# Patient Record
Sex: Female | Born: 1945 | Race: White | Hispanic: No | Marital: Single | Smoking: Never smoker
Health system: Southern US, Community
[De-identification: ages and names within clinical notes are randomized; demographics above are authoritative.]

## PROBLEM LIST (undated history)

## (undated) DIAGNOSIS — Z8669 Personal history of other diseases of the nervous system and sense organs: Secondary | ICD-10-CM

## (undated) DIAGNOSIS — M1990 Unspecified osteoarthritis, unspecified site: Secondary | ICD-10-CM

## (undated) DIAGNOSIS — T4145XA Adverse effect of unspecified anesthetic, initial encounter: Secondary | ICD-10-CM

## (undated) DIAGNOSIS — K5792 Diverticulitis of intestine, part unspecified, without perforation or abscess without bleeding: Secondary | ICD-10-CM

## (undated) DIAGNOSIS — G4733 Obstructive sleep apnea (adult) (pediatric): Secondary | ICD-10-CM

## (undated) DIAGNOSIS — Z780 Asymptomatic menopausal state: Secondary | ICD-10-CM

## (undated) DIAGNOSIS — K56600 Partial intestinal obstruction, unspecified as to cause: Secondary | ICD-10-CM

## (undated) DIAGNOSIS — K7460 Unspecified cirrhosis of liver: Secondary | ICD-10-CM

## (undated) DIAGNOSIS — J189 Pneumonia, unspecified organism: Secondary | ICD-10-CM

## (undated) DIAGNOSIS — M549 Dorsalgia, unspecified: Secondary | ICD-10-CM

## (undated) DIAGNOSIS — F329 Major depressive disorder, single episode, unspecified: Secondary | ICD-10-CM

## (undated) DIAGNOSIS — E119 Type 2 diabetes mellitus without complications: Secondary | ICD-10-CM

## (undated) DIAGNOSIS — E785 Hyperlipidemia, unspecified: Secondary | ICD-10-CM

## (undated) DIAGNOSIS — Q2112 Patent foramen ovale: Secondary | ICD-10-CM

## (undated) DIAGNOSIS — I10 Essential (primary) hypertension: Secondary | ICD-10-CM

## (undated) DIAGNOSIS — M109 Gout, unspecified: Secondary | ICD-10-CM

## (undated) DIAGNOSIS — K760 Fatty (change of) liver, not elsewhere classified: Secondary | ICD-10-CM

## (undated) DIAGNOSIS — Q211 Atrial septal defect: Secondary | ICD-10-CM

## (undated) DIAGNOSIS — C541 Malignant neoplasm of endometrium: Secondary | ICD-10-CM

## (undated) DIAGNOSIS — K469 Unspecified abdominal hernia without obstruction or gangrene: Secondary | ICD-10-CM

## (undated) DIAGNOSIS — T8859XA Other complications of anesthesia, initial encounter: Secondary | ICD-10-CM

## (undated) DIAGNOSIS — E559 Vitamin D deficiency, unspecified: Secondary | ICD-10-CM

## (undated) DIAGNOSIS — J45909 Unspecified asthma, uncomplicated: Secondary | ICD-10-CM

## (undated) DIAGNOSIS — T8149XA Infection following a procedure, other surgical site, initial encounter: Secondary | ICD-10-CM

## (undated) DIAGNOSIS — G4730 Sleep apnea, unspecified: Secondary | ICD-10-CM

## (undated) DIAGNOSIS — N950 Postmenopausal bleeding: Secondary | ICD-10-CM

## (undated) DIAGNOSIS — F32A Depression, unspecified: Secondary | ICD-10-CM

## (undated) HISTORY — PX: TUBAL LIGATION: SHX77

## (undated) HISTORY — DX: Depression, unspecified: F32.A

## (undated) HISTORY — DX: Unspecified asthma, uncomplicated: J45.909

## (undated) HISTORY — PX: TONSILLECTOMY: SUR1361

## (undated) HISTORY — DX: Asymptomatic menopausal state: Z78.0

## (undated) HISTORY — DX: Diverticulitis of intestine, part unspecified, without perforation or abscess without bleeding: K57.92

## (undated) HISTORY — PX: GALLBLADDER SURGERY: SHX652

## (undated) HISTORY — DX: Sleep apnea, unspecified: G47.30

## (undated) HISTORY — PX: APPENDECTOMY: SHX54

## (undated) HISTORY — PX: TOTAL KNEE ARTHROPLASTY: SHX125

## (undated) HISTORY — DX: Morbid (severe) obesity due to excess calories: E66.01

## (undated) HISTORY — DX: Major depressive disorder, single episode, unspecified: F32.9

## (undated) HISTORY — PX: EYE SURGERY: SHX253

## (undated) HISTORY — DX: Gout, unspecified: M10.9

## (undated) HISTORY — PX: CHOLECYSTECTOMY: SHX55

## (undated) HISTORY — DX: Obstructive sleep apnea (adult) (pediatric): G47.33

## (undated) HISTORY — DX: Essential (primary) hypertension: I10

## (undated) HISTORY — DX: Dorsalgia, unspecified: M54.9

## (undated) HISTORY — PX: NASAL SEPTUM SURGERY: SHX37

---
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
## 1972-07-16 HISTORY — PX: BREAST SURGERY: SHX581

## 1978-07-16 HISTORY — PX: GASTRIC BYPASS: SHX52

## 2004-01-08 ENCOUNTER — Emergency Department (HOSPITAL_COMMUNITY): Admission: EM | Admit: 2004-01-08 | Discharge: 2004-01-08 | Payer: Self-pay | Admitting: Emergency Medicine

## 2005-07-16 DIAGNOSIS — K5792 Diverticulitis of intestine, part unspecified, without perforation or abscess without bleeding: Secondary | ICD-10-CM

## 2005-07-16 HISTORY — DX: Diverticulitis of intestine, part unspecified, without perforation or abscess without bleeding: K57.92

## 2005-07-16 HISTORY — PX: COLON RESECTION: SHX5231

## 2006-06-24 ENCOUNTER — Emergency Department (HOSPITAL_COMMUNITY): Admission: EM | Admit: 2006-06-24 | Discharge: 2006-06-24 | Payer: Self-pay | Admitting: Emergency Medicine

## 2009-10-26 ENCOUNTER — Ambulatory Visit: Payer: Self-pay | Admitting: Family Medicine

## 2009-10-26 ENCOUNTER — Other Ambulatory Visit: Admission: RE | Admit: 2009-10-26 | Discharge: 2009-10-26 | Payer: Self-pay | Admitting: Family Medicine

## 2010-11-28 NOTE — Assessment & Plan Note (Signed)
Danielle Stephenson, Danielle Stephenson                  ACCOUNT NO.:  [id number]   MEDICAL RECORD NO.:  [id number]          PATIENT TYPE:  POB   LOCATION:  [facility]         FACILITY:  [facility]   PHYSICIAN:  Tinnie Gens, MD        DATE OF BIRTH:  Jul 12, 1946   DATE OF SERVICE:  10/26/2009                                  CLINIC NOTE   CHIEF COMPLAINT:  Yearly exam.   HISTORY OF PRESENT ILLNESS:  The patient is a 65 year old gravida 4,  para 2-0-2-2 who comes in today for a yearly checkup.  She has not been  seen for several years because she lost her job and lost her insurance.  She used to be a labor and delivery nurse at [employer]  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  The patient has a history significant for hypertension  which has been untreated for several months now.  She reports her blood  pressures normally are around the 120s to 130s over 70s.  She has been  menopausal since 1991 and has no postmenopausal bleeding.   PAST MEDICAL HISTORY:  Hypertension, asthma, allergic rhinitis,  diverticulitis, morbid obesity, depression.   PAST SURGICAL HISTORY:  She has had knee surgery to repair torn tendons  followed by total knee replacement on the left.  She had a gastric  bypass x2; one in the 70s, last one in 1980.  She had a colon resection  for diverticulitis with a major wound infection and wound dehiscence  following this in 2007.  She has also undergone bilateral breast  implants.   MEDICATIONS:  She is on gabapentin 300 mg p.o. daily when she remembers  to take it for depression.   ALLERGIES:  PENICILLIN, DEMEROL, TAPE, and LATEX SENSITIVITY.   OBSTETRICAL HISTORY:  She is gravida 4, para 2 with 2 vaginal  deliveries, one aged 1, she gave that child up for adoption and then  she has a 29 year old daughter.  She had one miscarriage between those 2  pregnancies and one termination after the last pregnancy.  She also  has  a adopted child who is 62 years old.   FAMILY HISTORY:  Coronary artery disease in her father and then her  grandmother and father both had throat cancer.  They are both smokers  and her mother had uterine cancer which required hysterectomy many years  ago.  Her mother is still alive.   SOCIAL HISTORY:  The patient works from home.  She is a Engineer, civil (consulting).  She does  not smoke or do any other drugs.   A 14 point review of system reviewed.   Please see GYN history in the chart, but is significant for weight gain,  although she reports poor diet, not working, has not helped with that,  trouble with her ears and her nose is related to allergies and problems  with shortness of breath and in fact, she thinks she might be having  panic attacks but she also is asthmatic and does not have an inhaler at  present.   PHYSICAL EXAMINATION:  GENERAL:  On exam  today, she is 300 plus pounds,  morbidly obese female in no acute distress.  VITAL SIGNS:  Blood pressure is 169/94 today, pulse is 81.  HEENT:  Normocephalic, atraumatic.  Sclerae anicteric.  NECK:  Supple.  Normal thyroid.  LUNGS:  Clear bilaterally.  CV:  Regular rate and rhythm. No rubs, gallops, or murmurs.  ABDOMEN:  Soft, nontender, nondistended, large, well-healed midline  incision noted.  BREASTS:  Symmetric with everted nipples.  No masses.  Her left breast  implant is somewhat firm and adherent to the overlying skin.  The right  implant is soft.  GU:  Normal external female genitalia.  BUS was normal.  Vagina pale  with loss of irrigation.  Cervix is parous without lesions.  Uterus and  adnexa cannot be adequately sized due to body habitus but essentially  was nontender.   IMPRESSION:  1. Gynecologic exam with Pap smear.  2. Hypertension.  3. Morbidly obese.  4. Asthma.  5. Depression.  6. Menopause.   PLAN:  1. We will refill her Benicar 40 with HCTZ 25 one p.o. daily,      gabapentin 300 mg 1 p.o. daily, and albuterol  MDI 2 puffs q.4-6      hours p.r.n., Pap smear today.  2. __________ mammogram scholarship.  The patient also probably      requires blood work given her age and history, but she is without      insurance and is unable to afford that today.  We will follow up as      needed.           ______________________________  Tinnie Gens, MD     TP/MEDQ  D:  10/26/2009  T:  10/27/2009  Job:  [id number]

## 2012-04-22 ENCOUNTER — Ambulatory Visit: Payer: Self-pay | Admitting: Family Medicine

## 2012-05-13 ENCOUNTER — Ambulatory Visit: Payer: Self-pay | Admitting: Family Medicine

## 2012-05-13 DIAGNOSIS — Z01419 Encounter for gynecological examination (general) (routine) without abnormal findings: Secondary | ICD-10-CM

## 2012-09-30 ENCOUNTER — Other Ambulatory Visit: Payer: Self-pay | Admitting: Family Medicine

## 2012-09-30 ENCOUNTER — Other Ambulatory Visit (HOSPITAL_COMMUNITY)
Admission: RE | Admit: 2012-09-30 | Discharge: 2012-09-30 | Disposition: A | Discharge status: Home / Self Care | Payer: BC Managed Care – PPO | Source: Ambulatory Visit | Attending: Family Medicine | Admitting: Family Medicine

## 2012-09-30 DIAGNOSIS — Z0000 Encounter for general adult medical examination without abnormal findings: Secondary | ICD-10-CM | POA: Insufficient documentation

## 2012-10-02 ENCOUNTER — Other Ambulatory Visit: Payer: Self-pay

## 2012-10-02 DIAGNOSIS — Z1231 Encounter for screening mammogram for malignant neoplasm of breast: Secondary | ICD-10-CM

## 2012-10-02 DIAGNOSIS — Z9882 Breast implant status: Secondary | ICD-10-CM

## 2012-10-21 ENCOUNTER — Ambulatory Visit
Admission: RE | Admit: 2012-10-21 | Discharge: 2012-10-21 | Disposition: A | Discharge status: Home / Self Care | Payer: BC Managed Care – PPO | Source: Ambulatory Visit

## 2012-10-21 DIAGNOSIS — Z1231 Encounter for screening mammogram for malignant neoplasm of breast: Secondary | ICD-10-CM

## 2012-10-21 DIAGNOSIS — Z9882 Breast implant status: Secondary | ICD-10-CM

## 2012-12-31 ENCOUNTER — Encounter: Payer: Self-pay | Admitting: Neurology

## 2012-12-31 ENCOUNTER — Ambulatory Visit (INDEPENDENT_AMBULATORY_CARE_PROVIDER_SITE_OTHER): Payer: BC Managed Care – PPO | Admitting: Neurology

## 2012-12-31 VITALS — BP 136/76 | HR 78 | Temp 98.7°F | Resp 17 | Ht 65.0 in | Wt 366.0 lb

## 2012-12-31 DIAGNOSIS — G4733 Obstructive sleep apnea (adult) (pediatric): Secondary | ICD-10-CM

## 2012-12-31 DIAGNOSIS — G4730 Sleep apnea, unspecified: Secondary | ICD-10-CM | POA: Insufficient documentation

## 2012-12-31 DIAGNOSIS — J45909 Unspecified asthma, uncomplicated: Secondary | ICD-10-CM

## 2012-12-31 DIAGNOSIS — R351 Nocturia: Secondary | ICD-10-CM
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
## 2012-12-31 NOTE — Patient Instructions (Signed)
Consider the 2 and 5 diet by Antonietta Barcelona  fExercise to Lose Weight Exercise and a healthy diet may help you lose weight. Your doctor may suggest specific exercises. EXERCISE IDEAS AND TIPS  Choose low-cost things you enjoy doing, such as walking, bicycling, or exercising to workout videos.  Take stairs instead of the elevator.  Walk during your lunch break.  Park your car further away from work or school.  Go to a gym or an exercise class.  Start with 5 to 10 minutes of exercise each day. Build up to 30 minutes of exercise 4 to 6 days a week.  Wear shoes with good support and comfortable clothes.  Stretch before and after working out.  Work out until you breathe harder and your heart beats faster.  Drink extra water when you exercise.  Do not do so much that you hurt yourself, feel dizzy, or get very short of breath. Exercises that burn about 150 calories:  Running 1  miles in 15 minutes.  Playing volleyball for 45 to 60 minutes.  Washing and waxing a car for 45 to 60 minutes.  Playing touch football for 45 minutes.  Walking 1  miles in 35 minutes.  Pushing a stroller 1  miles in 30 minutes.  Playing basketball for 30 minutes.  Raking leaves for 30 minutes.  Bicycling 5 miles in 30 minutes.  Walking 2 miles in 30 minutes.  Dancing for 30 minutes.  Shoveling snow for 15 minutes.  Swimming laps for 20 minutes.  Walking up stairs for 15 minutes.  Bicycling 4 miles in 15 minutes.  Gardening for 30 to 45 minutes.  Jumping rope for 15 minutes.  Washing windows or floors for 45 to 60 minutes. Document Released: 08/04/2010 Document Revised: 09/24/2011 Document Reviewed: 08/04/2010 [facility] Patient Information 2014 [place], [place]. or weight loss.   After you read titration study we will meet with an 8 weeks off the study a CPAP machine will be provided within days after the study. [facility] we'll download the data that also include a residual  AHI.  CPAP and BIPAP CPAP and BIPAP are methods of helping you breathe. CPAP stands for "continuous positive airway pressure." BIPAP stands for "bi-level positive airway pressure." Both CPAP and BIPAP are provided by a small machine with a flexible plastic tube that attaches to a plastic mask that goes over your nose or mouth. Air is blown into your air passages through your nose or mouth. This helps to keep your airways open and helps to keep you breathing well. The amount of pressure that is used to blow the air into your air passages can be set on the machine. The pressure setting is based on your needs. With CPAP, the amount of pressure stays the same while you breathe in and out. With BIPAP, the amount of pressure changes when you inhale and exhale. Your caregiver will recommend whether CPAP or BIPAP would be more helpful for you.  CPAP and BIPAP can be helpful for both adults and children with:  Sleep apnea.  Chronic Obstructive Pulmonary Disease (COPD), a condition like emphysema.  Diseases which weaken the muscles of the chest such as muscular dystrophy or neurological diseases.  Other problems that cause breathing to be weak or difficult. USE OF CPAP OR BIPAP The respiratory therapist or technician will help you get used to wearing the mask. Some people feel claustrophobic (a trapped or closed in feeling) at first, because the mask needs to be fairly snug  on your face.   It may help you to get used to the mask gradually, by first holding the mask loosely over your nose or mouth using a low pressure setting on the machine. Gradually the mask can be applied more snugly with increased pressure. You can also gradually increase the amount of time the mask is used.  People with sleep apnea will use the mask and machine at night when they are sleeping. Others, like those with ALS or other breathing difficulties, may need the CPAP or BIPAP all the time.  If the first mask you try does not fit  well, or is uncomfortable, there are other types and sizes that can be tried.  If you tend to breathe through your mouth, a chin strap may be applied to help keep your mouth closed (if you are using a nasal mask).  The CPAP and BIPAP machines have alarms that may sound if the mask comes off or develops a leak.  You should not eat or drink while the CPAP or BIPAP is on. Food or fluids could get pushed into your lungs by the pressure of the CPAP or BIPAP. Sometimes CPAP or BIPAP machines are ordered for home use. If you are going to use the CPAP or BIPAP machine at home, follow these instructions  CPAP or BIPAP machines can be rented or purchased through home health care companies. There are many different brands of machines available. If you rent a machine before purchasing you may find which particular machine works well for you.  Ask questions if there is something you do not understand when picking out your machine.  Place your CPAP or BIPAP machine on a secure table or stand near an electrical outlet.  Know where the On/Off switch is.  Follow your doctor's instructions for how to set the pressure on your machine and when you should use it.  Do not smoke! Tobacco smoke residue can damage the machine. SEEK IMMEDIATE MEDICAL CARE IF:   You have redness or open areas around your nose or mouth.  You have trouble operating the CPAP or BIPAP machine.  You cannot tolerate wearing the CPAP or BIPAP mask.  You have any questions or concerns. Document Released: 03/30/2004 Document Revised: 09/24/2011 Document Reviewed: 06/29/2008 [facility] Patient Information 2014 [place], [place]. Sleep Apnea Sleep apnea is disorder that affects a person's sleep. A person with sleep apnea has abnormal pauses in their breathing when they sleep. It is hard for them to get a good sleep. This makes a person tired during the day. It also can lead to other physical problems. There are three types of sleep apnea. One  type is when breathing stops for a short time because your airway is blocked (obstructive sleep apnea). Another type is when the brain sometimes fails to give the normal signal to breathe to the muscles that control your breathing (central sleep apnea). The third type is a combination of the other two types. HOME CARE  Do not sleep on your back. Try to sleep on your side.  Take all medicine as told by your doctor.  Avoid alcohol, calming medicines (sedatives), and depressant drugs.  Try to lose weight if you are overweight. Talk to your doctor about a healthy weight goal. Your doctor may have you use a device that helps to open your airway. It can help you get the air that you need. It is called a positive airway pressure (PAP) device. There are three types of PAP devices:  Continuous positive airway pressure (CPAP) device.  Nasal expiratory positive airway pressure (EPAP) device.  Bilevel positive airway pressure (BPAP) device. MAKE SURE YOU:  Understand these instructions.  Will watch your condition.  Will get help right away if you are not doing well or get worse. Document Released: 04/10/2008 Document Revised: 06/18/2012 Document Reviewed: 11/03/2011 [facility] Patient Information 2014 [place], [place].

## 2013-01-08 ENCOUNTER — Ambulatory Visit (INDEPENDENT_AMBULATORY_CARE_PROVIDER_SITE_OTHER): Payer: BC Managed Care – PPO | Admitting: Neurology

## 2013-01-08 DIAGNOSIS — J45909 Unspecified asthma, uncomplicated: Secondary | ICD-10-CM

## 2013-01-08 DIAGNOSIS — R351 Nocturia: Secondary | ICD-10-CM

## 2013-01-08 DIAGNOSIS — G4733 Obstructive sleep apnea (adult) (pediatric): Secondary | ICD-10-CM

## 2013-01-14 ENCOUNTER — Telehealth: Payer: Self-pay | Admitting: Neurology

## 2013-01-14 DIAGNOSIS — G4733 Obstructive sleep apnea (adult) (pediatric): Secondary | ICD-10-CM

## 2013-01-14 NOTE — Telephone Encounter (Signed)
Patient tolerated CPAP well at 11 cm water,  Currently using 15 - too much,  airfit P10 mask in standart size.  DME is [facility] with andy foster, who referred her .  Called her mobile at 21.30 hours, nobody picked up- please call her ASAP. 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  PCP wendy Mcneill. Marland Kitchen

## 2013-01-20 ENCOUNTER — Encounter: Payer: Self-pay | Admitting: *Deleted

## 2013-01-20 NOTE — Telephone Encounter (Signed)
Called patient to discuss sleep study results from  01/08/2013.  Discussed findings, recommendations and follow up care.  Patient understood well and all questions were answered.    New orders for new CPAP were forwarded to [facility], patient understands they will contact her to arrange for new equipment and supplies.    Copy of study will be sent to patient and also to Dr. [name].  Follow up appt was scheduled with Dr. Vickey Huger for 04/03/2013 at 9:00 AM. -sh

## 2013-01-20 NOTE — Progress Notes (Signed)
See media tab for full report  

## 2013-04-03 ENCOUNTER — Institutional Professional Consult (permissible substitution): Payer: BC Managed Care – PPO | Admitting: Neurology

## 2013-05-06 ENCOUNTER — Institutional Professional Consult (permissible substitution): Payer: BC Managed Care – PPO | Admitting: Neurology

## 2013-10-26 ENCOUNTER — Encounter: Payer: Self-pay | Admitting: Neurology

## 2013-10-27 ENCOUNTER — Encounter: Payer: Self-pay | Admitting: Neurology

## 2013-12-25 ENCOUNTER — Encounter: Payer: Self-pay | Admitting: Neurology

## 2013-12-25 ENCOUNTER — Ambulatory Visit (INDEPENDENT_AMBULATORY_CARE_PROVIDER_SITE_OTHER): Payer: BC Managed Care – PPO | Admitting: Neurology

## 2013-12-25 VITALS — BP 151/63 | HR 82 | Resp 18 | Ht 66.5 in | Wt 370.0 lb

## 2013-12-25 DIAGNOSIS — M109 Gout, unspecified: Secondary | ICD-10-CM

## 2013-12-25 DIAGNOSIS — G4733 Obstructive sleep apnea (adult) (pediatric): Secondary | ICD-10-CM | POA: Insufficient documentation

## 2013-12-25 DIAGNOSIS — Z9989 Dependence on other enabling machines and devices: Secondary | ICD-10-CM

## 2013-12-25 DIAGNOSIS — J45909 Unspecified asthma, uncomplicated: Secondary | ICD-10-CM | POA: Insufficient documentation

## 2013-12-25 NOTE — Patient Instructions (Signed)
Obesity Obesity is having too much body fat and a body mass index (BMI) of 30 or more. BMI is a number based on your height and weight. The number is an estimate of how much body fat you have. Obesity can happen if you eat more calories than you can burn by exercising or other activity. It can cause major health problems or emergencies.  HOME CARE  Exercise and be active as told by your doctor. Try:  Using stairs when you can.  Parking farther away from store doors.  Gardening, biking, or walking.  Eat healthy foods and drinks that are low in calories. Eat more fruits and vegetables.  Limit fast food, sweets, and snack foods that are made with ingredients that are not natural (processed food).  Eat smaller amounts of food.  Keep a journal and write down what you eat every day. Websites can help with this.  Avoid drinking alcohol. Drink more water and drinks without calories.   Take vitamins and dietary pills (supplements) only as told by your doctor.  Try going to weight-loss support groups or classes to help lessen stress. Dieticians and counselors may also help. GET HELP RIGHT AWAY IF:  You have chest pain or tightness.  You have trouble breathing or feel short of breath.  You feel weak or have loss of feeling (numbness) in your legs.  You feel confused or have trouble talking.  You have sudden changes in your vision. MAKE SURE YOU:  Understand these instructions.  Will watch your condition.  Will get help right away if you are not doing well or get worse. Document Released: 09/24/2011 Document Reviewed: 09/24/2011 [facility] Patient Information 2014 [place].
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
## 2014-02-04 ENCOUNTER — Other Ambulatory Visit: Payer: Self-pay | Admitting: Family Medicine

## 2014-02-04 ENCOUNTER — Ambulatory Visit
Admission: RE | Admit: 2014-02-04 | Discharge: 2014-02-04 | Disposition: A | Discharge status: Home / Self Care | Payer: BC Managed Care – PPO | Source: Ambulatory Visit | Attending: Family Medicine | Admitting: Family Medicine

## 2014-02-04 DIAGNOSIS — R1032 Left lower quadrant pain: Secondary | ICD-10-CM

## 2014-02-04 MED ORDER — IOHEXOL 300 MG/ML  SOLN
125.0000 mL | Freq: Once | INTRAMUSCULAR | Status: AC | PRN
  Administered 2014-02-04: 125 mL via INTRAVENOUS

## 2014-05-17 ENCOUNTER — Encounter: Payer: Self-pay | Admitting: Neurology

## 2014-11-04 ENCOUNTER — Other Ambulatory Visit: Payer: Self-pay | Admitting: Otolaryngology

## 2014-11-04 DIAGNOSIS — R42 Dizziness and giddiness: Secondary | ICD-10-CM

## 2014-11-25 ENCOUNTER — Ambulatory Visit
Admission: RE | Admit: 2014-11-25 | Discharge: 2014-11-25 | Disposition: A | Discharge status: Home / Self Care | Payer: Managed Care, Other (non HMO) | Source: Ambulatory Visit | Attending: Otolaryngology | Admitting: Otolaryngology

## 2014-11-25 DIAGNOSIS — R42 Dizziness and giddiness: Secondary | ICD-10-CM

## 2014-11-25 MED ORDER — GADOBENATE DIMEGLUMINE 529 MG/ML IV SOLN
20.0000 mL | Freq: Once | INTRAVENOUS | Status: AC | PRN
  Administered 2014-11-25: 20 mL via INTRAVENOUS

## 2014-11-29 ENCOUNTER — Telehealth: Payer: Self-pay | Admitting: Neurology

## 2014-11-29 NOTE — Telephone Encounter (Signed)
Called pt back. She said she has contacted her PCP Dr. Simeon Craft for a referral for her new symptoms of dizziness to see Dr. Brett Fairy. Encouraged her to call back with any more questions or concerns.

## 2014-11-29 NOTE — Telephone Encounter (Signed)
Patient called and requested to speak with the nurse regarding some issues she has been having (muscle pain, head aches, fatigue). Her PCP suggested that she should  set up an appt within the next few days with her neurologist if at all possible. Informed the patient that she would need a new referral since she is was last seen by Dr. Brett Fairy for sleep apnea, pt stated that she understood and would call her PCP to have a referral faxed. Please call and advise.

## 2014-11-30 ENCOUNTER — Telehealth: Payer: Self-pay

## 2014-11-30 NOTE — Telephone Encounter (Signed)
I spoke to patient to change her appt. She is established Dr. Brett Fairy patient and was put on Dr. Guadelupe Sabin schedule. Patient aware of schedule change.

## 2014-12-01 ENCOUNTER — Institutional Professional Consult (permissible substitution): Payer: Managed Care, Other (non HMO) | Admitting: Neurology

## 2014-12-02 ENCOUNTER — Telehealth: Payer: Self-pay | Admitting: Neurology

## 2014-12-02 ENCOUNTER — Telehealth: Payer: Self-pay

## 2014-12-02 NOTE — Telephone Encounter (Signed)
Patient wants sooner appt with Dr. Brett Fairy. She has appt for 6/6, there has been a couple of openings. I left message to call back for sooner appt.

## 2014-12-02 NOTE — Telephone Encounter (Signed)
Pt called back for sooner appt she was unable to take what was open she will keep the appt for 12/20/14 dg

## 2014-12-02 NOTE — Telephone Encounter (Signed)
I spoke to patient but times available, pt already had other appts.

## 2014-12-02 NOTE — Telephone Encounter (Signed)
I spoke to patient but times that I had available, patient already had other appts.

## 2014-12-14 ENCOUNTER — Encounter: Payer: Self-pay | Admitting: Neurology

## 2014-12-20 ENCOUNTER — Institutional Professional Consult (permissible substitution): Payer: Medicare Other | Admitting: Neurology

## 2014-12-20 ENCOUNTER — Ambulatory Visit (INDEPENDENT_AMBULATORY_CARE_PROVIDER_SITE_OTHER): Payer: Medicare Other | Admitting: Neurology

## 2014-12-20 ENCOUNTER — Encounter: Payer: Self-pay | Admitting: Neurology

## 2014-12-20 VITALS — BP 158/100 | HR 86 | Resp 20 | Ht 65.35 in | Wt 367.0 lb

## 2014-12-20 DIAGNOSIS — Z6841 Body Mass Index (BMI) 40.0 and over, adult: Secondary | ICD-10-CM

## 2014-12-20 DIAGNOSIS — R930 Abnormal findings on diagnostic imaging of skull and head, not elsewhere classified: Secondary | ICD-10-CM

## 2014-12-20 DIAGNOSIS — H8110 Benign paroxysmal vertigo, unspecified ear: Secondary | ICD-10-CM | POA: Insufficient documentation

## 2014-12-20 DIAGNOSIS — G43109 Migraine with aura, not intractable, without status migrainosus: Secondary | ICD-10-CM | POA: Insufficient documentation

## 2014-12-20 DIAGNOSIS — H8112 Benign paroxysmal vertigo, left ear: Secondary | ICD-10-CM

## 2014-12-20 DIAGNOSIS — R110 Nausea: Secondary | ICD-10-CM

## 2014-12-20 MED ORDER — ASPIRIN 75 MG PO CHEW: 75.0000 mg | CHEWABLE_TABLET | Freq: Every day | ORAL | Status: DC

## 2014-12-20 MED ORDER — TOPIRAMATE 25 MG PO TABS: 25.0000 mg | ORAL_TABLET | Freq: Two times a day (BID) | ORAL | Status: DC
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
## 2014-12-20 NOTE — Addendum Note (Signed)
Addended by: Larey Seat on: 12/20/2014 10:28 AM   Modules accepted: Orders

## 2014-12-20 NOTE — Patient Instructions (Signed)
Obesity Obesity is having too much body fat and a body mass index (BMI) of 30 or more. BMI is a number based on your height and weight. The number is an estimate of how much body fat you have. Obesity can happen if you eat more calories than you can burn by exercising or other activity. It can cause major health problems or emergencies.  HOME CARE  Exercise and be active as told by your doctor. Try:  Using stairs when you can.  Parking farther away from store doors.  Gardening, biking, or walking.  Eat healthy foods and drinks that are low in calories. Eat more fruits and vegetables.  Limit fast food, sweets, and snack foods that are made with ingredients that are not natural (processed food).  Eat smaller amounts of food.  Keep a journal and write down what you eat every day. Websites can help with this.  Avoid drinking alcohol. Drink more water and drinks without calories.   Take vitamins and dietary pills (supplements) only as told by your doctor.  Try going to weight-loss support groups or classes to help lessen stress. Dietitians and counselors may also help. GET HELP RIGHT AWAY IF:  You have chest pain or tightness.  You have trouble breathing or feel short of breath.  You feel weak or have loss of feeling (numbness) in your legs.  You feel confused or have trouble talking.  You have sudden changes in your vision. MAKE SURE YOU:  Understand these instructions.  Will watch your condition.  Will get help right away if you are not doing well or get worse. Document Released: 09/24/2011 Document Revised: 11/16/2013 Document Reviewed: 09/24/2011 [facility] Patient Information 2015 [place], [place]. This information is not intended to replace advice given to you by your health care provider. Make sure you discuss any questions you have with your health care provider.

## 2014-12-23 ENCOUNTER — Telehealth: Payer: Self-pay

## 2014-12-23 DIAGNOSIS — R930 Abnormal findings on diagnostic imaging of skull and head, not elsewhere classified: Secondary | ICD-10-CM

## 2014-12-23 DIAGNOSIS — R110 Nausea: Secondary | ICD-10-CM

## 2014-12-23 DIAGNOSIS — Z6841 Body Mass Index (BMI) 40.0 and over, adult: Secondary | ICD-10-CM

## 2014-12-23 DIAGNOSIS — G43109 Migraine with aura, not intractable, without status migrainosus: Secondary | ICD-10-CM

## 2014-12-23 MED ORDER — TOPIRAMATE 25 MG PO TABS: 25.0000 mg | ORAL_TABLET | Freq: Every day | ORAL | Status: DC

## 2014-12-23 NOTE — Telephone Encounter (Signed)
Spoke to pt re: topamax causing muscle aches. Dr. Brett Fairy recommended taking topamax 25 mg qhs instead of BID. She also recommended that the pt go to her PCP and get her sodium and potassium levels checked. Pt verbalized understanding.

## 2014-12-23 NOTE — Telephone Encounter (Signed)
Spoke with patient to schedule TCD with [facility], schedule for 12/28/2014 @2p  at [facility] .  Patient verbalized understanding.

## 2014-12-23 NOTE — Telephone Encounter (Signed)
Pt called to inform Dr. Brett Fairy that the topamax prescribed at her last visit has made her feel like she has been "beaten with a baseball bat all over". She wants to know what to do or if there is another medication to switch to. Will confer with Dr. Brett Fairy and call the pt back.

## 2014-12-27 ENCOUNTER — Ambulatory Visit: Payer: BC Managed Care – PPO | Admitting: Adult Health

## 2014-12-28 ENCOUNTER — Ambulatory Visit (HOSPITAL_COMMUNITY)
Admission: RE | Admit: 2014-12-28 | Discharge: 2014-12-28 | Disposition: A | Discharge status: Home / Self Care | Payer: Managed Care, Other (non HMO) | Source: Ambulatory Visit | Attending: Neurology | Admitting: Neurology

## 2014-12-28 DIAGNOSIS — R42 Dizziness and giddiness: Secondary | ICD-10-CM | POA: Insufficient documentation

## 2014-12-28 DIAGNOSIS — H8112 Benign paroxysmal vertigo, left ear: Secondary | ICD-10-CM

## 2014-12-28 DIAGNOSIS — G43109 Migraine with aura, not intractable, without status migrainosus: Secondary | ICD-10-CM | POA: Diagnosis not present

## 2014-12-28 DIAGNOSIS — R930 Abnormal findings on diagnostic imaging of skull and head, not elsewhere classified: Secondary | ICD-10-CM

## 2014-12-28 DIAGNOSIS — R110 Nausea: Secondary | ICD-10-CM

## 2014-12-28 DIAGNOSIS — Z6841 Body Mass Index (BMI) 40.0 and over, adult: Secondary | ICD-10-CM

## 2014-12-28 NOTE — Progress Notes (Signed)
*  PRELIMINARY RESULTS* Vascular Ultrasound Transcranial Doppler with Bubbles has been completed.   The right middle cerebral artery was studied. Verbal consent was taken and risks and benefits were explained. An IV was inserted into the right forearm by the nurse. Dr. Leonie Man performed the procedure.   High Intensity Transient Signals (HITS) were heard during valsalva, indicating a small PFO.  12/28/2014 4:02 PM Maudry Mayhew, RVT, RDCS, RDMS

## 2014-12-31 ENCOUNTER — Telehealth: Payer: Self-pay | Admitting: Neurology

## 2014-12-31 NOTE — Telephone Encounter (Signed)
Patient is calling in regard to paperwork for her long term disability.  The [employer] faxed over paperwork to be filled out and sent back on 6/14. Contact for the [facility] is [place] Lao [phone]. Please call patient back ASAP.

## 2015-01-03 ENCOUNTER — Telehealth: Payer: Self-pay | Admitting: *Deleted

## 2015-01-03 DIAGNOSIS — Z0289 Encounter for other administrative examinations: Secondary | ICD-10-CM

## 2015-01-03 MED ORDER — DIVALPROEX SODIUM 500 MG PO DR TAB: 500.0000 mg | DELAYED_RELEASE_TABLET | Freq: Every day | ORAL | Status: DC

## 2015-01-03 NOTE — Telephone Encounter (Signed)
Paper work complete, sent to MR.

## 2015-01-03 NOTE — Telephone Encounter (Signed)
Called Danielle Stephenson at [facility]. She says that they have already sent in the payment for the records to [place]. I asked her to please fax over the paperwork that needs to be filled out to [phone].

## 2015-01-03 NOTE — Telephone Encounter (Signed)
Paper work complete. Sent to MR.

## 2015-01-03 NOTE — Telephone Encounter (Signed)
Form,[employer] sent to [facility] and Dr Brett Fairy 01/03/15.

## 2015-01-04 ENCOUNTER — Telehealth: Payer: Self-pay | Admitting: *Deleted

## 2015-01-04 NOTE — Telephone Encounter (Signed)
Form,[employer] received,completed by Dr Brett Fairy and Cyril Mourning faxed 01/04/15.

## 2015-01-07 ENCOUNTER — Ambulatory Visit: Payer: Managed Care, Other (non HMO) | Admitting: Rehabilitative and Restorative Service Providers"

## 2015-02-01 ENCOUNTER — Telehealth: Payer: Self-pay

## 2015-02-01 ENCOUNTER — Encounter: Payer: Self-pay | Admitting: Adult Health

## 2015-02-01 ENCOUNTER — Ambulatory Visit (INDEPENDENT_AMBULATORY_CARE_PROVIDER_SITE_OTHER): Payer: Self-pay | Admitting: Adult Health

## 2015-02-01 VITALS — BP 127/83 | HR 80 | Ht 65.0 in | Wt 387.0 lb

## 2015-02-01 DIAGNOSIS — R42 Dizziness and giddiness: Secondary | ICD-10-CM

## 2015-02-01 DIAGNOSIS — R519 Headache, unspecified: Secondary | ICD-10-CM

## 2015-02-01 DIAGNOSIS — R51 Headache: Secondary | ICD-10-CM
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
## 2015-02-01 NOTE — Patient Instructions (Signed)
Stop Topmax Wean off Depakote. Decrease to 1 tablet every other day for 1 week then 1 tablet every 2 days then 1 tablet every 3 days for 1 week then stop.  Follow-up with Dr. Jacelyn Grip

## 2015-02-01 NOTE — Telephone Encounter (Signed)
Called and left patient a message asking her for the fax number . If Patient call's please get a fax number from her. Thanks Hinton Dyer.

## 2015-02-02 NOTE — Progress Notes (Signed)
I agree with the assessment and plan as directed by NP .The patient is known to me .   Huntleigh Doolen, MD  

## 2015-02-03 ENCOUNTER — Telehealth: Payer: Self-pay | Admitting: Adult Health

## 2015-02-03 DIAGNOSIS — R42 Dizziness and giddiness: Secondary | ICD-10-CM

## 2015-02-03 NOTE — Telephone Encounter (Signed)
Called and left patient a message that [facility] Dr. Edwena Felty nurse will give her a call 02/07/2015. With process of work note. Also relayed if she could not wait until Monday 02/07/2015 she could ask for Melina Copa RN and she could help with letter.

## 2015-02-03 NOTE — Telephone Encounter (Signed)
Pt called and states that she needs a note to take her out of work until her appt date with the Cardiologist. They have not set an appt up with the cardiologist yet. Supposed to return to work on July 25th, just needs to extend the length of time. Please call and advise [phone].

## 2015-02-03 NOTE — Telephone Encounter (Signed)
Hinton Dyer, I put the referral in. Please try to get her an appointment. Please make sure she gets a call from our office explaining her treatment plan.

## 2015-02-03 NOTE — Telephone Encounter (Signed)
This patient was referred back to her PCP regarding new symptoms of dizziness, nausea and headaches per Dr. Brett Fairy. In the office visit I explained to the patient that she may need a cardiology referral to rule out cardiac origin for dizziness as Dr. Brett Fairy was not sure that it was from a neurologic orgin. Dr. Addison Lank called today. She has not been a part of this process and would feel more comfortable if we made a referral to cardiology. I do not mind making a referral to cardiology to rule out causes of dizziness. I explained to Dr. Addison Lank that I'll have Dr. Brett Fairy call and speak to the patient regarding ongoing plan of care.

## 2015-02-03 NOTE — Telephone Encounter (Signed)
Cardiac evaluation to rule out vertigo of cardiac origin. Dr Einar Gip.

## 2015-02-03 NOTE — Telephone Encounter (Signed)
Advised to speak to Education officer, museum. Her condition has not improved, she is unable to drive. Written out of work for 12 days until dr. Milderd Meager results are back.

## 2015-02-03 NOTE — Telephone Encounter (Signed)
Patient has apt. With Dr.Ganji today at 2:00 pm 02/03/2015.

## 2015-02-03 NOTE — Telephone Encounter (Signed)
Called Dr. Einar Gip office and Dr. Einar Gip had a  CX today at 02/03/2015 wanted patient to arrive at 2:00. Faxed all records to [phone]. Called and spoke to patient at 12:30 pm and patient stated she could go to the apt. Patient is requesting a call from Dr. Brett Fairy. Today.

## 2015-02-07 NOTE — Telephone Encounter (Signed)
Pt requesting that her work note be faxed to [place]: [name] or [name] at [phone]. I faxed the signed work note to that number per pt request. Pt reports that she is going to see a Education officer, museum and they are going to try and figure out what to do regarding the pt returning to work.

## 2015-02-09 NOTE — Telephone Encounter (Signed)
Dr Einar Gip thinks the PFO is a possible contributor , he recommended weight loss first, than further evaluation. She will need a note to be out of work for persistent vertigo.

## 2015-02-10 ENCOUNTER — Telehealth: Payer: Self-pay | Admitting: Neurology

## 2015-02-10 NOTE — Telephone Encounter (Signed)
Pt called and wants to speak with Dr. About her appeals, would she be able to write a letter? Letter needs: what is going on , how long she will be doing treatments, what type of treatments are going on, etc. Please call and advise [phone]

## 2015-02-14 NOTE — Telephone Encounter (Signed)
Dr. Brett Fairy has provided a letter for pt's disability application. I called pt to relay this information. No answer, left a message asking her to call me back at her convenience.

## 2015-02-14 NOTE — Telephone Encounter (Signed)
Requested Dr. . Irven Shelling notes from pt's appt with him last week. Will show to Dr. Brett Fairy and ask her for a letter for the pt's disability.

## 2015-02-14 NOTE — Telephone Encounter (Signed)
Pt is calling back, she missed Kristen's phone call

## 2015-02-15 NOTE — Telephone Encounter (Signed)
Spoke to pt, she wants a work note. Dr. Brett Fairy agreed to a out of work note until 8/21, the day before she returns to Dr. Einar Gip. She wants it faxed to [facility] at [facility] office [phone].  She wants the disability letter faxed to Joie Bimler at the [facility] at [phone].  Will fax both letters now. Pt verbalized understanding.

## 2015-02-21 ENCOUNTER — Ambulatory Visit: Payer: Self-pay | Admitting: Adult Health

## 2015-12-08 ENCOUNTER — Other Ambulatory Visit: Payer: Self-pay | Admitting: Family Medicine

## 2015-12-08 DIAGNOSIS — Z1231 Encounter for screening mammogram for malignant neoplasm of breast: Secondary | ICD-10-CM

## 2016-02-03 ENCOUNTER — Other Ambulatory Visit: Payer: Self-pay | Admitting: Nurse Practitioner

## 2016-02-03 DIAGNOSIS — E2839 Other primary ovarian failure: Secondary | ICD-10-CM

## 2016-02-28 ENCOUNTER — Ambulatory Visit: Payer: Medicare Other

## 2016-02-29 ENCOUNTER — Ambulatory Visit: Payer: Medicare Other

## 2016-03-22 ENCOUNTER — Other Ambulatory Visit: Payer: Medicare Other

## 2016-03-22 ENCOUNTER — Ambulatory Visit: Payer: Medicare Other

## 2016-07-25 ENCOUNTER — Ambulatory Visit
Admission: RE | Admit: 2016-07-25 | Discharge: 2016-07-25 | Disposition: A | Discharge status: Home / Self Care | Payer: Medicare Other | Source: Ambulatory Visit | Attending: Nurse Practitioner | Admitting: Nurse Practitioner

## 2016-07-25 ENCOUNTER — Ambulatory Visit
Admission: RE | Admit: 2016-07-25 | Discharge: 2016-07-25 | Disposition: A | Discharge status: Home / Self Care | Payer: Medicare Other | Source: Ambulatory Visit | Attending: Family Medicine | Admitting: Family Medicine

## 2016-07-25 ENCOUNTER — Other Ambulatory Visit: Payer: Self-pay | Admitting: Family Medicine

## 2016-07-25 DIAGNOSIS — Z1231 Encounter for screening mammogram for malignant neoplasm of breast: Secondary | ICD-10-CM

## 2016-07-25 DIAGNOSIS — E2839 Other primary ovarian failure: Secondary | ICD-10-CM

## 2017-06-26 ENCOUNTER — Other Ambulatory Visit: Payer: Self-pay | Admitting: Orthopedic Surgery

## 2017-06-26 DIAGNOSIS — M25511 Pain in right shoulder: Secondary | ICD-10-CM

## 2017-07-05 ENCOUNTER — Inpatient Hospital Stay
Admission: RE | Admit: 2017-07-05 | Discharge: 2017-07-05 | Disposition: A | Discharge status: Home / Self Care | Payer: Medicare Other | Source: Ambulatory Visit | Attending: Orthopedic Surgery | Admitting: Orthopedic Surgery

## 2017-07-19 ENCOUNTER — Ambulatory Visit
Admission: RE | Admit: 2017-07-19 | Discharge: 2017-07-19 | Disposition: A | Discharge status: Home / Self Care | Payer: Medicare Other | Source: Ambulatory Visit | Attending: Orthopedic Surgery | Admitting: Orthopedic Surgery

## 2017-07-19 DIAGNOSIS — M25511 Pain in right shoulder: Secondary | ICD-10-CM

## 2017-08-01 ENCOUNTER — Other Ambulatory Visit: Payer: Self-pay | Admitting: Orthopedic Surgery

## 2017-08-22 ENCOUNTER — Other Ambulatory Visit: Payer: Self-pay | Admitting: Family Medicine

## 2017-08-22 DIAGNOSIS — Z139 Encounter for screening, unspecified: Secondary | ICD-10-CM

## 2017-08-22 NOTE — Pre-Procedure Instructions (Signed)
DEYANI HEGARTY  08/22/2017      [id number] [address], [place] [address] Phone: [phone] Fax: [phone]    Your procedure is scheduled on Thursday, August 29, 2017  Report to [facility] Admitting Entrance "A"at 8:30AM   Call this number if you have problems the morning of surgery:  [phone]   Remember:  Do not eat food or drink liquids after midnight.  Take these medicines the morning of surgery with A SIP OF WATER: AmLODipine (NORVASC), Citalopram (CELEXA), and Topiramate (TOPAMAX). If needed TraMADol (ULTRAM) for pain, Dicyclomine (BENTYL) for stomach pain, and Albuterol Inhaler for cough or wheezing (Bring with you the day of surgery).  As of today, stop taking all Aspirins, Vitamins, Fish oils, and Herbal medications. Also stop all NSAIDS i.e. Advil, Ibuprofen, Motrin, Aleve, Anaprox, Naproxen, BC and Goody Powders. Including: Meloxicam (MOBIC)  How to Manage Your Diabetes Before and After Surgery  Why is it important to control my blood sugar before and after surgery? . Improving blood sugar levels before and after surgery helps healing and can limit problems. . A way of improving blood sugar control is eating a healthy diet by: o  Eating less sugar and carbohydrates o  Increasing activity/exercise o  Talking with your doctor about reaching your blood sugar goals . High blood sugars (greater than 180 mg/dL) can raise your risk of infections and slow your recovery, so you will need to focus on controlling your diabetes during the weeks before surgery. . Make sure that the doctor who takes care of your diabetes knows about your planned surgery including the date and location.  How do I manage my blood sugar before surgery? . Check your blood sugar at least 4 times a day, starting 2 days before surgery, to make sure that the level is not too high or low. o Check your blood sugar the morning of  your surgery when you wake up and every 2 hours until you get to the Short Stay unit. . If your blood sugar is less than 70 mg/dL, you will need to treat for low blood sugar: o Do not take insulin. o Treat a low blood sugar (less than 70 mg/dL) with  cup of clear juice (cranberry or apple), 4 glucose tablets, OR glucose gel. Recheck blood sugar in 15 minutes after treatment (to make sure it is greater than 70 mg/dL). If your blood sugar is not greater than 70 mg/dL on recheck, call [phone] o  for further instructions. . Report your blood sugar to the short stay nurse when you get to Short Stay.  . If you are admitted to the hospital after surgery: o Your blood sugar will be checked by the staff and you will probably be given insulin after surgery (instead of oral diabetes medicines) to make sure you have good blood sugar levels. o The goal for blood sugar control after surgery is 80-180 mg/dL.  WHAT DO I DO ABOUT MY DIABETES MEDICATION?  Marland Kitchen The day of surgery, do not take other diabetes injectable Victoza (liraglutide).  . If your CBG is greater than 220 mg/dL, call us at [phone]   Do not wear jewelry, make-up or nail polish.  Do not wear lotions, powders, or perfumes, or deodorant.  Do not shave 48 hours prior to surgery.  Men may shave face and neck.  Do not bring valuables to the hospital.  [facility] is not responsible for  any belongings or valuables.  Contacts, dentures or bridgework may not be worn into surgery.  Leave your suitcase in the car.  After surgery it may be brought to your room.  For patients admitted to the hospital, discharge time will be determined by your treatment team.  Patients discharged the day of surgery will not be allowed to drive home.   Special instructions: [facility]- Preparing For Surgery  Before surgery, you can play an important role. Because skin is not sterile, your skin needs to be as free of germs as possible. You can reduce the  number of germs on your skin by washing with CHG (chlorahexidine gluconate) Soap before surgery.  CHG is an antiseptic cleaner which kills germs and bonds with the skin to continue killing germs even after washing.  Please do not use if you have an allergy to CHG or antibacterial soaps. If your skin becomes reddened/irritated stop using the CHG.  Do not shave (including legs and underarms) for at least 48 hours prior to first CHG shower. It is OK to shave your face.  Please follow these instructions carefully.   1. Shower the NIGHT BEFORE SURGERY and the MORNING OF SURGERY with CHG.   2. If you chose to wash your hair, wash your hair first as usual with your normal shampoo.  3. After you shampoo, rinse your hair and body thoroughly to remove the shampoo.  4. Use CHG as you would any other liquid soap. You can apply CHG directly to the skin and wash gently with a scrungie or a clean washcloth.   5. Apply the CHG Soap to your body ONLY FROM THE NECK DOWN.  Do not use on open wounds or open sores. Avoid contact with your eyes, ears, mouth and genitals (private parts). Wash Face and genitals (private parts)  with your normal soap.  6. Wash thoroughly, paying special attention to the area where your surgery will be performed.  7. Thoroughly rinse your body with warm water from the neck down.  8. DO NOT shower/wash with your normal soap after using and rinsing off the CHG Soap.  9. Pat yourself dry with a CLEAN TOWEL.  10. Wear CLEAN PAJAMAS to bed the night before surgery, wear comfortable clothes the morning of surgery  11. Place CLEAN SHEETS on your bed the night of your first shower and DO NOT SLEEP WITH PETS.  Day of Surgery: Do not apply any deodorants/lotions. Please wear clean clothes to the hospital/surgery center.    Please read over the following fact sheets that you were given. Pain Booklet, Coughing and Deep Breathing, Total Joint Packet, MRSA Information and Surgical Site  Infection Prevention

## 2017-08-23 ENCOUNTER — Other Ambulatory Visit: Payer: Self-pay | Admitting: Orthopedic Surgery

## 2017-08-23 ENCOUNTER — Encounter (HOSPITAL_COMMUNITY): Payer: Self-pay

## 2017-08-23 ENCOUNTER — Encounter (HOSPITAL_COMMUNITY)
Admission: RE | Admit: 2017-08-23 | Discharge: 2017-08-23 | Disposition: A | Discharge status: Home / Self Care | Payer: Medicare Other | Source: Ambulatory Visit | Attending: Orthopedic Surgery | Admitting: Orthopedic Surgery

## 2017-08-23 DIAGNOSIS — F329 Major depressive disorder, single episode, unspecified: Secondary | ICD-10-CM | POA: Diagnosis not present

## 2017-08-23 DIAGNOSIS — J45909 Unspecified asthma, uncomplicated: Secondary | ICD-10-CM | POA: Diagnosis not present

## 2017-08-23 DIAGNOSIS — Z7984 Long term (current) use of oral hypoglycemic drugs: Secondary | ICD-10-CM | POA: Diagnosis not present

## 2017-08-23 DIAGNOSIS — G4733 Obstructive sleep apnea (adult) (pediatric): Secondary | ICD-10-CM | POA: Insufficient documentation

## 2017-08-23 DIAGNOSIS — Z6841 Body Mass Index (BMI) 40.0 and over, adult: Secondary | ICD-10-CM | POA: Diagnosis not present

## 2017-08-23 DIAGNOSIS — Z79899 Other long term (current) drug therapy: Secondary | ICD-10-CM | POA: Insufficient documentation

## 2017-08-23 DIAGNOSIS — E119 Type 2 diabetes mellitus without complications: Secondary | ICD-10-CM | POA: Diagnosis not present

## 2017-08-23 DIAGNOSIS — Z01812 Encounter for preprocedural laboratory examination: Secondary | ICD-10-CM | POA: Diagnosis not present

## 2017-08-23 DIAGNOSIS — M1990 Unspecified osteoarthritis, unspecified site: Secondary | ICD-10-CM | POA: Diagnosis not present

## 2017-08-23 DIAGNOSIS — I10 Essential (primary) hypertension: Secondary | ICD-10-CM | POA: Insufficient documentation

## 2017-08-23 HISTORY — DX: Atrial septal defect: Q21.1

## 2017-08-23 HISTORY — DX: Type 2 diabetes mellitus without complications: E11.9

## 2017-08-23 HISTORY — DX: Pneumonia, unspecified organism: J18.9

## 2017-08-23 HISTORY — DX: Unspecified osteoarthritis, unspecified site: M19.90

## 2017-08-23 HISTORY — DX: Patent foramen ovale: Q21.12

## 2017-08-23 LAB — URINALYSIS, ROUTINE W REFLEX MICROSCOPIC
BILIRUBIN URINE: NEGATIVE
GLUCOSE, UA: NEGATIVE mg/dL
KETONES UR: NEGATIVE mg/dL
NITRITE: NEGATIVE
PH: 5 (ref 5.0–8.0)
Protein, ur: NEGATIVE mg/dL
Specific Gravity, Urine: 1.016 (ref 1.005–1.030)

## 2017-08-23 LAB — CBC WITH DIFFERENTIAL/PLATELET
BASOS PCT: 0 %
Basophils Absolute: 0 10*3/uL (ref 0.0–0.1)
Eosinophils Absolute: 0.4 10*3/uL (ref 0.0–0.7)
Eosinophils Relative: 4 %
HEMATOCRIT: 43.8 % (ref 36.0–46.0)
HEMOGLOBIN: 14.8 g/dL (ref 12.0–15.0)
LYMPHS ABS: 2.4 10*3/uL (ref 0.7–4.0)
LYMPHS PCT: 26 %
MCH: 30.2 pg (ref 26.0–34.0)
MCHC: 33.8 g/dL (ref 30.0–36.0)
MCV: 89.4 fL (ref 78.0–100.0)
Monocytes Absolute: 0.4 10*3/uL (ref 0.1–1.0)
Monocytes Relative: 4 %
NEUTROS ABS: 6.2 10*3/uL (ref 1.7–7.7)
NEUTROS PCT: 66 %
Platelets: 327 10*3/uL (ref 150–400)
RBC: 4.9 MIL/uL (ref 3.87–5.11)
RDW: 13.1 % (ref 11.5–15.5)
WBC: 9.4 10*3/uL (ref 4.0–10.5)

## 2017-08-23 LAB — COMPREHENSIVE METABOLIC PANEL
ALBUMIN: 3.9 g/dL (ref 3.5–5.0)
ALK PHOS: 70 U/L (ref 38–126)
ALT: 20 U/L (ref 14–54)
ANION GAP: 13 (ref 5–15)
AST: 24 U/L (ref 15–41)
BILIRUBIN TOTAL: 0.8 mg/dL (ref 0.3–1.2)
BUN: 15 mg/dL (ref 6–20)
CALCIUM: 9.6 mg/dL (ref 8.9–10.3)
CO2: 19 mmol/L — ABNORMAL LOW (ref 22–32)
Chloride: 108 mmol/L (ref 101–111)
Creatinine, Ser: 0.72 mg/dL (ref 0.44–1.00)
GFR calc Af Amer: >60 mL/min (ref >60)
GFR calc non Af Amer: >60 mL/min (ref >60)
GLUCOSE: 103 mg/dL — AB (ref 65–99)
Potassium: 4.2 mmol/L (ref 3.5–5.1)
Sodium: 140 mmol/L (ref 135–145)
TOTAL PROTEIN: 7.9 g/dL (ref 6.5–8.1)

## 2017-08-23 LAB — PROTIME-INR
INR: 1.07
Prothrombin Time: 13.9 seconds (ref 11.4–15.2)

## 2017-08-23 LAB — TYPE AND SCREEN
ABO/RH(D): O POS
Antibody Screen: NEGATIVE

## 2017-08-23 LAB — SURGICAL PCR SCREEN
MRSA, PCR: POSITIVE — AB
Staphylococcus aureus: POSITIVE — AB

## 2017-08-23 LAB — ABO/RH: ABO/RH(D): O POS

## 2017-08-23 LAB — APTT: aPTT: 32 seconds (ref 24–36)

## 2017-08-23 LAB — GLUCOSE, CAPILLARY: GLUCOSE-CAPILLARY: 101 mg/dL — AB (ref 65–99)

## 2017-08-23 NOTE — Progress Notes (Signed)
PCP - Tobie Lords, NP at [facility] in [place] 08/02/17 Cardiologist - Dr. [facility] - Cardiac Clearance in [place] 08/13/17  Chest x-ray - 07/17/17 EKG - 08/13/17 - tracing requested from Dr. Carlis Abbott Stress Test - patient denies ECHO - 2007 but patient unsure Cardiac Cath - patient denies  Sleep Study - 01/08/2013 in Epic CPAP - yes  Fasting Blood Sugar - 90-100's Checks Blood Sugar 1 time a day or every other day A1C was 5.8 on 08/02/17   Anesthesia review: yes, hx of PFO (found on [place] transcranial doppler with bubbles by Dr. Leonie Man in 2016, in [place])  Patient denies shortness of breath, fever, cough and chest pain at PAT appointment   Patient verbalized understanding of instructions that were given to them at the PAT appointment. Patient was also instructed that they will need to review over the PAT instructions again at home before surgery.

## 2017-08-23 NOTE — Pre-Procedure Instructions (Signed)
NIKITA SURMAN  08/23/2017      [id number] [address], [place] [address] Phone: [phone] Fax: [phone]    Your procedure is scheduled on Thursday, August 29, 2017  Report to [facility] Admitting Entrance "A"at 8:30AM   Call this number if you have problems the morning of surgery:  [phone]   Remember:  Do not eat food or drink liquids after midnight.   Continue all medications as directed by your physician except follow these medication instructions before surgery below   Take these medicines the morning of surgery with A SIP OF WATER: AmLODipine (NORVASC) Citalopram (CELEXA) Topiramate (TOPAMAX) If needed: TraMADol (ULTRAM) for pain, Dicyclomine (BENTYL) for stomach pain Albuterol Inhaler for cough or wheezing (Bring with you the day of surgery).  As of today, stop taking all Aspirins, Vitamins, Fish oils, and Herbal medications. Also stop all NSAIDS i.e. Advil, Ibuprofen, Motrin, Aleve, Anaprox, Naproxen, BC and Goody Powders. Including: Meloxicam (MOBIC)   WHAT DO I DO ABOUT MY DIABETES MEDICATION?  Marland Kitchen The day of surgery, do not take other diabetes injectable Victoza (liraglutide).  . If your CBG is greater than 220 mg/dL, call us at [phone]    How to Manage Your Diabetes Before and After Surgery  Why is it important to control my blood sugar before and after surgery? . Improving blood sugar levels before and after surgery helps healing and can limit problems. . A way of improving blood sugar control is eating a healthy diet by: o  Eating less sugar and carbohydrates o  Increasing activity/exercise o  Talking with your doctor about reaching your blood sugar goals . High blood sugars (greater than 180 mg/dL) can raise your risk of infections and slow your recovery, so you will need to focus on controlling your diabetes during the weeks before surgery. . Make sure that the  doctor who takes care of your diabetes knows about your planned surgery including the date and location.  How do I manage my blood sugar before surgery? . Check your blood sugar at least 4 times a day, starting 2 days before surgery, to make sure that the level is not too high or low. o Check your blood sugar the morning of your surgery when you wake up and every 2 hours until you get to the Short Stay unit. . If your blood sugar is less than 70 mg/dL, you will need to treat for low blood sugar: o Do not take insulin. o Treat a low blood sugar (less than 70 mg/dL) with  cup of clear juice (cranberry or apple), 4 glucose tablets, OR glucose gel. Recheck blood sugar in 15 minutes after treatment (to make sure it is greater than 70 mg/dL). If your blood sugar is not greater than 70 mg/dL on recheck, call [phone] o  for further instructions. . Report your blood sugar to the short stay nurse when you get to Short Stay.  . If you are admitted to the hospital after surgery: o Your blood sugar will be checked by the staff and you will probably be given insulin after surgery (instead of oral diabetes medicines) to make sure you have good blood sugar levels. o The goal for blood sugar control after surgery is 80-180 mg/dL.    Do not wear jewelry, make-up or nail polish.  Do not wear lotions, powders, or perfumes, or deodorant.  Do not shave 48 hours prior to surgery.  Do not bring valuables to the hospital.  [facility] is not responsible for any belongings or valuables.  Hearing aids, eyeglasses, contacts, dentures or bridgework may not be worn into surgery.  Leave your suitcase in the car.  After surgery it may be brought to your room.  For patients admitted to the hospital, discharge time will be determined by your treatment team.  Patients discharged the day of surgery will not be allowed to drive home.   Special instructions: [facility]- Preparing For Surgery  Before surgery, you  can play an important role. Because skin is not sterile, your skin needs to be as free of germs as possible. You can reduce the number of germs on your skin by washing with CHG (chlorahexidine gluconate) Soap before surgery.  CHG is an antiseptic cleaner which kills germs and bonds with the skin to continue killing germs even after washing.  Please do not use if you have an allergy to CHG or antibacterial soaps. If your skin becomes reddened/irritated stop using the CHG.  Do not shave (including legs and underarms) for at least 48 hours prior to first CHG shower. It is OK to shave your face.  Please follow these instructions carefully.   1. Shower the NIGHT BEFORE SURGERY and the MORNING OF SURGERY with CHG.   2. If you chose to wash your hair, wash your hair first as usual with your normal shampoo.  3. After you shampoo, rinse your hair and body thoroughly to remove the shampoo.  4. Use CHG as you would any other liquid soap. You can apply CHG directly to the skin and wash gently with a scrungie or a clean washcloth.   5. Apply the CHG Soap to your body ONLY FROM THE NECK DOWN.  Do not use on open wounds or open sores. Avoid contact with your eyes, ears, mouth and genitals (private parts). Wash Face and genitals (private parts)  with your normal soap.  6. Wash thoroughly, paying special attention to the area where your surgery will be performed.  7. Thoroughly rinse your body with warm water from the neck down.  8. DO NOT shower/wash with your normal soap after using and rinsing off the CHG Soap.  9. Pat yourself dry with a CLEAN TOWEL.  10. Wear CLEAN PAJAMAS to bed the night before surgery, wear comfortable clothes the morning of surgery  11. Place CLEAN SHEETS on your bed the night of your first shower and DO NOT SLEEP WITH PETS.  Day of Surgery: Shower as stated above. Do not apply any deodorants/lotions. Please wear clean clothes to the hospital/surgery center.     Please  read over the following fact sheets that you were given.

## 2017-08-26 NOTE — Progress Notes (Signed)
Anesthesia Chart Review: Patient is a 72 year old female scheduled for right reverse total shoulder arthroplasty on 08/29/17 by Dr. Tania Ade.  History includes never smoker, HTN, depression, asthma, OSA, back pain, PFO 12/28/14, (closure not recommended, neurologist Dr. Asencion Partridge Dohmeier), DM2, arthritis, gastric bypass '80, left TKA, colon resection '07, cholecystectomy, nasal septum surgery. BMI is consistent with super morbid obesity.  PCP is Tobie Lords, NP at [facility]). She was seen for preoperative evaluation on 08/02/17. BP medications adjusted. Postoperative IS recommended.   - Cardiologist is Dr. Idolina Primer with [facility]). She was seen on 08/13/17 for pre-operative evaluation with CV risk factors and PFO history. He wrote, "Agree with recommendations as set forward by NP Ouida Sills; have instructed her to hold her losartan on morning of surgery.  No further cardiac testing warranted at this time.  RCR-index score is 1 point (giving her a 6% chance of peri-operative MACE); no role or evidence for initiation of BB at this time. She is low-intermediate risk for low-intermediate risk surgery." PRN follow-up recommended.  Meds include albuterol, amlodipine, Celexa, Bentyl, losartan, Crestor (not started yet), Topamax, tramadol, Victoza.  BP (!) 156/72   Pulse 93   Temp 36.6 C   Resp 20   Ht 5\' 6"  (1.676 m)   Wt (!) 334 lb 4.8 oz (151.6 kg)   SpO2 98%   BMI 53.96 kg/m   EKG 08/13/17 ([place]): Result Impression: SR, non-specific IVCD. TRACING REQUESTED.    Transcranial Doppler (ordered by neurology as part of dizziness evaluation) 12/28/14: Summary: High Intensity Transient Signals (HITS) were heard during valsalva release phase only, indicating a small PFO. Such a small PFO is unlikely to be of clinical significance.   Preoperative labs noted. Cr 0.72. Glucose 103. CBC,  PT/PTT WNL. A1c 5.8 on 08/02/17 (Care Everywhere). UA showed large leukocytes, negative nitrites, WBC too numerous to count. Result called to [facility] at Dr. Bettina Gavia office, she will verify that Dr. Tamera Punt reviewed results. Defer additional recommendation, if any, to surgeon.  Based on currently available information, I anticipate that she can proceed as planned from an anesthesia standpoint if no acute changes.  [facility] Short Stay Center/Anesthesiology Phone [phone] 08/26/2017 2:35 PM

## 2017-08-28 MED ORDER — VANCOMYCIN HCL 10 G IV SOLR
1500.0000 mg | INTRAVENOUS | Status: AC
  Administered 2017-08-29: 1500 mg via INTRAVENOUS
  Filled 2017-08-28: qty 1500

## 2017-08-29 ENCOUNTER — Encounter (HOSPITAL_COMMUNITY)
Admission: RE | Disposition: A | Discharge status: Home / Self Care | Payer: Self-pay | Source: Ambulatory Visit | Attending: Orthopedic Surgery

## 2017-08-29 ENCOUNTER — Inpatient Hospital Stay (HOSPITAL_COMMUNITY): Payer: Medicare Other | Admitting: Certified Registered"

## 2017-08-29 ENCOUNTER — Inpatient Hospital Stay (HOSPITAL_COMMUNITY)
Admission: RE | Admit: 2017-08-29 | Discharge: 2017-08-30 | DRG: 483 | Disposition: A | Discharge status: Home / Self Care | Payer: Medicare Other | Source: Ambulatory Visit | Attending: Orthopedic Surgery | Admitting: Orthopedic Surgery

## 2017-08-29 ENCOUNTER — Other Ambulatory Visit: Payer: Self-pay

## 2017-08-29 ENCOUNTER — Encounter (HOSPITAL_COMMUNITY): Payer: Self-pay | Admitting: *Deleted

## 2017-08-29 ENCOUNTER — Inpatient Hospital Stay (HOSPITAL_COMMUNITY): Payer: Medicare Other | Admitting: Vascular Surgery

## 2017-08-29 ENCOUNTER — Inpatient Hospital Stay (HOSPITAL_COMMUNITY): Payer: Medicare Other

## 2017-08-29 DIAGNOSIS — G4733 Obstructive sleep apnea (adult) (pediatric): Secondary | ICD-10-CM | POA: Diagnosis present

## 2017-08-29 DIAGNOSIS — Z880 Allergy status to penicillin: Secondary | ICD-10-CM

## 2017-08-29 DIAGNOSIS — Z885 Allergy status to narcotic agent status: Secondary | ICD-10-CM

## 2017-08-29 DIAGNOSIS — M549 Dorsalgia, unspecified: Secondary | ICD-10-CM | POA: Diagnosis present

## 2017-08-29 DIAGNOSIS — Z96652 Presence of left artificial knee joint: Secondary | ICD-10-CM | POA: Diagnosis present

## 2017-08-29 DIAGNOSIS — Z96611 Presence of right artificial shoulder joint: Secondary | ICD-10-CM

## 2017-08-29 DIAGNOSIS — Z9882 Breast implant status: Secondary | ICD-10-CM | POA: Diagnosis not present

## 2017-08-29 DIAGNOSIS — Z79899 Other long term (current) drug therapy: Secondary | ICD-10-CM

## 2017-08-29 DIAGNOSIS — M109 Gout, unspecified: Secondary | ICD-10-CM | POA: Diagnosis present

## 2017-08-29 DIAGNOSIS — Z888 Allergy status to other drugs, medicaments and biological substances status: Secondary | ICD-10-CM

## 2017-08-29 DIAGNOSIS — Z6841 Body Mass Index (BMI) 40.0 and over, adult: Secondary | ICD-10-CM | POA: Diagnosis not present

## 2017-08-29 DIAGNOSIS — Z91048 Other nonmedicinal substance allergy status: Secondary | ICD-10-CM

## 2017-08-29 DIAGNOSIS — Z79891 Long term (current) use of opiate analgesic: Secondary | ICD-10-CM

## 2017-08-29 DIAGNOSIS — F329 Major depressive disorder, single episode, unspecified: Secondary | ICD-10-CM | POA: Diagnosis present

## 2017-08-29 DIAGNOSIS — J45909 Unspecified asthma, uncomplicated: Secondary | ICD-10-CM | POA: Diagnosis present

## 2017-08-29 DIAGNOSIS — Z881 Allergy status to other antibiotic agents status: Secondary | ICD-10-CM | POA: Diagnosis not present

## 2017-08-29 DIAGNOSIS — M75101 Unspecified rotator cuff tear or rupture of right shoulder, not specified as traumatic: Secondary | ICD-10-CM | POA: Diagnosis present

## 2017-08-29 DIAGNOSIS — I10 Essential (primary) hypertension: Secondary | ICD-10-CM | POA: Diagnosis present

## 2017-08-29 DIAGNOSIS — Z8619 Personal history of other infectious and parasitic diseases: Secondary | ICD-10-CM | POA: Diagnosis not present

## 2017-08-29 DIAGNOSIS — Z791 Long term (current) use of non-steroidal anti-inflammatories (NSAID): Secondary | ICD-10-CM | POA: Diagnosis not present

## 2017-08-29 DIAGNOSIS — Z23 Encounter for immunization: Secondary | ICD-10-CM

## 2017-08-29 DIAGNOSIS — M19011 Primary osteoarthritis, right shoulder: Secondary | ICD-10-CM | POA: Diagnosis present

## 2017-08-29 DIAGNOSIS — Z9884 Bariatric surgery status: Secondary | ICD-10-CM

## 2017-08-29 DIAGNOSIS — E119 Type 2 diabetes mellitus without complications: Secondary | ICD-10-CM | POA: Diagnosis present

## 2017-08-29 DIAGNOSIS — M25711 Osteophyte, right shoulder: Secondary | ICD-10-CM | POA: Diagnosis present

## 2017-08-29 DIAGNOSIS — K219 Gastro-esophageal reflux disease without esophagitis: Secondary | ICD-10-CM | POA: Diagnosis present

## 2017-08-29 HISTORY — PX: TOTAL SHOULDER ARTHROPLASTY: SHX126

## 2017-08-29 HISTORY — PX: REVERSE SHOULDER ARTHROPLASTY: SHX5054

## 2017-08-29 LAB — GLUCOSE, CAPILLARY
GLUCOSE-CAPILLARY: 158 mg/dL — AB (ref 65–99)
GLUCOSE-CAPILLARY: 242 mg/dL — AB (ref 65–99)
Glucose-Capillary: 100 mg/dL — ABNORMAL HIGH (ref 65–99)

## 2017-08-29 SURGERY — ARTHROPLASTY, SHOULDER, TOTAL
Anesthesia: General | Site: Shoulder | Laterality: Right

## 2017-08-29 MED ORDER — CEFAZOLIN SODIUM-DEXTROSE 1-4 GM/50ML-% IV SOLN: 1.0000 g | Freq: Four times a day (QID) | INTRAVENOUS | Status: DC

## 2017-08-29 MED ORDER — LACTATED RINGERS IV SOLN
INTRAVENOUS | Status: DC
  Administered 2017-08-29: 09:00:00 via INTRAVENOUS

## 2017-08-29 MED ORDER — ZOLPIDEM TARTRATE 5 MG PO TABS: 5.0000 mg | ORAL_TABLET | Freq: Every evening | ORAL | Status: DC | PRN

## 2017-08-29 MED ORDER — OXYCODONE HCL 5 MG PO TABS
5.0000 mg | ORAL_TABLET | ORAL | Status: DC | PRN
  Administered 2017-08-29: 5 mg via ORAL
  Filled 2017-08-29: qty 1

## 2017-08-29 MED ORDER — HYDROMORPHONE HCL 1 MG/ML IJ SOLN: 0.2500 mg | INTRAMUSCULAR | Status: DC | PRN

## 2017-08-29 MED ORDER — TRANEXAMIC ACID 1000 MG/10ML IV SOLN
1000.0000 mg | INTRAVENOUS | Status: AC
  Administered 2017-08-29: 1000 mg via INTRAVENOUS
  Filled 2017-08-29: qty 1100

## 2017-08-29 MED ORDER — DICYCLOMINE HCL 20 MG PO TABS: 20.0000 mg | ORAL_TABLET | Freq: Three times a day (TID) | ORAL | Status: DC | PRN

## 2017-08-29 MED ORDER — ACETAMINOPHEN 650 MG RE SUPP: 650.0000 mg | RECTAL | Status: DC | PRN

## 2017-08-29 MED ORDER — ALUM & MAG HYDROXIDE-SIMETH 200-200-20 MG/5ML PO SUSP: 30.0000 mL | ORAL | Status: DC | PRN

## 2017-08-29 MED ORDER — SUCCINYLCHOLINE CHLORIDE 200 MG/10ML IV SOSY
PREFILLED_SYRINGE | INTRAVENOUS | Status: DC | PRN
  Administered 2017-08-29: 140 mg via INTRAVENOUS

## 2017-08-29 MED ORDER — PROPOFOL 10 MG/ML IV BOLUS
INTRAVENOUS | Status: AC
  Filled 2017-08-29: qty 20

## 2017-08-29 MED ORDER — SODIUM CHLORIDE 0.9 % IR SOLN
Status: DC | PRN
  Administered 2017-08-29: 3000 mL

## 2017-08-29 MED ORDER — CITALOPRAM HYDROBROMIDE 20 MG PO TABS
20.0000 mg | ORAL_TABLET | Freq: Every day | ORAL | Status: DC
  Administered 2017-08-30: 20 mg via ORAL
  Filled 2017-08-29: qty 1

## 2017-08-29 MED ORDER — AMLODIPINE BESYLATE 10 MG PO TABS
10.0000 mg | ORAL_TABLET | Freq: Every day | ORAL | Status: DC
  Administered 2017-08-30: 10 mg via ORAL
  Filled 2017-08-29: qty 1

## 2017-08-29 MED ORDER — LIDOCAINE 2% (20 MG/ML) 5 ML SYRINGE
INTRAMUSCULAR | Status: AC
  Filled 2017-08-29: qty 5

## 2017-08-29 MED ORDER — PHENOL 1.4 % MT LIQD: 1.0000 | OROMUCOSAL | Status: DC | PRN

## 2017-08-29 MED ORDER — FENTANYL CITRATE (PF) 100 MCG/2ML IJ SOLN
INTRAMUSCULAR | Status: AC
  Administered 2017-08-29: 50 ug via INTRAVENOUS
  Filled 2017-08-29: qty 2

## 2017-08-29 MED ORDER — LOSARTAN POTASSIUM 50 MG PO TABS
100.0000 mg | ORAL_TABLET | Freq: Every day | ORAL | Status: DC
  Administered 2017-08-30: 100 mg via ORAL
  Filled 2017-08-29: qty 2

## 2017-08-29 MED ORDER — CLONIDINE HCL (ANALGESIA) 100 MCG/ML EP SOLN
EPIDURAL | Status: DC | PRN
  Administered 2017-08-29: 100 ug

## 2017-08-29 MED ORDER — ALBUTEROL SULFATE (2.5 MG/3ML) 0.083% IN NEBU
3.0000 mL | INHALATION_SOLUTION | Freq: Four times a day (QID) | RESPIRATORY_TRACT | Status: DC | PRN
  Administered 2017-08-29: 3 mL via RESPIRATORY_TRACT
  Filled 2017-08-29: qty 3

## 2017-08-29 MED ORDER — EPHEDRINE 5 MG/ML INJ
INTRAVENOUS | Status: AC
  Filled 2017-08-29: qty 10

## 2017-08-29 MED ORDER — METOCLOPRAMIDE HCL 5 MG PO TABS: 5.0000 mg | ORAL_TABLET | Freq: Three times a day (TID) | ORAL | Status: DC | PRN

## 2017-08-29 MED ORDER — DEXAMETHASONE SODIUM PHOSPHATE 10 MG/ML IJ SOLN
INTRAMUSCULAR | Status: AC
  Filled 2017-08-29: qty 1

## 2017-08-29 MED ORDER — PHENYLEPHRINE 40 MCG/ML (10ML) SYRINGE FOR IV PUSH (FOR BLOOD PRESSURE SUPPORT)
PREFILLED_SYRINGE | INTRAVENOUS | Status: DC | PRN
  Administered 2017-08-29: 80 ug via INTRAVENOUS

## 2017-08-29 MED ORDER — SODIUM CHLORIDE 0.9 % IV SOLN
INTRAVENOUS | Status: DC
  Administered 2017-08-29: 15:00:00 via INTRAVENOUS

## 2017-08-29 MED ORDER — SUGAMMADEX SODIUM 200 MG/2ML IV SOLN
INTRAVENOUS | Status: AC
  Filled 2017-08-29: qty 2

## 2017-08-29 MED ORDER — ROPIVACAINE HCL 5 MG/ML IJ SOLN
INTRAMUSCULAR | Status: DC | PRN
  Administered 2017-08-29: 30 mL via PERINEURAL

## 2017-08-29 MED ORDER — ASPIRIN EC 325 MG PO TBEC
325.0000 mg | DELAYED_RELEASE_TABLET | Freq: Every day | ORAL | Status: DC
  Administered 2017-08-29 – 2017-08-30 (×2): 325 mg via ORAL
  Filled 2017-08-29 (×2): qty 1

## 2017-08-29 MED ORDER — PHENYLEPHRINE HCL 10 MG/ML IJ SOLN
INTRAVENOUS | Status: DC | PRN
  Administered 2017-08-29: 40 ug/min via INTRAVENOUS

## 2017-08-29 MED ORDER — METOCLOPRAMIDE HCL 5 MG/ML IJ SOLN: 5.0000 mg | Freq: Three times a day (TID) | INTRAMUSCULAR | Status: DC | PRN

## 2017-08-29 MED ORDER — ONDANSETRON HCL 4 MG/2ML IJ SOLN
INTRAMUSCULAR | Status: DC | PRN
  Administered 2017-08-29: 4 mg via INTRAVENOUS

## 2017-08-29 MED ORDER — METOCLOPRAMIDE HCL 5 MG/ML IJ SOLN: 10.0000 mg | Freq: Once | INTRAMUSCULAR | Status: DC | PRN

## 2017-08-29 MED ORDER — EPHEDRINE SULFATE-NACL 50-0.9 MG/10ML-% IV SOSY
PREFILLED_SYRINGE | INTRAVENOUS | Status: DC | PRN
  Administered 2017-08-29: 10 mg via INTRAVENOUS

## 2017-08-29 MED ORDER — SUGAMMADEX SODIUM 200 MG/2ML IV SOLN
INTRAVENOUS | Status: DC | PRN
  Administered 2017-08-29: 300 mg via INTRAVENOUS

## 2017-08-29 MED ORDER — MENTHOL 3 MG MT LOZG: 1.0000 | LOZENGE | OROMUCOSAL | Status: DC | PRN

## 2017-08-29 MED ORDER — PHENYLEPHRINE 40 MCG/ML (10ML) SYRINGE FOR IV PUSH (FOR BLOOD PRESSURE SUPPORT)
PREFILLED_SYRINGE | INTRAVENOUS | Status: AC
  Filled 2017-08-29: qty 10

## 2017-08-29 MED ORDER — ACETAMINOPHEN 500 MG PO TABS
1000.0000 mg | ORAL_TABLET | Freq: Four times a day (QID) | ORAL | Status: AC
  Administered 2017-08-29 – 2017-08-30 (×3): 1000 mg via ORAL
  Filled 2017-08-29 (×3): qty 2

## 2017-08-29 MED ORDER — MIDAZOLAM HCL 2 MG/2ML IJ SOLN
1.0000 mg | Freq: Once | INTRAMUSCULAR | Status: AC
  Administered 2017-08-29: 1 mg via INTRAVENOUS

## 2017-08-29 MED ORDER — ROSUVASTATIN CALCIUM 10 MG PO TABS
20.0000 mg | ORAL_TABLET | Freq: Every day | ORAL | Status: DC
Start: 2017-08-30 — End: 2017-08-30
  Administered 2017-08-30: 20 mg via ORAL
  Filled 2017-08-29: qty 2

## 2017-08-29 MED ORDER — FENTANYL CITRATE (PF) 100 MCG/2ML IJ SOLN
50.0000 ug | Freq: Once | INTRAMUSCULAR | Status: AC
  Administered 2017-08-29: 50 ug via INTRAVENOUS

## 2017-08-29 MED ORDER — PROPOFOL 10 MG/ML IV BOLUS
INTRAVENOUS | Status: DC | PRN
  Administered 2017-08-29: 200 mg via INTRAVENOUS

## 2017-08-29 MED ORDER — MORPHINE SULFATE (PF) 2 MG/ML IV SOLN: 1.0000 mg | INTRAVENOUS | Status: DC | PRN

## 2017-08-29 MED ORDER — SUGAMMADEX SODIUM 200 MG/2ML IV SOLN
INTRAVENOUS | Status: AC
Start: 2017-08-29 — End: 2017-08-29
  Filled 2017-08-29: qty 2

## 2017-08-29 MED ORDER — OXYCODONE HCL 5 MG PO TABS
10.0000 mg | ORAL_TABLET | ORAL | Status: DC | PRN
  Administered 2017-08-29 – 2017-08-30 (×3): 10 mg via ORAL
  Filled 2017-08-29 (×3): qty 2

## 2017-08-29 MED ORDER — ONDANSETRON HCL 4 MG PO TABS: 4.0000 mg | ORAL_TABLET | Freq: Four times a day (QID) | ORAL | Status: DC | PRN

## 2017-08-29 MED ORDER — SUCCINYLCHOLINE CHLORIDE 200 MG/10ML IV SOSY
PREFILLED_SYRINGE | INTRAVENOUS | Status: AC
  Filled 2017-08-29: qty 10

## 2017-08-29 MED ORDER — VANCOMYCIN HCL 10 G IV SOLR
1500.0000 mg | Freq: Once | INTRAVENOUS | Status: AC
  Administered 2017-08-29: 1500 mg via INTRAVENOUS
  Filled 2017-08-29: qty 1500

## 2017-08-29 MED ORDER — INSULIN ASPART 100 UNIT/ML ~~LOC~~ SOLN
0.0000 [IU] | Freq: Three times a day (TID) | SUBCUTANEOUS | Status: DC
  Administered 2017-08-29: 4 [IU] via SUBCUTANEOUS
  Administered 2017-08-30: 3 [IU] via SUBCUTANEOUS

## 2017-08-29 MED ORDER — DEXAMETHASONE SODIUM PHOSPHATE 10 MG/ML IJ SOLN
INTRAMUSCULAR | Status: DC | PRN
  Administered 2017-08-29: 10 mg via INTRAVENOUS
  Administered 2017-08-29: 10 mg

## 2017-08-29 MED ORDER — TOPIRAMATE 25 MG PO TABS
50.0000 mg | ORAL_TABLET | Freq: Two times a day (BID) | ORAL | Status: DC
  Administered 2017-08-29 – 2017-08-30 (×2): 50 mg via ORAL
  Filled 2017-08-29 (×2): qty 2

## 2017-08-29 MED ORDER — ROCURONIUM BROMIDE 10 MG/ML (PF) SYRINGE
PREFILLED_SYRINGE | INTRAVENOUS | Status: AC
  Filled 2017-08-29: qty 5

## 2017-08-29 MED ORDER — BISACODYL 5 MG PO TBEC: 5.0000 mg | DELAYED_RELEASE_TABLET | Freq: Every day | ORAL | Status: DC | PRN

## 2017-08-29 MED ORDER — FLEET ENEMA 7-19 GM/118ML RE ENEM: 1.0000 | ENEMA | Freq: Once | RECTAL | Status: DC | PRN

## 2017-08-29 MED ORDER — ONDANSETRON HCL 4 MG/2ML IJ SOLN: 4.0000 mg | Freq: Four times a day (QID) | INTRAMUSCULAR | Status: DC | PRN

## 2017-08-29 MED ORDER — ACETAMINOPHEN 325 MG PO TABS: 650.0000 mg | ORAL_TABLET | ORAL | Status: DC | PRN

## 2017-08-29 MED ORDER — ONDANSETRON HCL 4 MG/2ML IJ SOLN
INTRAMUSCULAR | Status: AC
  Filled 2017-08-29: qty 2

## 2017-08-29 MED ORDER — DIPHENHYDRAMINE HCL 12.5 MG/5ML PO ELIX: 12.5000 mg | ORAL_SOLUTION | ORAL | Status: DC | PRN

## 2017-08-29 MED ORDER — INFLUENZA VAC SPLIT HIGH-DOSE 0.5 ML IM SUSY
0.5000 mL | PREFILLED_SYRINGE | INTRAMUSCULAR | Status: AC
  Administered 2017-08-30: 0.5 mL via INTRAMUSCULAR
  Filled 2017-08-29: qty 0.5

## 2017-08-29 MED ORDER — 0.9 % SODIUM CHLORIDE (POUR BTL) OPTIME
TOPICAL | Status: DC | PRN
  Administered 2017-08-29: 1000 mL

## 2017-08-29 MED ORDER — MIDAZOLAM HCL 2 MG/2ML IJ SOLN
INTRAMUSCULAR | Status: AC
  Administered 2017-08-29: 1 mg via INTRAVENOUS
  Filled 2017-08-29: qty 2

## 2017-08-29 MED ORDER — POLYETHYLENE GLYCOL 3350 17 G PO PACK: 17.0000 g | PACK | Freq: Every day | ORAL | Status: DC | PRN

## 2017-08-29 MED ORDER — DOCUSATE SODIUM 100 MG PO CAPS
100.0000 mg | ORAL_CAPSULE | Freq: Two times a day (BID) | ORAL | Status: DC
  Administered 2017-08-29 – 2017-08-30 (×2): 100 mg via ORAL
  Filled 2017-08-29 (×2): qty 1

## 2017-08-29 MED ORDER — ROCURONIUM BROMIDE 100 MG/10ML IV SOLN
INTRAVENOUS | Status: DC | PRN
  Administered 2017-08-29: 50 mg via INTRAVENOUS
  Administered 2017-08-29 (×2): 10 mg via INTRAVENOUS

## 2017-08-29 MED ORDER — LIRAGLUTIDE 18 MG/3ML ~~LOC~~ SOPN: 1.2000 mg | PEN_INJECTOR | Freq: Every day | SUBCUTANEOUS | Status: DC

## 2017-08-29 SURGICAL SUPPLY — 84 items
AID PSTN UNV HD RSTRNT DISP (MISCELLANEOUS) ×1
BASEPLATE P2 COATD GLND 6.5X30 (Shoulder) IMPLANT
BIT DRILL 5/64X5 DISP (BIT) ×3 IMPLANT
BLADE SAW SAG 73X25 THK (BLADE) ×2
BLADE SAW SGTL 73X25 THK (BLADE) ×1 IMPLANT
BLADE SURG 15 STRL LF DISP TIS (BLADE) ×1 IMPLANT
BLADE SURG 15 STRL SS (BLADE) ×3
BSPLAT GLND 30 STRL LF SHLDR (Shoulder) ×1 IMPLANT
CHLORAPREP W/TINT 26ML (MISCELLANEOUS) ×3 IMPLANT
CLOSURE WOUND 1/2 X4 (GAUZE/BANDAGES/DRESSINGS) ×2
COVER SURGICAL LIGHT HANDLE (MISCELLANEOUS) ×3 IMPLANT
DRAPE INCISE 23X17 IOBAN STRL (DRAPES) ×4
DRAPE INCISE 23X17 STRL (DRAPES) IMPLANT
DRAPE INCISE IOBAN 23X17 STRL (DRAPES) ×2 IMPLANT
DRAPE INCISE IOBAN 66X45 STRL (DRAPES) ×1 IMPLANT
DRAPE ORTHO SPLIT 77X108 STRL (DRAPES) ×6
DRAPE SURG 17X23 STRL (DRAPES) ×3 IMPLANT
DRAPE SURG ORHT 6 SPLT 77X108 (DRAPES) ×2 IMPLANT
DRAPE U-SHAPE 47X51 STRL (DRAPES) ×3 IMPLANT
DRSG AQUACEL AG ADV 3.5X10 (GAUZE/BANDAGES/DRESSINGS) ×2 IMPLANT
ELECT BLADE 4.0 EZ CLEAN MEGAD (MISCELLANEOUS)
ELECT REM PT RETURN 9FT ADLT (ELECTROSURGICAL) ×3
ELECTRODE BLDE 4.0 EZ CLN MEGD (MISCELLANEOUS) IMPLANT
ELECTRODE REM PT RTRN 9FT ADLT (ELECTROSURGICAL) ×1 IMPLANT
GLOVE BIO SURGEON STRL SZ7 (GLOVE) ×3 IMPLANT
GLOVE BIO SURGEON STRL SZ7.5 (GLOVE) ×3 IMPLANT
GLOVE BIOGEL PI IND STRL 6.5 (GLOVE) IMPLANT
GLOVE BIOGEL PI IND STRL 7.0 (GLOVE) ×1 IMPLANT
GLOVE BIOGEL PI IND STRL 8 (GLOVE) ×1 IMPLANT
GLOVE BIOGEL PI INDICATOR 6.5 (GLOVE) ×4
GLOVE BIOGEL PI INDICATOR 7.0 (GLOVE) ×4
GLOVE BIOGEL PI INDICATOR 8 (GLOVE) ×2
GLOVE SURG SS PI 6.5 STRL IVOR (GLOVE) ×4 IMPLANT
GOWN STRL REUS W/ TWL LRG LVL3 (GOWN DISPOSABLE) ×1 IMPLANT
GOWN STRL REUS W/ TWL XL LVL3 (GOWN DISPOSABLE) ×1 IMPLANT
GOWN STRL REUS W/TWL LRG LVL3 (GOWN DISPOSABLE) ×9
GOWN STRL REUS W/TWL XL LVL3 (GOWN DISPOSABLE) ×3
HANDPIECE INTERPULSE COAX TIP (DISPOSABLE) ×3
HEMOSTAT SURGICEL 2X14 (HEMOSTASIS) ×3 IMPLANT
HEMOSTAT SURGICEL 2X4 FIBR (HEMOSTASIS) ×2 IMPLANT
HOOD PEEL AWAY FLYTE STAYCOOL (MISCELLANEOUS) ×8 IMPLANT
INSERT SMALL SOCKET 32MM NEU (Insert) ×2 IMPLANT
KIT BASIN OR (CUSTOM PROCEDURE TRAY) ×3 IMPLANT
KIT ROOM TURNOVER OR (KITS) ×3 IMPLANT
MANIFOLD NEPTUNE II (INSTRUMENTS) ×3 IMPLANT
NDL MAYO TROCAR (NEEDLE) ×1 IMPLANT
NEEDLE MAYO TROCAR (NEEDLE) ×3 IMPLANT
NS IRRIG 1000ML POUR BTL (IV SOLUTION) ×3 IMPLANT
P2 COATDE GLNOID BSEPLT 6.5X30 (Shoulder) ×3 IMPLANT
PACK SHOULDER (CUSTOM PROCEDURE TRAY) ×3 IMPLANT
PAD ARMBOARD 7.5X6 YLW CONV (MISCELLANEOUS) ×6 IMPLANT
RESTRAINT HEAD UNIVERSAL NS (MISCELLANEOUS) ×3 IMPLANT
RETRIEVER SUT HEWSON (MISCELLANEOUS) ×1 IMPLANT
SCREW BONE LOCKING RSP 5.0X14 (Screw) ×3 IMPLANT
SCREW BONE LOCKING RSP 5.0X30 (Screw) ×3 IMPLANT
SCREW BONE RSP LOCK 5X14 (Screw) IMPLANT
SCREW BONE RSP LOCK 5X22 (Screw) IMPLANT
SCREW BONE RSP LOCK 5X26 (Screw) IMPLANT
SCREW BONE RSP LOCK 5X30 (Screw) IMPLANT
SCREW BONE RSP LOCKING 5.0X26 (Screw) ×3 IMPLANT
SCREW BONE RSP LOCKING 5.0X32 (Screw) ×3 IMPLANT
SCREW RETAIN W/HEAD 4MM OFFSET (Shoulder) ×2 IMPLANT
SET HNDPC FAN SPRY TIP SCT (DISPOSABLE) ×1 IMPLANT
SLING ARM FOAM STRAP LRG (SOFTGOODS) ×3 IMPLANT
SLING ARM FOAM STRAP MED (SOFTGOODS) IMPLANT
SLING ARM FOAM STRAP XLG (SOFTGOODS) ×2 IMPLANT
SMARTMIX MINI TOWER (MISCELLANEOUS)
SPONGE LAP 18X18 X RAY DECT (DISPOSABLE) ×3 IMPLANT
SPONGE LAP 4X18 X RAY DECT (DISPOSABLE) IMPLANT
STEM HUMERAL 8X108MM SMALL (Stem) ×2 IMPLANT
STRIP CLOSURE SKIN 1/2X4 (GAUZE/BANDAGES/DRESSINGS) ×3 IMPLANT
SUCTION FRAZIER HANDLE 10FR (MISCELLANEOUS) ×2
SUCTION TUBE FRAZIER 10FR DISP (MISCELLANEOUS) ×1 IMPLANT
SUPPORT WRAP ARM LG (MISCELLANEOUS) ×3 IMPLANT
SUT ETHIBOND NAB CT1 #1 30IN (SUTURE) ×9 IMPLANT
SUT FIBERWIRE #2 38 T-5 BLUE (SUTURE)
SUT MNCRL AB 4-0 PS2 18 (SUTURE) ×3 IMPLANT
SUT VIC AB 2-0 CT1 27 (SUTURE) ×3
SUT VIC AB 2-0 CT1 TAPERPNT 27 (SUTURE) ×1 IMPLANT
SUTURE FIBERWR #2 38 T-5 BLUE (SUTURE) IMPLANT
TAPE LABRALWHITE 1.5X36 (TAPE) ×3 IMPLANT
TAPE SUT LABRALTAP WHT/BLK (SUTURE) ×3 IMPLANT
TOWEL OR 17X26 10 PK STRL BLUE (TOWEL DISPOSABLE) ×3 IMPLANT
TOWER SMARTMIX MINI (MISCELLANEOUS) ×1 IMPLANT

## 2017-08-29 NOTE — Anesthesia Preprocedure Evaluation (Signed)
Anesthesia Evaluation  Patient identified by MRN, date of birth, ID band Patient awake    Reviewed: Allergy & Precautions, NPO status , Patient's Chart, lab work & pertinent test results  Airway Mallampati: II  TM Distance: >3 FB Neck ROM: Full    Dental  (+) Teeth Intact, Caps   Pulmonary asthma , sleep apnea and Continuous Positive Airway Pressure Ventilation , pneumonia, resolved,    Pulmonary exam normal breath sounds clear to auscultation       Cardiovascular hypertension, Pt. on medications Normal cardiovascular exam Rhythm:Regular Rate:Normal     Neuro/Psych  Headaches, PSYCHIATRIC DISORDERS Depression    GI/Hepatic GERD  ,(+) Hepatitis -  Endo/Other  diabetes, Well Controlled, Type 2Morbid obesity  Renal/GU negative Renal ROS  negative genitourinary   Musculoskeletal  (+) Arthritis , Osteoarthritis,  OA right shoulder Torn rotator cuff right shoulder   Abdominal (+) + obese,   Peds  Hematology negative hematology ROS (+)   Anesthesia Other Findings   Reproductive/Obstetrics                             Anesthesia Physical Anesthesia Plan  ASA: III  Anesthesia Plan: General   Post-op Pain Management:    Induction: Intravenous  PONV Risk Score and Plan: 4 or greater and Midazolam, Dexamethasone, Ondansetron and Treatment may vary due to age or medical condition  Airway Management Planned: Oral ETT  Additional Equipment:   Intra-op Plan:   Post-operative Plan: Extubation in OR  Informed Consent: I have reviewed the patients History and Physical, chart, labs and discussed the procedure including the risks, benefits and alternatives for the proposed anesthesia with the patient or authorized representative who has indicated his/her understanding and acceptance.   Dental advisory given  Plan Discussed with:   Anesthesia Plan Comments:         Anesthesia Quick  Evaluation
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
## 2017-08-29 NOTE — Anesthesia Procedure Notes (Signed)
Anesthesia Regional Block: Interscalene brachial plexus block   Pre-Anesthetic Checklist: ,, timeout performed, Correct Patient, Correct Site, Correct Laterality, Correct Procedure, Correct Position, site marked, Risks and benefits discussed,  Surgical consent,  Pre-op evaluation,  At surgeon's request and post-op pain management  Laterality: Right  Prep: chloraprep       Needles:  Injection technique: Single-shot  Needle Type: Echogenic Stimulator Needle     Needle Length: 9cm  Needle Gauge: 21   Needle insertion depth: 5 cm   Additional Needles:   Procedures:,,,, ultrasound used (permanent image in chart),,,,  Narrative:  Start time: 08/29/2017 9:31 AM End time: 08/29/2017 9:36 AM Injection made incrementally with aspirations every 5 mL.  Performed by: Personally  Anesthesiologist: Josephine Igo, MD  Additional Notes: Timeout performed. Patient sedated. Relevant anatomy ID'd using [place]. Incremental 2-64ml injection of LA with frequent aspiration. Patient tolerated procedure well.

## 2017-08-29 NOTE — Anesthesia Postprocedure Evaluation (Signed)
Anesthesia Post Note  Patient: Danielle Stephenson  Procedure(s) Performed: RIGHT REVERSE TOTAL SHOULDER ARTHROPLASTY (Right Shoulder)     Patient location during evaluation: PACU Anesthesia Type: General Level of consciousness: awake and alert Pain management: pain level controlled Vital Signs Assessment: post-procedure vital signs reviewed and stable Respiratory status: spontaneous breathing, nonlabored ventilation, respiratory function stable and patient connected to nasal cannula oxygen Cardiovascular status: blood pressure returned to baseline and stable Postop Assessment: no apparent nausea or vomiting Anesthetic complications: no    Last Vitals:  Vitals:   08/29/17 1223 08/29/17 1240  BP: (!) 104/55 103/84  Pulse: 78 88  Resp: 20 20  Temp: 36.7 C   SpO2: 97% 95%    Last Pain:  Vitals:   08/29/17 1223  TempSrc:   PainSc: 0-No pain                 Nahima Ales A.

## 2017-08-29 NOTE — Op Note (Signed)
Procedure(s): RIGHT REVERSE TOTAL SHOULDER ARTHROPLASTY Procedure Note  HETVI SHAWHAN female 72 y.o. 08/29/2017  Procedure(s) and Anesthesia Type:    * RIGHT REVERSE TOTAL SHOULDER ARTHROPLASTY - Choice   Indications:  72 y.o. female  With endstage right shoulder arthritis with irrepairable rotator cuff tear. Pain and dysfunction interfered with quality of life and nonoperative treatment with activity modification, NSAIDS and injections failed.     Surgeon: Isabella Stalling   Assistants: Jeanmarie Hubert PA-C [facility] was present and scrubbed throughout the procedure and was essential in positioning, retraction, exposure, and closure)  Anesthesia: General endotracheal anesthesia with preoperative interscalene block given by the attending anesthesiologist   Procedure Detail  RIGHT REVERSE TOTAL SHOULDER ARTHROPLASTY   Estimated Blood Loss:  200 mL         Drains: none  Blood Given: none          Specimens: none        Complications:  * No complications entered in OR log *         Disposition: PACU - hemodynamically stable.         Condition: stable      OPERATIVE FINDINGS:  A DJO Altivate pressfit reverse total shoulder arthroplasty was placed with a  size 8 stem, a 32-4 glenosphere, and a standard-mm poly insert. The base plate  fixation was excellent.  PROCEDURE: The patient was identified in the preoperative holding area  where I personally marked the operative site after verifying site, side,  and procedure with the patient. An interscalene block given by  the attending anesthesiologist in the holding area and the patient was taken back to the operating room where all extremities were  carefully padded in position after general anesthesia was induced. She  was placed in a beach-chair position and the operative upper extremity was  prepped and draped in a standard sterile fashion. An approximately 10-  cm incision was made from the tip of the coracoid process  to the center  point of the humerus at the level of the axilla. Dissection was carried  down through subcutaneous tissues to the level of the cephalic vein  which was taken laterally with the deltoid. The pectoralis major was  retracted medially. The subdeltoid space was developed and the lateral  edge of the conjoined tendon was identified. The undersurface of  conjoined tendon was palpated and the musculocutaneous nerve was not in  the field. Retractor was placed underneath the conjoined and second  retractor was placed lateral into the deltoid. The circumflex humeral  artery and vessels were identified and clamped and coagulated. The  biceps tendon was tenotomized.  The subscapularis was taken down as a peel.  The  joint was then gently externally rotated while the capsule was released  from the humeral neck around to just beyond the 6 o'clock position. At  this point, the joint was dislocated and the humeral head was presented  into the wound. The excessive osteophyte formation was removed with a  large rongeur.  The cutting guide was used to make the appropriate  head cut and the head was saved for potentially bone grafting.  The glenoid was exposed with the arm in an  abducted extended position. The anterior and posterior labrum were  completely excised and the capsule was released circumferentially to  allow for exposure of the glenoid for preparation. The 2.5 mm drill was  placed using the guide in 5-10 inferior angulation and the tap was then advanced in  the same hole. Small and large reamers were then used. The tap was then removed and the Metaglene was then screwed in with excellent purchase.  The peripheral guide was then used to drilled measured and filled peripheral locking screws. The size 32-4 glenosphere was then impacted on the [facility] taper and the central screw was placed. The humerus was then again exposed and the diaphyseal reamers were used followed by the metaphyseal  reamers. The final broach was left in place in the proximal trial was placed. The joint was reduced and with this implant it was felt that soft tissue tensioning was appropriate with excellent stability and excellent range of motion. Therefore, final humeral stem was placed press-fit with bone grafting.  And then the trial polyethylene inserts were tested again and the above implant was felt to be the most appropriate for final insertion. The joint was reduced taken through full range of motion and felt to be stable. Soft tissue tension was appropriate.  The joint was then copiously irrigated with pulse  lavage and the wound was then closed. The subscapularis was not repaired.  Skin was closed with 2-0 Vicryl in a deep dermal layer and 4-0  Monocryl for skin closure. Steri-Strips were applied. Sterile  dressings were then applied as well as a sling. The patient was allowed  to awaken from general anesthesia, transferred to stretcher, and taken  to recovery room in stable condition.   POSTOPERATIVE PLAN: The patient will be kept in the hospital postoperatively  for pain control and therapy.

## 2017-08-29 NOTE — Discharge Instructions (Signed)

## 2017-08-29 NOTE — Progress Notes (Signed)
Spoke to Danielle Stephenson in xray regasrding shoulder xray yhat was ordered on pt states xray tech on floor and will be sure xray geys done

## 2017-08-29 NOTE — Transfer of Care (Signed)
Immediate Anesthesia Transfer of Care Note  Patient: Danielle Stephenson  Procedure(s) Performed: RIGHT REVERSE TOTAL SHOULDER ARTHROPLASTY (Right Shoulder)  Patient Location: PACU  Anesthesia Type:General and Regional  Level of Consciousness: awake, alert  and oriented  Airway & Oxygen Therapy: Patient Spontanous Breathing and Patient connected to face mask oxygen  Post-op Assessment: Report given to RN and Post -op Vital signs reviewed and stable  Post vital signs: Reviewed and stable  Last Vitals:  Vitals:   08/29/17 0837 08/29/17 1223  BP: 138/74 (!) 104/55  Pulse: 87 78  Resp: 17 20  Temp: 37 C   SpO2: 97% 97%    Last Pain:  Vitals:   08/29/17 0854  TempSrc:   PainSc: 4       Patients Stated Pain Goal: 4 (70/96/43 [id number])  Complications: No apparent anesthesia complications

## 2017-08-29 NOTE — Anesthesia Procedure Notes (Signed)
Procedure Name: Intubation Date/Time: 08/29/2017 10:41 AM Performed by: Gwyndolyn Saxon, CRNA Pre-anesthesia Checklist: Patient identified, Emergency Drugs available, Suction available, Patient being monitored and Timeout performed Patient Re-evaluated:Patient Re-evaluated prior to induction Oxygen Delivery Method: Circle system utilized Preoxygenation: Pre-oxygenation with 100% oxygen Induction Type: IV induction Ventilation: Mask ventilation without difficulty Laryngoscope Size: Miller and 2 Grade View: Grade II Tube type: Oral Number of attempts: 1 Placement Confirmation: ETT inserted through vocal cords under direct vision,  positive ETCO2,  CO2 detector and breath sounds checked- equal and bilateral Secured at: 21 cm Tube secured with: Tape Dental Injury: Teeth and Oropharynx as per pre-operative assessment

## 2017-08-30 ENCOUNTER — Encounter (HOSPITAL_COMMUNITY): Payer: Self-pay | Admitting: Orthopedic Surgery

## 2017-08-30 LAB — CBC
HEMATOCRIT: 40.9 % (ref 36.0–46.0)
HEMOGLOBIN: 14.1 g/dL (ref 12.0–15.0)
MCH: 31.1 pg (ref 26.0–34.0)
MCHC: 34.5 g/dL (ref 30.0–36.0)
MCV: 90.1 fL (ref 78.0–100.0)
Platelets: 300 10*3/uL (ref 150–400)
RBC: 4.54 MIL/uL (ref 3.87–5.11)
RDW: 13.2 % (ref 11.5–15.5)
WBC: 13.5 10*3/uL — ABNORMAL HIGH (ref 4.0–10.5)

## 2017-08-30 LAB — BASIC METABOLIC PANEL
Anion gap: 14 (ref 5–15)
BUN: 15 mg/dL (ref 6–20)
CHLORIDE: 105 mmol/L (ref 101–111)
CO2: 18 mmol/L — AB (ref 22–32)
CREATININE: 0.71 mg/dL (ref 0.44–1.00)
Calcium: 9.3 mg/dL (ref 8.9–10.3)
GFR calc non Af Amer: >60 mL/min (ref >60)
Glucose, Bld: 128 mg/dL — ABNORMAL HIGH (ref 65–99)
POTASSIUM: 4.4 mmol/L (ref 3.5–5.1)
SODIUM: 137 mmol/L (ref 135–145)

## 2017-08-30 LAB — GLUCOSE, CAPILLARY: GLUCOSE-CAPILLARY: 148 mg/dL — AB (ref 65–99)

## 2017-08-30 MED ORDER — OXYCODONE-ACETAMINOPHEN 5-325 MG PO TABS: 1.0000 | ORAL_TABLET | ORAL | 0 refills | Status: DC | PRN

## 2017-08-30 MED ORDER — DOCUSATE SODIUM 100 MG PO CAPS: 100.0000 mg | ORAL_CAPSULE | Freq: Three times a day (TID) | ORAL | 0 refills | Status: DC | PRN

## 2017-08-30 MED ORDER — METHOCARBAMOL 500 MG PO TABS: 500.0000 mg | ORAL_TABLET | Freq: Three times a day (TID) | ORAL | 30 refills | Status: DC

## 2017-08-30 NOTE — Progress Notes (Signed)
   PATIENT ID: Danielle Stephenson   1 Day Post-Op Procedure(s) (LRB): RIGHT REVERSE TOTAL SHOULDER ARTHROPLASTY (Right)  Subjective: Reports doing well, block is still just wearing off, some feeling in hand, no pain. No other complaints.   Objective:  Vitals:   08/30/17 0026 08/30/17 0445  BP: 136/61 (!) 132/59  Pulse: 79 66  Resp: 18 18  Temp: 98.4 F (36.9 C) 97.8 F (36.6 C)  SpO2: 96% 97%      R UE dressing c/d/i Wiggles fingers, distally some sensation to light touch from block  Labs:  No results for input(s): HGB in the last 72 hours.No results for input(s): WBC, RBC, HCT, PLT in the last 72 hours.No results for input(s): NA, K, CL, CO2, BUN, CREATININE, GLUCOSE, CALCIUM in the last 72 hours.  Assessment and Plan: 1 day s/p R reverse TSA OT- hand wrist elbow Ween to po pain rx D/c home today when cleared by OT Scripts in chart Fu in 2 weeks  VTE proph: asa, scds

## 2017-08-30 NOTE — Evaluation (Signed)
Occupational Therapy Evaluation Patient Details Name: Danielle Stephenson MRN: [id number] DOB: [birth year] Today's Date: 08/30/2017    History of Present Illness S/P reverse total shoulder arthroplasty, right   Clinical Impression   Pt admitted with the above diagnoses and presents with below problem list. Pt will benefit from continued acute OT to address the below listed deficits and maximize independence with ADLs prior to d/c home with multiple family members assisting as needed. PTA pt was independent with ADLs. Pt is currently supervision for functional mobility, min A for UB/LB ADLs. Shoulder education and R e/w/h ROM completed with pt. Pt awaiting d/c home today. [place] for d/c from OT standpoint.       Follow Up Recommendations  Follow surgeon's recommendation for DC plan and follow-up therapies    Equipment Recommendations  None recommended by OT    Recommendations for Other Services       Precautions / Restrictions Precautions Precautions: Shoulder Type of Shoulder Precautions: conservative Shoulder Interventions: Shoulder sling/immobilizer;At all times;Off for dressing/bathing/exercises Precaution Booklet Issued: Yes (comment) Required Braces or Orthoses: Sling Restrictions Weight Bearing Restrictions: Yes RUE Weight Bearing: Non weight bearing      Mobility Bed Mobility               General bed mobility comments: EOB/OOB during session  Transfers Overall transfer level: Modified independent Equipment used: None                  Balance Overall balance assessment: No apparent balance deficits (not formally assessed)                                         ADL either performed or assessed with clinical judgement   ADL Overall ADL's : Needs assistance/impaired Eating/Feeding: Set up;Sitting   Grooming: Minimal assistance;Sitting   Upper Body Bathing: Minimal assistance;Sitting   Lower Body Bathing: Minimal assistance;Sit to/from  stand   Upper Body Dressing : Minimal assistance;Sitting   Lower Body Dressing: Minimal assistance;Sit to/from stand   Toilet Transfer: Ambulation;Supervision/safety   Toileting- Water quality scientist and Hygiene: Supervision/safety;Sit to/from stand   Tub/ Shower Transfer: Tub transfer;Minimal assistance;Ambulation Tub/Shower Transfer Details (indicate cue type and reason): family can assist at home Functional mobility during ADLs: Supervision/safety General ADL Comments: Pt completed UB/LB dressing, functional mobility in room. Shoulder education given.      Vision         Perception     Praxis      Pertinent Vitals/Pain Pain Assessment: Faces Faces Pain Scale: Hurts a little bit Pain Location: R shoulder Pain Descriptors / Indicators: Aching;Tightness Pain Intervention(s): Monitored during session;Repositioned     Hand Dominance Right   Extremity/Trunk Assessment Upper Extremity Assessment Upper Extremity Assessment: RUE deficits/detail RUE Deficits / Details: S/P reverse total shoulder arthroplasty, right; full elbow ROM RUE: Unable to fully assess due to immobilization   Lower Extremity Assessment Lower Extremity Assessment: Overall WFL for tasks assessed   Cervical / Trunk Assessment Cervical / Trunk Assessment: Normal   Communication Communication Communication: No difficulties   Cognition Arousal/Alertness: Awake/alert Behavior During Therapy: WFL for tasks assessed/performed Overall Cognitive Status: Within Functional Limits for tasks assessed                                     General Comments  Exercises Exercises: Other exercises Other Exercises Other Exercises: e/w/h in supine. full elbow ROM   Shoulder Instructions      Home Living Family/patient expects to be discharged to:: Private residence Living Arrangements: Children;Other (Comment)(multiple family members helping at d/c) Available Help at Discharge:  Family;Available 24 hours/day Type of Home: House Home Access: Stairs to enter [employer] of Steps: 2 Entrance Stairs-Rails: None Home Layout: Two level;Able to live on main level with bedroom/bathroom     Bathroom Shower/Tub: Tub/shower unit         Home Equipment: Shower seat          Prior Functioning/Environment Level of Independence: Independent                 OT Problem List: Impaired balance (sitting and/or standing);Decreased knowledge of precautions;Decreased knowledge of use of DME or AE;Pain;Impaired UE functional use      OT Treatment/Interventions:      OT Goals(Current goals can be found in the care plan section) Acute Rehab OT Goals Patient Stated Goal: home today  OT Frequency:     Barriers to D/C:            Co-evaluation              AM-PAC PT "6 Clicks" Daily Activity     Outcome Measure Help from another person eating meals?: None Help from another person taking care of personal grooming?: A Little Help from another person toileting, which includes using toliet, bedpan, or urinal?: A Little Help from another person bathing (including washing, rinsing, drying)?: A Little Help from another person to put on and taking off regular upper body clothing?: A Little Help from another person to put on and taking off regular lower body clothing?: A Little 6 Click Score: 19   End of Session Equipment Utilized During Treatment: Other (comment)(sling)  Activity Tolerance: Patient tolerated treatment well Patient left: in chair;with call bell/phone within reach  OT Visit Diagnosis: Pain;Unsteadiness on feet (R26.81) Pain - Right/Left: Right Pain - part of body: Shoulder                Time: 6767-2094 OT Time Calculation (min): 36 min Charges:  OT General Charges $OT Visit: 1 Visit OT Evaluation $OT Eval Low Complexity: 1 Low OT Treatments $Self Care/Home Management : 8-22 mins G-Codes:       Hortencia Pilar 08/30/2017, 10:52 AM

## 2017-08-30 NOTE — Progress Notes (Signed)
Pt given prescriptions and discharge instructions. Instructions gone over with her and answered all questions to satisfaction. All belongings gathered to be sent home with her. Daughter picking her up. Pt in no distress at time of discharge.
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
## 2017-09-01 LAB — GLUCOSE, CAPILLARY: Glucose-Capillary: 131 mg/dL — ABNORMAL HIGH (ref 65–99)

## 2017-10-30 ENCOUNTER — Ambulatory Visit: Payer: Medicare Other

## 2017-11-25 ENCOUNTER — Ambulatory Visit: Payer: Medicare Other

## 2018-04-24 ENCOUNTER — Ambulatory Visit: Payer: Medicare Other

## 2018-05-26 ENCOUNTER — Ambulatory Visit: Payer: Medicare Other

## 2018-07-11 ENCOUNTER — Ambulatory Visit: Payer: Medicare Other

## 2018-07-16 DIAGNOSIS — J189 Pneumonia, unspecified organism: Secondary | ICD-10-CM

## 2018-07-16 HISTORY — DX: Pneumonia, unspecified organism: J18.9

## 2018-08-18 ENCOUNTER — Ambulatory Visit: Payer: Medicare Other

## 2018-09-16 ENCOUNTER — Ambulatory Visit: Payer: Medicare Other

## 2018-10-10 ENCOUNTER — Other Ambulatory Visit: Payer: Self-pay | Admitting: Family Medicine

## 2018-10-10 DIAGNOSIS — Z139 Encounter for screening, unspecified: Secondary | ICD-10-CM

## 2018-10-15 ENCOUNTER — Ambulatory Visit: Payer: Medicare Other

## 2018-12-04 ENCOUNTER — Ambulatory Visit: Payer: Medicare Other

## 2019-01-15 ENCOUNTER — Ambulatory Visit: Payer: Medicare Other

## 2019-05-13 NOTE — Pre-Procedure Instructions (Signed)
[id number] - [place], [place] - [address] [place] [address] Phone: [phone] Fax: [phone]    Your procedure is scheduled on Mon., Nov. 2, 2020 from 7:30AM-8:45AM  Report to [facility] Entrance "A" at 5:30AM  Call this number if you have problems the morning of surgery:  [phone]   Remember:  Do not eat after midnight on Nov. 1st  You may drink clear liquids until 3 hours (4:30AM) prior to surgery time.  Clear liquids allowed are:  Water, Juice (non-citric and without pulp), Carbonated beverages, Clear Tea, Black Coffee only, Plain Jell-O only, Gatorade and Plain Popsicles only    Take these medicines the morning of surgery with A SIP OF WATER: AmLODipine (NORVASC)  FLUoxetine (PROZAC)  Montelukast (SINGULAIR)  Rosuvastatin (CRESTOR) Fluticasone-Salmeterol (ADVAIR)   If Needed: Ipratropium-albuterol (DUONEB) Albuterol (VENTOLIN HFA)-bring with you the day of surgery  As of today, stop taking all Aspirin (unless instructed by your doctor) and Other Aspirin containing products, Vitamins, Fish oils, and Herbal medications. Also stop all NSAIDS i.e. Advil, Ibuprofen, Motrin, Aleve, Anaprox, Naproxen, BC, Goody Powders, and all Supplements.   The day of surgery, do not take other diabetes injectable Trulicity (dulaglutide).  How to Manage Your Diabetes Before and After Surgery  Why is it important to control my blood sugar before and after surgery? . Improving blood sugar levels before and after surgery helps healing and can limit problems. . A way of improving blood sugar control is eating a healthy diet by: o  Eating less sugar and carbohydrates o  Increasing activity/exercise o  Talking with your doctor about reaching your blood sugar goals . High blood sugars (greater than 180 mg/dL) can raise your risk of infections and slow your recovery, so you will need to focus on controlling  your diabetes during the weeks before surgery. . Make sure that the doctor who takes care of your diabetes knows about your planned surgery including the date and location.  How do I manage my blood sugar before surgery? . Check your blood sugar at least 4 times a day, starting 2 days before surgery, to make sure that the level is not too high or low. o Check your blood sugar the morning of your surgery when you wake up and every 2 hours until you get to the Short Stay unit. . If your blood sugar is less than 70 mg/dL, you will need to treat for low blood sugar: o Do not take insulin. o Treat a low blood sugar (less than 70 mg/dL) with  cup of clear juice (cranberry or apple), 4 glucose tablets, OR glucose gel. Recheck blood sugar in 15 minutes after treatment (to make sure it is greater than 70 mg/dL). If your blood sugar is not greater than 70 mg/dL on recheck, call [phone] o  for further instructions. . If your CBG is greater than 220 mg/dL, inform the staff upon arrival to Short Stay.  . If you are admitted to the hospital after surgery: o Your blood sugar will be checked by the staff and you will probably be given insulin after surgery (instead of oral diabetes medicines) to make sure you have good blood sugar levels. o The goal for blood sugar control after surgery is 80-180 mg/dL.  Reviewed and Endorsed by [facility] Patient Education Committee, August 2015   No Smoking of any kind, Tobacco, or Alcohol products 24 hours prior to your  procedure. If you use a Cpap machine, you may bring all equipment the day of surgery.   Special instructions:  [place]- Preparing For Surgery  Before surgery, you can play an important role. Because skin is not sterile, your skin needs to be as free of germs as possible. You can reduce the number of germs on your skin by washing with CHG (chlorahexidine gluconate) Soap before surgery.  CHG is an antiseptic cleaner which kills germs and bonds  with the skin to continue killing germs even after washing.    Please do not use if you have an allergy to CHG or antibacterial soaps. If your skin becomes reddened/irritated stop using the CHG.  Do not shave (including legs and underarms) for at least 48 hours prior to first CHG shower. It is OK to shave your face.  Please follow these instructions carefully.   1. Shower the NIGHT BEFORE SURGERY and the MORNING OF SURGERY with CHG.   2. If you chose to wash your hair, wash your hair first as usual with your normal shampoo.  3. After you shampoo, rinse your hair and body thoroughly to remove the shampoo.  4. Use CHG as you would any other liquid soap. You can apply CHG directly to the skin and wash gently with a scrungie or a clean washcloth.   5. Apply the CHG Soap to your body ONLY FROM THE NECK DOWN.  Do not use on open wounds or open sores. Avoid contact with your eyes, ears, mouth and genitals (private parts). Wash Face and genitals (private parts)  with your normal soap.  6. Wash thoroughly, paying special attention to the area where your surgery will be performed.  7. Thoroughly rinse your body with warm water from the neck down.  8. DO NOT shower/wash with your normal soap after using and rinsing off the CHG Soap.  9. Pat yourself dry with a CLEAN TOWEL.  10. Wear CLEAN PAJAMAS to bed the night before surgery, wear comfortable clothes the morning of surgery  11. Place CLEAN SHEETS on your bed the night of your first shower and DO NOT SLEEP WITH PETS.   Day of Surgery:             Remember to brush your teeth WITH YOUR REGULAR TOOTHPASTE.  Do not wear jewelry, make-up or nail polish.  Do not wear lotions, powders, or perfumes, or deodorant.  Do not shave 48 hours prior to surgery.    Do not bring valuables to the hospital.  [facility] is not responsible for any belongings or valuables.  Contacts, dentures or bridgework may not be worn into surgery.   For patients  admitted to the hospital, discharge time will be determined by your treatment team.  Patients discharged the day of surgery will not be allowed to drive home, and someone age 43 and over needs to stay with them for 24 hours.  Please wear clean clothes to the hospital/surgery center.    Please read over the following fact sheets that you were given.

## 2019-05-14 ENCOUNTER — Encounter (HOSPITAL_COMMUNITY)
Admission: RE | Admit: 2019-05-14 | Discharge: 2019-05-14 | Disposition: A | Discharge status: Home / Self Care | Payer: Medicare Other | Source: Ambulatory Visit | Attending: Obstetrics and Gynecology | Admitting: Obstetrics and Gynecology

## 2019-05-14 ENCOUNTER — Other Ambulatory Visit: Payer: Self-pay

## 2019-05-14 ENCOUNTER — Encounter (HOSPITAL_COMMUNITY): Payer: Self-pay

## 2019-05-14 ENCOUNTER — Inpatient Hospital Stay (HOSPITAL_COMMUNITY): Admission: RE | Admit: 2019-05-14 | Payer: Medicare Other | Source: Ambulatory Visit

## 2019-05-14 DIAGNOSIS — Z01812 Encounter for preprocedural laboratory examination: Secondary | ICD-10-CM | POA: Insufficient documentation

## 2019-05-14 HISTORY — DX: Unspecified cirrhosis of liver: K74.60

## 2019-05-14 HISTORY — DX: Unspecified abdominal hernia without obstruction or gangrene: K46.9

## 2019-05-14 HISTORY — DX: Other complications of anesthesia, initial encounter: T88.59XA

## 2019-05-14 HISTORY — DX: Postmenopausal bleeding: N95.0

## 2019-05-14 LAB — COMPREHENSIVE METABOLIC PANEL
ALT: 23 U/L (ref 0–44)
AST: 23 U/L (ref 15–41)
Albumin: 3.8 g/dL (ref 3.5–5.0)
Alkaline Phosphatase: 59 U/L (ref 38–126)
Anion gap: 11 (ref 5–15)
BUN: 10 mg/dL (ref 8–23)
CO2: 22 mmol/L (ref 22–32)
Calcium: 9.6 mg/dL (ref 8.9–10.3)
Chloride: 107 mmol/L (ref 98–111)
Creatinine, Ser: 0.53 mg/dL (ref 0.44–1.00)
GFR calc Af Amer: >60 mL/min (ref >60)
GFR calc non Af Amer: >60 mL/min (ref >60)
Glucose, Bld: 101 mg/dL — ABNORMAL HIGH (ref 70–99)
Potassium: 3.9 mmol/L (ref 3.5–5.1)
Sodium: 140 mmol/L (ref 135–145)
Total Bilirubin: 0.7 mg/dL (ref 0.3–1.2)
Total Protein: 7.6 g/dL (ref 6.5–8.1)

## 2019-05-14 LAB — CBC
HCT: 43 % (ref 36.0–46.0)
Hemoglobin: 14.3 g/dL (ref 12.0–15.0)
MCH: 30.1 pg (ref 26.0–34.0)
MCHC: 33.3 g/dL (ref 30.0–36.0)
MCV: 90.5 fL (ref 80.0–100.0)
Platelets: 329 10*3/uL (ref 150–400)
RBC: 4.75 MIL/uL (ref 3.87–5.11)
RDW: 13 % (ref 11.5–15.5)
WBC: 8.6 10*3/uL (ref 4.0–10.5)
nRBC: 0 % (ref 0.0–0.2)

## 2019-05-14 LAB — GLUCOSE, CAPILLARY: Glucose-Capillary: 125 mg/dL — ABNORMAL HIGH (ref 70–99)

## 2019-05-14 LAB — PROTIME-INR
INR: 1 (ref 0.8–1.2)
Prothrombin Time: 13.5 seconds (ref 11.4–15.2)

## 2019-05-14 LAB — SURGICAL PCR SCREEN
MRSA, PCR: NEGATIVE
Staphylococcus aureus: POSITIVE — AB

## 2019-05-14 NOTE — Progress Notes (Signed)
Pt denies any acute cardiopulmonary issues. Pt stated that she is under the care of Dr. Idolina Primer, Cardiology and Tobie Lords, [place]. Pt denies having a stress test and cardiac cath. Pt stated that an echo was performed > 20 years ago. Pt made aware to stop taking Biotin. Pt denies recent labs. Pt stated that she has clearance from FNP " Dr. Carlis Abbott said that if the EKG was okay, I can be cleared; " nurse requested LOV note, EKG tracing and clearance note from PCP; awaiting response. Pt verbalized understanding of all pre-op instructions. PA, Anesthesiology, asked to review pt history.

## 2019-05-15 ENCOUNTER — Other Ambulatory Visit (HOSPITAL_COMMUNITY)
Admission: RE | Admit: 2019-05-15 | Discharge: 2019-05-15 | Disposition: A | Discharge status: Home / Self Care | Payer: Medicare Other | Source: Ambulatory Visit | Attending: Obstetrics and Gynecology | Admitting: Obstetrics and Gynecology

## 2019-05-15 DIAGNOSIS — Z20828 Contact with and (suspected) exposure to other viral communicable diseases: Secondary | ICD-10-CM | POA: Diagnosis not present

## 2019-05-15 DIAGNOSIS — Z01812 Encounter for preprocedural laboratory examination: Secondary | ICD-10-CM | POA: Diagnosis present
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
## 2019-05-15 NOTE — Anesthesia Preprocedure Evaluation (Addendum)
Anesthesia Evaluation  Patient identified by MRN, date of birth, ID band Patient awake    Reviewed: Allergy & Precautions, NPO status , Patient's Chart, lab work & pertinent test results  Airway Mallampati: II  TM Distance: >3 FB Neck ROM: Full    Dental no notable dental hx.    Pulmonary asthma , sleep apnea and Continuous Positive Airway Pressure Ventilation ,    Pulmonary exam normal breath sounds clear to auscultation       Cardiovascular hypertension, Pt. on medications Normal cardiovascular exam Rhythm:Regular Rate:Normal     Neuro/Psych  Headaches, PSYCHIATRIC DISORDERS Depression    GI/Hepatic negative GI ROS, (+) Cirrhosis       ,   Endo/Other  diabetesMorbid obesity ([place])  Renal/GU negative Renal ROS     Musculoskeletal  (+) Arthritis , Gout Back pain   Abdominal   Peds  Hematology HLD   Anesthesia Other Findings postmenopausal bleeding  Reproductive/Obstetrics                           Anesthesia Physical Anesthesia Plan  ASA: IV  Anesthesia Plan: General   Post-op Pain Management:    Induction: Intravenous and Rapid sequence  PONV Risk Score and Plan: 4 or greater and Midazolam, Dexamethasone, Ondansetron and Treatment may vary due to age or medical condition  Airway Management Planned: Oral ETT  Additional Equipment:   Intra-op Plan:   Post-operative Plan: Extubation in OR  Informed Consent: I have reviewed the patients History and Physical, chart, labs and discussed the procedure including the risks, benefits and alternatives for the proposed anesthesia with the patient or authorized representative who has indicated his/her understanding and acceptance.     Dental advisory given  Plan Discussed with: CRNA  Anesthesia Plan Comments: (Reviewed PAT note written 05/15/2019 by Myra Gianotti, PA-C. )      Anesthesia Quick Evaluation

## 2019-05-16 LAB — NOVEL CORONAVIRUS, NAA (HOSP ORDER, SEND-OUT TO REF LAB; TAT 18-24 HRS): SARS-CoV-2, NAA: NOT DETECTED
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
## 2019-05-18 ENCOUNTER — Encounter (HOSPITAL_COMMUNITY): Payer: Self-pay

## 2019-05-18 ENCOUNTER — Encounter (HOSPITAL_COMMUNITY)
Admission: RE | Disposition: A | Discharge status: Home / Self Care | Payer: Self-pay | Source: Home / Self Care | Attending: Obstetrics and Gynecology

## 2019-05-18 ENCOUNTER — Ambulatory Visit (HOSPITAL_COMMUNITY): Payer: Medicare Other | Admitting: Vascular Surgery

## 2019-05-18 ENCOUNTER — Ambulatory Visit (HOSPITAL_COMMUNITY): Payer: Medicare Other | Admitting: Anesthesiology

## 2019-05-18 ENCOUNTER — Ambulatory Visit (HOSPITAL_COMMUNITY)
Admission: RE | Admit: 2019-05-18 | Discharge: 2019-05-18 | Disposition: A | Discharge status: Home / Self Care | Payer: Medicare Other | Attending: Obstetrics and Gynecology | Admitting: Obstetrics and Gynecology

## 2019-05-18 ENCOUNTER — Other Ambulatory Visit: Payer: Self-pay

## 2019-05-18 DIAGNOSIS — Z880 Allergy status to penicillin: Secondary | ICD-10-CM | POA: Diagnosis not present

## 2019-05-18 DIAGNOSIS — G4733 Obstructive sleep apnea (adult) (pediatric): Secondary | ICD-10-CM | POA: Insufficient documentation

## 2019-05-18 DIAGNOSIS — I10 Essential (primary) hypertension: Secondary | ICD-10-CM | POA: Diagnosis not present

## 2019-05-18 DIAGNOSIS — C541 Malignant neoplasm of endometrium: Secondary | ICD-10-CM | POA: Insufficient documentation

## 2019-05-18 DIAGNOSIS — N840 Polyp of corpus uteri: Secondary | ICD-10-CM | POA: Insufficient documentation

## 2019-05-18 DIAGNOSIS — M1990 Unspecified osteoarthritis, unspecified site: Secondary | ICD-10-CM | POA: Diagnosis not present

## 2019-05-18 DIAGNOSIS — N950 Postmenopausal bleeding: Secondary | ICD-10-CM | POA: Diagnosis not present

## 2019-05-18 DIAGNOSIS — E119 Type 2 diabetes mellitus without complications: Secondary | ICD-10-CM | POA: Insufficient documentation

## 2019-05-18 DIAGNOSIS — Z96611 Presence of right artificial shoulder joint: Secondary | ICD-10-CM | POA: Diagnosis not present

## 2019-05-18 DIAGNOSIS — Z96652 Presence of left artificial knee joint: Secondary | ICD-10-CM | POA: Diagnosis not present

## 2019-05-18 DIAGNOSIS — Q211 Atrial septal defect: Secondary | ICD-10-CM | POA: Diagnosis not present

## 2019-05-18 DIAGNOSIS — J45909 Unspecified asthma, uncomplicated: Secondary | ICD-10-CM | POA: Diagnosis not present

## 2019-05-18 DIAGNOSIS — Z6841 Body Mass Index (BMI) 40.0 and over, adult: Secondary | ICD-10-CM | POA: Insufficient documentation

## 2019-05-18 DIAGNOSIS — Z881 Allergy status to other antibiotic agents status: Secondary | ICD-10-CM | POA: Insufficient documentation

## 2019-05-18 DIAGNOSIS — Z888 Allergy status to other drugs, medicaments and biological substances status: Secondary | ICD-10-CM | POA: Diagnosis not present

## 2019-05-18 DIAGNOSIS — Z885 Allergy status to narcotic agent status: Secondary | ICD-10-CM | POA: Diagnosis not present

## 2019-05-18 HISTORY — PX: DILATATION & CURETTAGE/HYSTEROSCOPY WITH MYOSURE: SHX6511

## 2019-05-18 LAB — GLUCOSE, CAPILLARY
Glucose-Capillary: 132 mg/dL — ABNORMAL HIGH (ref 70–99)
Glucose-Capillary: 134 mg/dL — ABNORMAL HIGH (ref 70–99)

## 2019-05-18 SURGERY — DILATATION & CURETTAGE/HYSTEROSCOPY WITH MYOSURE
Anesthesia: General | Site: Vagina

## 2019-05-18 MED ORDER — SUCCINYLCHOLINE CHLORIDE 20 MG/ML IJ SOLN
INTRAMUSCULAR | Status: DC | PRN
  Administered 2019-05-18: 140 mg via INTRAVENOUS

## 2019-05-18 MED ORDER — DEXAMETHASONE SODIUM PHOSPHATE 10 MG/ML IJ SOLN
INTRAMUSCULAR | Status: DC | PRN
  Administered 2019-05-18: 10 mg via INTRAVENOUS

## 2019-05-18 MED ORDER — MUPIROCIN 2 % EX OINT
TOPICAL_OINTMENT | CUTANEOUS | Status: AC
  Filled 2019-05-18: qty 22

## 2019-05-18 MED ORDER — PROPOFOL 10 MG/ML IV BOLUS
INTRAVENOUS | Status: DC | PRN
  Administered 2019-05-18: 150 mg via INTRAVENOUS

## 2019-05-18 MED ORDER — SUCCINYLCHOLINE CHLORIDE 200 MG/10ML IV SOSY
PREFILLED_SYRINGE | INTRAVENOUS | Status: AC
  Filled 2019-05-18: qty 10

## 2019-05-18 MED ORDER — LACTATED RINGERS IV SOLN
INTRAVENOUS | Status: DC
  Administered 2019-05-18: 07:00:00 via INTRAVENOUS

## 2019-05-18 MED ORDER — ONDANSETRON HCL 4 MG/2ML IJ SOLN
INTRAMUSCULAR | Status: AC
  Filled 2019-05-18: qty 2

## 2019-05-18 MED ORDER — DEXAMETHASONE SODIUM PHOSPHATE 10 MG/ML IJ SOLN
INTRAMUSCULAR | Status: AC
  Filled 2019-05-18: qty 1

## 2019-05-18 MED ORDER — LIDOCAINE HCL 1 % IJ SOLN
INTRAMUSCULAR | Status: DC | PRN
  Administered 2019-05-18: 17 mL

## 2019-05-18 MED ORDER — VANCOMYCIN HCL IN DEXTROSE 1-5 GM/200ML-% IV SOLN
INTRAVENOUS | Status: AC
  Filled 2019-05-18: qty 200

## 2019-05-18 MED ORDER — FAMOTIDINE 20 MG PO TABS
ORAL_TABLET | ORAL | Status: AC
  Administered 2019-05-18: 20 mg via ORAL
  Filled 2019-05-18: qty 1

## 2019-05-18 MED ORDER — FENTANYL CITRATE (PF) 250 MCG/5ML IJ SOLN
INTRAMUSCULAR | Status: AC
  Filled 2019-05-18: qty 5

## 2019-05-18 MED ORDER — MIDAZOLAM HCL 5 MG/5ML IJ SOLN
INTRAMUSCULAR | Status: DC | PRN
  Administered 2019-05-18: 2 mg via INTRAVENOUS

## 2019-05-18 MED ORDER — FENTANYL CITRATE (PF) 100 MCG/2ML IJ SOLN
25.0000 ug | INTRAMUSCULAR | Status: DC | PRN
  Administered 2019-05-18: 50 ug via INTRAVENOUS

## 2019-05-18 MED ORDER — LIDOCAINE HCL (PF) 1 % IJ SOLN
INTRAMUSCULAR | Status: AC
  Filled 2019-05-18: qty 30

## 2019-05-18 MED ORDER — ONDANSETRON HCL 4 MG/2ML IJ SOLN: 4.0000 mg | Freq: Once | INTRAMUSCULAR | Status: DC | PRN

## 2019-05-18 MED ORDER — ACETAMINOPHEN 500 MG PO TABS
ORAL_TABLET | ORAL | Status: AC
  Administered 2019-05-18: 1000 mg via ORAL
  Filled 2019-05-18: qty 2

## 2019-05-18 MED ORDER — ONDANSETRON HCL 4 MG/2ML IJ SOLN
INTRAMUSCULAR | Status: DC | PRN
  Administered 2019-05-18: 4 mg via INTRAVENOUS

## 2019-05-18 MED ORDER — FAMOTIDINE 20 MG PO TABS
20.0000 mg | ORAL_TABLET | Freq: Once | ORAL | Status: AC
  Administered 2019-05-18: 07:00:00 20 mg via ORAL

## 2019-05-18 MED ORDER — FENTANYL CITRATE (PF) 100 MCG/2ML IJ SOLN
INTRAMUSCULAR | Status: AC
  Filled 2019-05-18: qty 2

## 2019-05-18 MED ORDER — SODIUM CHLORIDE 0.9 % IR SOLN
Status: DC | PRN
  Administered 2019-05-18: 3000 mL

## 2019-05-18 MED ORDER — PROPOFOL 10 MG/ML IV BOLUS
INTRAVENOUS | Status: AC
  Filled 2019-05-18: qty 20

## 2019-05-18 MED ORDER — PHENYLEPHRINE 40 MCG/ML (10ML) SYRINGE FOR IV PUSH (FOR BLOOD PRESSURE SUPPORT)
PREFILLED_SYRINGE | INTRAVENOUS | Status: DC | PRN
  Administered 2019-05-18: 80 ug via INTRAVENOUS

## 2019-05-18 MED ORDER — LIDOCAINE 2% (20 MG/ML) 5 ML SYRINGE
INTRAMUSCULAR | Status: AC
  Filled 2019-05-18: qty 5

## 2019-05-18 MED ORDER — VANCOMYCIN HCL 1000 MG IV SOLR
INTRAVENOUS | Status: DC | PRN
  Administered 2019-05-18: 1000 mg via INTRAVENOUS

## 2019-05-18 MED ORDER — PHENYLEPHRINE 40 MCG/ML (10ML) SYRINGE FOR IV PUSH (FOR BLOOD PRESSURE SUPPORT)
PREFILLED_SYRINGE | INTRAVENOUS | Status: AC
  Filled 2019-05-18: qty 10

## 2019-05-18 MED ORDER — EPHEDRINE SULFATE-NACL 50-0.9 MG/10ML-% IV SOSY
PREFILLED_SYRINGE | INTRAVENOUS | Status: DC | PRN
  Administered 2019-05-18 (×3): 10 mg via INTRAVENOUS

## 2019-05-18 MED ORDER — LIDOCAINE 2% (20 MG/ML) 5 ML SYRINGE
INTRAMUSCULAR | Status: DC | PRN
  Administered 2019-05-18: 60 mg via INTRAVENOUS

## 2019-05-18 MED ORDER — ACETAMINOPHEN 500 MG PO TABS
1000.0000 mg | ORAL_TABLET | Freq: Once | ORAL | Status: AC
  Administered 2019-05-18: 07:00:00 1000 mg via ORAL

## 2019-05-18 MED ORDER — EPHEDRINE 5 MG/ML INJ
INTRAVENOUS | Status: AC
  Filled 2019-05-18: qty 10

## 2019-05-18 MED ORDER — MIDAZOLAM HCL 2 MG/2ML IJ SOLN
INTRAMUSCULAR | Status: AC
  Filled 2019-05-18: qty 2

## 2019-05-18 MED ORDER — IBUPROFEN 800 MG PO TABS: 800.0000 mg | ORAL_TABLET | Freq: Three times a day (TID) | ORAL | 0 refills | Status: DC | PRN

## 2019-05-18 MED ORDER — FENTANYL CITRATE (PF) 100 MCG/2ML IJ SOLN
INTRAMUSCULAR | Status: DC | PRN
  Administered 2019-05-18 (×2): 50 ug via INTRAVENOUS

## 2019-05-18 SURGICAL SUPPLY — 18 items
CANISTER SUCT 3000ML PPV (MISCELLANEOUS) ×3 IMPLANT
CATH ROBINSON RED A/P 16FR (CATHETERS) ×3 IMPLANT
DEVICE MYOSURE LITE (MISCELLANEOUS) IMPLANT
DEVICE MYOSURE REACH (MISCELLANEOUS) IMPLANT
DILATOR CANAL MILEX (MISCELLANEOUS) ×2 IMPLANT
GLOVE BIO SURGEON STRL SZ 6.5 (GLOVE) ×2 IMPLANT
GLOVE BIO SURGEONS STRL SZ 6.5 (GLOVE) ×1
GLOVE BIOGEL PI IND STRL 7.0 (GLOVE) ×1 IMPLANT
GLOVE BIOGEL PI INDICATOR 7.0 (GLOVE) ×2
GOWN STRL REUS W/ TWL LRG LVL3 (GOWN DISPOSABLE) ×2 IMPLANT
GOWN STRL REUS W/TWL LRG LVL3 (GOWN DISPOSABLE) ×6
KIT PROCEDURE FLUENT (KITS) ×3 IMPLANT
KIT TURNOVER KIT B (KITS) ×3 IMPLANT
PACK VAGINAL MINOR WOMEN LF (CUSTOM PROCEDURE TRAY) ×3 IMPLANT
PAD OB MATERNITY 4.3X12.25 (PERSONAL CARE ITEMS) ×3 IMPLANT
SEAL ROD LENS SCOPE MYOSURE (ABLATOR) ×3 IMPLANT
TOWEL GREEN STERILE FF (TOWEL DISPOSABLE) ×6 IMPLANT
UNDERPAD 30X30 (UNDERPADS AND DIAPERS) ×3 IMPLANT

## 2019-05-18 NOTE — Interval H&P Note (Signed)
History and Physical Interval Note:  05/18/2019 6:59 AM  Danielle Stephenson  has presented today for surgery, with the diagnosis of postmenopausal bleeding.  The various methods of treatment have been discussed with the patient and family. After consideration of risks, benefits and other options for treatment, the patient has consented to  Procedure(s) with comments: [place], poss polypectomy (N/A) - extra time 15mins as a surgical intervention.  The patient's history has been reviewed, patient examined, no change in status, stable for surgery.  I have reviewed the patient's chart and labs.  Questions were answered to the patient's satisfaction.     Saundra Gin Bovard-Stuckert

## 2019-05-18 NOTE — Op Note (Signed)
NAMECASIA, PLA MEDICAL RECORD [id number] ACCOUNT [id number] DATE OF BIRTH:10-28-1945 FACILITY: [facility] LOCATION: [facility] PHYSICIAN:Johnrobert Foti BOVARD-STUCKERT, MD  OPERATIVE REPORT  DATE OF PROCEDURE:  05/18/2019  PREOPERATIVE DIAGNOSES:  Postmenopausal bleeding.  POSTOPERATIVE DIAGNOSES: 1.  Postmenopausal bleeding. 2.  Multiple uterine polyps.  PROCEDURES:   1.  Hysteroscopy. 2.  Dilatation and curettage. 3.  MyoSure polypectomy.  SURGEON:  Janyth Contes, MD  ASSISTANT:  None.  ANESTHESIA:  Local and general.  ESTIMATED BLOOD LOSS:  20 mL, minimal.  IV FLUIDS:  Per anesthesia.  URINE OUTPUT:  200 mL, clear at the end of the procedure.    FLUID DEFICIT:  55.  COMPLICATIONS:  None.  PATHOLOGY:  Endometrial curettings, polypectomies to pathology.  DESCRIPTION OF PROCEDURE:  After informed consent was reviewed with the patient including risks, benefits and alternatives of the surgical procedure, she was transported to the operating room and placed on the table in the supine position.  General  anesthesia was induced and found to be adequate.  She was then placed in the [place] stirrups, prepped and draped in the normal sterile fashion.  Her bladder was sterilely drained.  An appropriate timeout was performed.  Using an open-sided speculum,  her cervix was easily visualized and a paracervical block was placed with 16 mL of 1% lidocaine.  The anterior lip of her cervix was grasped with a single-tooth tenaculum.  The cervix was dilated to accommodate a MyoSure scope to approximately 19-French.   The scope was introduced into the uterine cavity.  Multiple polyps were noted, both in the cavity itself as well as in the cervical canal.  Using the MyoSure device, these were removed.  Bilateral ostia were visualized.  The curettings were sent to  pathology.  A manual D and C was also performed until it was gritty in all 4 quadrants.  Again, a second visualization revealed  the above findings.  The instruments were removed, noted to be hemostatic.  She was returned to the supine position, awakened  in stable condition, transported to the PACU.  VN/NUANCE  D:05/18/2019 T:05/18/2019 JOB:[id number]

## 2019-05-18 NOTE — Anesthesia Procedure Notes (Signed)
Procedure Name: Intubation Date/Time: 05/18/2019 7:39 AM Performed by: Lance Coon, CRNA Pre-anesthesia Checklist: Patient identified, Emergency Drugs available, Suction available, Patient being monitored and Timeout performed Patient Re-evaluated:Patient Re-evaluated prior to induction Oxygen Delivery Method: Circle system utilized Preoxygenation: Pre-oxygenation with 100% oxygen Induction Type: IV induction and Rapid sequence Laryngoscope Size: Miller and 3 Grade View: Grade I Tube type: Oral Tube size: 7.0 mm Number of attempts: 1 Airway Equipment and Method: Stylet Placement Confirmation: ETT inserted through vocal cords under direct vision,  positive ETCO2 and breath sounds checked- equal and bilateral Secured at: 22 cm Tube secured with: Tape Dental Injury: Teeth and Oropharynx as per pre-operative assessment

## 2019-05-18 NOTE — Anesthesia Postprocedure Evaluation (Signed)
Anesthesia Post Note  Patient: Danielle Stephenson  Procedure(s) Performed: DILATATION & CURETTAGE/HYSTEROSCOPY WITH MYOSURE POLYPECTOMY (N/A Vagina )     Patient location during evaluation: PACU Anesthesia Type: General Level of consciousness: awake and alert Pain management: pain level controlled Vital Signs Assessment: post-procedure vital signs reviewed and stable Respiratory status: spontaneous breathing, nonlabored ventilation, respiratory function stable and patient connected to nasal cannula oxygen Cardiovascular status: blood pressure returned to baseline and stable Postop Assessment: no apparent nausea or vomiting Anesthetic complications: no    Last Vitals:  Vitals:   05/18/19 0935 05/18/19 0950  BP: 107/61 122/70  Pulse: 80 92  Resp: 19 19  Temp:  36.4 C  SpO2: 92% 93%    Last Pain:  Vitals:   05/18/19 0935  TempSrc:   PainSc: 3                  Ryan P Ellender

## 2019-05-18 NOTE — Brief Op Note (Signed)
05/18/2019  8:30 AM  PATIENT:  Danielle Stephenson  73 y.o. female  PRE-OPERATIVE DIAGNOSIS:  postmenopausal bleeding  POST-OPERATIVE DIAGNOSIS:  postmenopausal bleeding  PROCEDURE:  Procedure(s) with comments: DILATATION & CURETTAGE/HYSTEROSCOPY WITH MYOSURE POLYPECTOMY (N/A) - extra time 17mins  SURGEON:  Surgeon(s) and Role:    * Bovard-Stuckert, Honor Frison, MD - Primary  ANESTHESIA:   local and general  EBL:  20cc/min IVF per anesthesia, UOP 200cc, fluid defciet = 55  BLOOD ADMINISTERED:none  DRAINS: none   LOCAL MEDICATIONS USED:  LIDOCAINE   SPECIMEN:  Source of Specimen:  enndometrial currettings, polypectomies  DISPOSITION OF SPECIMEN:  PATHOLOGY  COUNTS:  YES  TOURNIQUET:  * No tourniquets in log *  DICTATION: .Other Dictation: Dictation Number [id number]  PLAN OF CARE: Discharge to home after PACU  PATIENT DISPOSITION:  PACU - hemodynamically stable.   Delay start of Pharmacological VTE agent (>24hrs) due to surgical blood loss or risk of bleeding: not applicable

## 2019-05-18 NOTE — Transfer of Care (Signed)
Immediate Anesthesia Transfer of Care Note  Patient: Danielle Stephenson  Procedure(s) Performed: DILATATION & CURETTAGE/HYSTEROSCOPY WITH MYOSURE POLYPECTOMY (N/A Vagina )  Patient Location: PACU  Anesthesia Type:General  Level of Consciousness: drowsy and patient cooperative  Airway & Oxygen Therapy: Patient Spontanous Breathing and Patient connected to face mask oxygen  Post-op Assessment: Report given to RN and Post -op Vital signs reviewed and stable  Post vital signs: Reviewed and stable  Last Vitals:  Vitals Value Taken Time  BP 152/55 05/18/19 0835  Temp 36.1 C 05/18/19 0835  Pulse 97 05/18/19 0835  Resp 22 05/18/19 0835  SpO2 98 % 05/18/19 0835  Vitals shown include unvalidated device data.  Last Pain:  Vitals:   05/18/19 0715  TempSrc: Oral  PainSc:          Complications: No apparent anesthesia complications

## 2019-05-19 ENCOUNTER — Encounter (HOSPITAL_COMMUNITY): Payer: Self-pay | Admitting: Obstetrics and Gynecology

## 2019-05-19 LAB — SURGICAL PATHOLOGY

## 2019-05-20 ENCOUNTER — Telehealth: Payer: Self-pay | Admitting: *Deleted

## 2019-05-20 NOTE — Telephone Encounter (Addendum)
Patient returned call and was given the appt date/time. Patient given address and phone number. Patient also given the policy for no visitors, mask and parking. Also explained that the doctor will perform a pelvic exam

## 2019-06-03 ENCOUNTER — Encounter: Payer: Self-pay | Admitting: Gynecologic Oncology

## 2019-06-03 ENCOUNTER — Ambulatory Visit
Admission: RE | Admit: 2019-06-03 | Discharge: 2019-06-03 | Disposition: A | Discharge status: Home / Self Care | Payer: Self-pay | Source: Ambulatory Visit | Attending: Gynecologic Oncology | Admitting: Gynecologic Oncology

## 2019-06-03 ENCOUNTER — Other Ambulatory Visit: Payer: Self-pay

## 2019-06-03 ENCOUNTER — Other Ambulatory Visit: Payer: Self-pay | Admitting: Oncology

## 2019-06-03 ENCOUNTER — Encounter: Payer: Self-pay | Admitting: Oncology

## 2019-06-03 ENCOUNTER — Inpatient Hospital Stay: Payer: Medicare Other | Attending: Gynecologic Oncology | Admitting: Gynecologic Oncology

## 2019-06-03 VITALS — BP 160/77 | HR 99 | Temp 98.9°F | Resp 19 | Ht 67.0 in | Wt 357.4 lb

## 2019-06-03 DIAGNOSIS — J45909 Unspecified asthma, uncomplicated: Secondary | ICD-10-CM | POA: Diagnosis not present

## 2019-06-03 DIAGNOSIS — M1990 Unspecified osteoarthritis, unspecified site: Secondary | ICD-10-CM | POA: Diagnosis not present

## 2019-06-03 DIAGNOSIS — Z79899 Other long term (current) drug therapy: Secondary | ICD-10-CM | POA: Diagnosis not present

## 2019-06-03 DIAGNOSIS — J4540 Moderate persistent asthma, uncomplicated: Secondary | ICD-10-CM

## 2019-06-03 DIAGNOSIS — C541 Malignant neoplasm of endometrium: Secondary | ICD-10-CM | POA: Insufficient documentation

## 2019-06-03 DIAGNOSIS — K7460 Unspecified cirrhosis of liver: Secondary | ICD-10-CM | POA: Insufficient documentation

## 2019-06-03 DIAGNOSIS — I10 Essential (primary) hypertension: Secondary | ICD-10-CM | POA: Diagnosis not present

## 2019-06-03 DIAGNOSIS — E119 Type 2 diabetes mellitus without complications: Secondary | ICD-10-CM | POA: Insufficient documentation

## 2019-06-03 DIAGNOSIS — K439 Ventral hernia without obstruction or gangrene: Secondary | ICD-10-CM | POA: Diagnosis not present

## 2019-06-03 DIAGNOSIS — G4733 Obstructive sleep apnea (adult) (pediatric): Secondary | ICD-10-CM | POA: Insufficient documentation

## 2019-06-03 DIAGNOSIS — Z6841 Body Mass Index (BMI) 40.0 and over, adult: Secondary | ICD-10-CM | POA: Insufficient documentation

## 2019-06-03 DIAGNOSIS — F329 Major depressive disorder, single episode, unspecified: Secondary | ICD-10-CM | POA: Diagnosis not present
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
## 2019-06-03 NOTE — Patient Instructions (Signed)
Preparing for your Surgery  Plan for surgery with Dr. Everitt Amber and Dr. Rosendo Gros [facility]) at [place] will be scheduled for a robotic assisted total laparoscopic hysterectomy, bilateral salpingo-oophorectomy, lysis of adhesions, ventral hernia repair, possible laparotomy, possible Mirena IUD placement.  We will arrange for a new patient consultation with Dr. Lyman Bishop at [facility] for hernia evaluation and to arrange a surgical plan.  Once you have met with him, we will work with his office to coordinate a surgical date.    We will also send the Mirena IUD to our outpatient pharmacy and check with your insurance.  We will inform you of the outcome of this and if a copay is needed.   Once a date has been established: Pre-operative Testing -You will receive a phone call from presurgical testing at [facility] if you have not received a call already to arrange for a pre-operative testing appointment before your surgery.  This appointment normally occurs one to two weeks before your scheduled surgery.   -Bring your insurance card, copy of an advanced directive if applicable, medication list  -At that visit, you will be asked to sign a consent for a possible blood transfusion in case a transfusion becomes necessary during surgery.  The need for a blood transfusion is rare but having consent is a necessary part of your care.     -You should not be taking blood thinners or aspirin at least ten days prior to surgery unless instructed by your surgeon.  -Do not take supplements such as fish oil (omega 3), red yeast rice, tumeric before your surgery.  Day Before Surgery at [place] will be asked to take in a light diet the day before surgery.  Avoid carbonated beverages.  You will be advised to have nothing to eat or drink after midnight the evening before.    Eat a light diet the day before surgery.  Examples including soups, broths, toast,  yogurt, mashed potatoes.  Things to avoid include carbonated beverages (fizzy beverages), raw fruits and raw vegetables, or beans.   If your bowels are filled with gas, your surgeon will have difficulty visualizing your pelvic organs which increases your surgical risks.  Your role in recovery Your role is to become active as soon as directed by your doctor, while still giving yourself time to heal.  Rest when you feel tired. You will be asked to do the following in order to speed your recovery:  - Cough and breathe deeply. This helps toclear and expand your lungs and can prevent pneumonia.  - Do mild physical activity. Walking or moving your legs help your circulation and body functions return to normal. A staff member will help you when you try to walk and will provide you with simple exercises. Do not try to get up or walk alone the first time. - Actively manage your pain. Managing your pain lets you move in comfort. We will ask you to rate your pain on a scale of zero to 10. It is your responsibility to tell your doctor or nurse where and how much you hurt so your pain can be treated.  Special Considerations -If you are diabetic, you may be placed on insulin after surgery to have closer control over your blood sugars to promote healing and recovery.  This does not mean that you will be discharged on insulin.  If applicable, your oral antidiabetics will be resumed when you are tolerating a solid  diet.  -Your final pathology results from surgery should be available around one week after surgery and the results will be relayed to you when available.  -Dr. Lahoma Crocker is the surgeon that assists your GYN Oncologist with surgery.  If you end up staying the night, the next day after your surgery you will either see Dr. Denman George or Dr. Lahoma Crocker.  -FMLA forms can be faxed to [phone] and please allow 5-7 business days for completion.  Pain Management After Surgery -You will be  prescribed your pain medication and bowel regimen medications before surgery so that you can have these available when you are discharged from the hospital. The pain medication is for use ONLY AFTER surgery and a new prescription will not be given.   -Make sure that you have Tylenol and Ibuprofen at home to use on a regular basis after surgery for pain control. We recommend alternating the medications every hour to six hours since they work differently and are processed in the body differently for pain relief.  -Review the attached handout on narcotic use and their risks and side effects.   Bowel Regimen -You will be prescribed Sennakot-S to take nightly to prevent constipation especially if you are taking the narcotic pain medication intermittently.  It is important to prevent constipation and drink adequate amounts of liquids.  Blood Transfusion Information WHAT IS A BLOOD TRANSFUSION? A transfusion is the replacement of blood or some of its parts. Blood is made up of multiple cells which provide different functions.  Red blood cells carry oxygen and are used for blood loss replacement.  White blood cells fight against infection.  Platelets control bleeding.  Plasma helps clot blood.  Other blood products are available for specialized needs, such as hemophilia or other clotting disorders. BEFORE THE TRANSFUSION  Who gives blood for transfusions?   You may be able to donate blood to be used at a later date on yourself (autologous donation).  Relatives can be asked to donate blood. This is generally not any safer than if you have received blood from a stranger. The same precautions are taken to ensure safety when a relative's blood is donated.  Healthy volunteers who are fully evaluated to make sure their blood is safe. This is blood bank blood. Transfusion therapy is the safest it has ever been in the practice of medicine. Before blood is taken from a donor, a complete history is taken  to make sure that person has no history of diseases nor engages in risky social behavior (examples are intravenous drug use or sexual activity with multiple partners). The donor's travel history is screened to minimize risk of transmitting infections, such as malaria. The donated blood is tested for signs of infectious diseases, such as HIV and hepatitis. The blood is then tested to be sure it is compatible with you in order to minimize the chance of a transfusion reaction. If you or a relative donates blood, this is often done in anticipation of surgery and is not appropriate for emergency situations. It takes many days to process the donated blood. RISKS AND COMPLICATIONS Although transfusion therapy is very safe and saves many lives, the main dangers of transfusion include:   Getting an infectious disease.  Developing a transfusion reaction. This is an allergic reaction to something in the blood you were given. Every precaution is taken to prevent this. The decision to have a blood transfusion has been considered carefully by your caregiver before blood is given. Blood is not  given unless the benefits outweigh the risks.  AFTER SURGERY INSTRUCTIONS 06/03/2019  Return to work: 4-6 weeks if applicable  Activity: 1. Be up and out of the bed during the day.  Take a nap if needed.  You may walk up steps but be careful and use the hand rail.  Stair climbing will tire you more than you think, you may need to stop part way and rest.   2. No lifting or straining for 6 weeks.  3. No driving for 1-2 week(s).  Do not drive if you are taking narcotic pain medicine.  4. Shower daily.  Use soap and water on your incision and pat dry; don't rub.  No tub baths until cleared by your surgeon.   5. No sexual activity and nothing in the vagina for 8 weeks.  6. You may experience a small amount of clear drainage from your incisions, which is normal.  If the drainage persists or increases, please call the  office.  7. You may experience vaginal spotting after surgery or around the 6-8 week mark from surgery when the stitches at the top of the vagina begin to dissolve.  The spotting is normal but if you experience heavy bleeding, call our office.  8. Take Tylenol or ibuprofen first for pain and only use narcotic pain medication for severe pain not relieved by the Tylenol or Ibuprofen.  Monitor your Tylenol intake to a max of 4,000 mg.  Diet: 1. Low sodium Heart Healthy Diet is recommended.  2. It is safe to use a laxative, such as Miralax or Colace, if you have difficulty moving your bowels. You can take Sennakot at bedtime every evening to keep bowel movements regular and to prevent constipation.    Wound Care: 1. Keep clean and dry.  Shower daily.  Reasons to call the Doctor:  Fever - Oral temperature greater than 100.4 degrees Fahrenheit  Foul-smelling vaginal discharge  Difficulty urinating  Nausea and vomiting  Increased pain at the site of the incision that is unrelieved with pain medicine.  Difficulty breathing with or without chest pain  New calf pain especially if only on one side  Sudden, continuing increased vaginal bleeding with or without clots.   Contacts: For questions or concerns you should contact:  Dr. Everitt Amber at [phone]  [name], NP at [phone]  After Hours: call [phone] and have the GYN Oncologist paged/contacted   Intrauterine Device Insertion An intrauterine device (IUD) is a medical device that gets inserted into the uterus to prevent pregnancy. It is a small, T-shaped device that has one or two nylon strings hanging down from it. The strings hang out of the lower part of the uterus (cervix) to allow for future IUD removal. There are two types of IUDs available:  Copper IUD. This type of IUD has copper wire wrapped around it. Copper makes the uterus and fallopian tubes produce a fluid that kills sperm. A copper IUD may last up to  10 years.  Hormone IUD. This type of IUD is made of plastic and contains the hormone progestin (synthetic progesterone). The hormone thickens mucus in the cervix and prevents sperm from entering the uterus. It also thins the uterine lining to prevent implantation of a fertilized egg. The hormone can weaken or kill the sperm that get into the uterus. A hormone IUD may last 3-5 years. Tell a health care provider about:  Any allergies you have.  All medicines you are taking, including vitamins, herbs, eye drops, creams,  and over-the-counter medicines.  Any problems you or family members have had with anesthetic medicines.  Any blood disorders you have.  Any surgeries you have had.  Any medical conditions you have, including any STIs (sexually transmitted infections) you may have.  Whether you are pregnant or may be pregnant. What are the risks? Generally, this is a safe procedure. However, problems may occur, including:  Infection.  Bleeding.  Allergic reactions to medicines.  Accidental puncture (perforation) of the uterus, or damage to other structures or organs.  Accidental placement of the IUD either in the muscle layer of the uterus (myometrium) or outside the uterus.  The IUD falling out of the uterus (expulsion). This is more common among women who have recently had a child.  Pregnancy that happens in the fallopian tube (ectopic pregnancy).  Infection of the uterus and fallopian tubes (pelvic inflammatory disease). What happens before the procedure?  Schedule the IUD insertion for when you will have your menstrual period or right after, to make sure you are not pregnant. Placement of the IUD is better tolerated shortly after a menstrual cycle.  Follow instructions from your health care provider about eating or drinking restrictions.  Ask your health care provider about changing or stopping your regular medicines. This is especially important if you are taking diabetes  medicines or blood thinners.  You may get a pain reliever to take before the procedure.  You may have tests for:  Pregnancy. A pregnancy test involves having a urine sample taken.  STIs. Placing an IUD in someone who has an STI can make the infection worse.  Cervical cancer. You may have a Pap test to check for this type of cancer. This means collecting cells from your cervix to be examined under a microscope.  You may have a physical exam to determine the size and position of your uterus. The procedure may vary among health care providers and hospitals. What happens during the procedure?  A tool (speculum) will be placed in your vagina and widened so that your health care provider can see your cervix.  Medicine may be applied to your cervix to help lower your risk of infection (antiseptic medicine).  You may be given an anesthetic medicine to numb each side of your cervix (intracervical block or paracervical block). This medicine is usually given by an injection into the cervix.  A tool (uterine sound) will be inserted into your uterus to determine the length of your uterus and the direction that your uterus may be tilted.  A slim instrument or tube (IUD inserter) that holds the IUD will be inserted into your vagina, through your cervical canal, and into your uterus.  The IUD will be placed in the uterus, and the IUD inserter will be removed.  The strings that are attached to the IUD will be trimmed so that they lie just below the cervix. The procedure may vary among health care providers and hospitals. What happens after the procedure?  You may have bleeding after the procedure. This is normal. It varies from light bleeding (spotting) for a few days to menstrual-like bleeding.  You may have cramping and pain.  You may feel dizzy or light-headed.  You may have lower back pain. Summary  An intrauterine device (IUD) is a small, T-shaped device that has one or two nylon strings  hanging down from it.  Two types of IUDs are available. You may have a copper IUD or a hormone IUD.  Schedule the IUD insertion for  when you will have your menstrual period or right after, to make sure you are not pregnant. Placement of the IUD is better tolerated shortly after a menstrual cycle.  You may have bleeding after the procedure. This is normal. It varies from light spotting for a few days to menstrual-like bleeding. This information is not intended to replace advice given to you by your health care provider. Make sure you discuss any questions you have with your health care provider. Document Released: 02/28/2011 Document Revised: 05/23/2016 Document Reviewed: 05/23/2016 Elsevier Interactive Patient Education  2017 [employer].  Levonorgestrel intrauterine device (IUD) What is this medicine? LEVONORGESTREL IUD (LEE voe nor jes trel) is a contraceptive (birth control) device. The device is placed inside the uterus by a healthcare professional. It is used to prevent pregnancy. This device can also be used to treat heavy bleeding that occurs during your period. This medicine may be used for other purposes; ask your health care provider or pharmacist if you have questions. COMMON BRAND NAME(S): Minette Headland What should I tell my health care provider before I take this medicine? They need to know if you have any of these conditions: -abnormal Pap smear -cancer of the breast, uterus, or cervix -diabetes -endometritis -genital or pelvic infection now or in the past -have more than one sexual partner or your partner has more than one partner -heart disease -history of an ectopic or tubal pregnancy -immune system problems -IUD in place -liver disease or tumor -problems with blood clots or take blood-thinners -seizures -use intravenous drugs -uterus of unusual shape -vaginal bleeding that has not been explained -an unusual or allergic reaction to  levonorgestrel, other hormones, silicone, or polyethylene, medicines, foods, dyes, or preservatives -pregnant or trying to get pregnant -breast-feeding How should I use this medicine? This device is placed inside the uterus by a health care professional. Talk to your pediatrician regarding the use of this medicine in children. Special care may be needed. Overdosage: If you think you have taken too much of this medicine contact a poison control center or emergency room at once. NOTE: This medicine is only for you. Do not share this medicine with others. What if I miss a dose? This does not apply. Depending on the brand of device you have inserted, the device will need to be replaced every 3 to 5 years if you wish to continue using this type of birth control. What may interact with this medicine? Do not take this medicine with any of the following medications: -amprenavir -bosentan -fosamprenavir This medicine may also interact with the following medications: -aprepitant -armodafinil -barbiturate medicines for inducing sleep or treating seizures -bexarotene -boceprevir -griseofulvin -medicines to treat seizures like carbamazepine, ethotoin, felbamate, oxcarbazepine, phenytoin, topiramate -modafinil -pioglitazone -rifabutin -rifampin -rifapentine -some medicines to treat HIV infection like atazanavir, efavirenz, indinavir, lopinavir, nelfinavir, tipranavir, ritonavir -St. John's wort -warfarin This list may not describe all possible interactions. Give your health care provider a list of all the medicines, herbs, non-prescription drugs, or dietary supplements you use. Also tell them if you smoke, drink alcohol, or use illegal drugs. Some items may interact with your medicine. What should I watch for while using this medicine? Visit your doctor or health care professional for regular check ups. See your doctor if you or your partner has sexual contact with others, becomes HIV positive, or  gets a sexual transmitted disease. This product does not protect you against HIV infection (AIDS) or other sexually transmitted diseases. You can check the  placement of the IUD yourself by reaching up to the top of your vagina with clean fingers to feel the threads. Do not pull on the threads. It is a good habit to check placement after each menstrual period. Call your doctor right away if you feel more of the IUD than just the threads or if you cannot feel the threads at all. The IUD may come out by itself. You may become pregnant if the device comes out. If you notice that the IUD has come out use a backup birth control method like condoms and call your health care provider. Using tampons will not change the position of the IUD and are okay to use during your period. This IUD can be safely scanned with magnetic resonance imaging (MRI) only under specific conditions. Before you have an MRI, tell your healthcare provider that you have an IUD in place, and which type of IUD you have in place. What side effects may I notice from receiving this medicine? Side effects that you should report to your doctor or health care professional as soon as possible: -allergic reactions like skin rash, itching or hives, swelling of the face, lips, or tongue -fever, flu-like symptoms -genital sores -high blood pressure -no menstrual period for 6 weeks during use -pain, swelling, warmth in the leg -pelvic pain or tenderness -severe or sudden headache -signs of pregnancy -stomach cramping -sudden shortness of breath -trouble with balance, talking, or walking -unusual vaginal bleeding, discharge -yellowing of the eyes or skin Side effects that usually do not require medical attention (report to your doctor or health care professional if they continue or are bothersome): -acne -breast pain -change in sex drive or performance -changes in weight -cramping, dizziness, or faintness while the device is being inserted  -headache -irregular menstrual bleeding within first 3 to 6 months of use -nausea This list may not describe all possible side effects. Call your doctor for medical advice about side effects. You may report side effects to FDA at 1-800-FDA-1088. Where should I keep my medicine? This does not apply. NOTE: This sheet is a summary. It may not cover all possible information. If you have questions about this medicine, talk to your doctor, pharmacist, or health care provider.  2018 Elsevier/Gold Standard (2016-04-13 14:14:56)

## 2019-06-03 NOTE — Progress Notes (Signed)
Records and request for consult sent to Dr. Rosendo Gros.

## 2019-06-05 ENCOUNTER — Other Ambulatory Visit: Payer: Self-pay | Admitting: Gynecologic Oncology

## 2019-06-05 ENCOUNTER — Telehealth: Payer: Self-pay

## 2019-06-05 DIAGNOSIS — C541 Malignant neoplasm of endometrium: Secondary | ICD-10-CM

## 2019-06-05 MED ORDER — LEVONORGESTREL 20 MCG/24HR IU IUD: 1.0000 | INTRAUTERINE_SYSTEM | Freq: Once | INTRAUTERINE | 0 refills | Status: AC

## 2019-06-05 NOTE — Telephone Encounter (Signed)
Told Danielle Stephenson that the insurance aprroved the IUD.  Her co-pay for the IUD is $300.00. She does want to proceed with the IUD as the back up plan if hysterectomy surgery has to be aborted. She needs time to get the funds.  She will have the funds ~06-29-19. Will follow up with patient ~06-29-19 and confirm funds and discuss how to make payment with [facility].

## 2019-06-09 ENCOUNTER — Telehealth: Payer: Self-pay | Admitting: *Deleted

## 2019-06-09 NOTE — Telephone Encounter (Signed)
Patient to saee Dr. Rosendo Gros on 12/7

## 2019-06-24 ENCOUNTER — Telehealth: Payer: Self-pay | Admitting: Gynecologic Oncology

## 2019-06-24 NOTE — Telephone Encounter (Signed)
Called patient to follow up with her after her visit with Dr. Rosendo Gros.  She states she has moved out of her daughter's house and will not have to care for 4 children so she feels her stress level will decrease.  She states she plans on beginning an exercise/healthy eating routine at the beginning on the year.  Advised her that given the fact a hernia repair cannot be performed until significant weight loss, the robotic assisted total hysterectomy, BSO, SLN Bx would have to be placed on hold since the hernia would need to be addressed at the time of that surgery as well.  Advised her of Dr. Serita Grit recommendations to proceed with the alternative treatment discussed at her last visit being a dilation and curettage of the uterus with Mirena intrauterine device placement.  She verbalizing understanding.  Advised her that I would discuss dates for the procedure with Dr. Denman George and give her a call back tomorrow or Friday to discuss.  No questions voiced. Advised to call for an needs in between that time.

## 2019-06-25 NOTE — Telephone Encounter (Signed)
Spoke with Danielle Stephenson and told her that the surgery will be either 07-22-19 or 07-27-19.  [name], NP will keep her posted as to which date will be chosen due to availability. She will need to call the [facility] patient pharmacy at [phone] prior to 07-15-19 to pay her co-pay of 300.00. Pt verbalized understanding.

## 2019-07-14 ENCOUNTER — Telehealth: Payer: Self-pay

## 2019-07-14 NOTE — Telephone Encounter (Signed)
I made multiple attempts at contacting the patient to discuss her surgery date.  She did not answer and I was unable to leave a message.

## 2019-07-14 NOTE — Progress Notes (Signed)
Please place orders in epic for upcoming surgery , Thank you!

## 2019-07-15 ENCOUNTER — Telehealth: Payer: Self-pay

## 2019-07-15 NOTE — Telephone Encounter (Signed)
I spoke with Ms ferrante this am.  I told her Dr. Denman George had scheduled her surgery for 07/22/2019.  Ms Scheffler has declined.  She states she is getting a second opinion on 07/24/2019.

## 2019-07-22 ENCOUNTER — Ambulatory Visit: Admit: 2019-07-22 | Payer: Medicare Other | Admitting: Gynecologic Oncology

## 2019-07-22 SURGERY — DILATION AND CURETTAGE
Anesthesia: Choice

## 2019-07-24 ENCOUNTER — Telehealth: Payer: Self-pay

## 2019-07-24 NOTE — Telephone Encounter (Signed)
Call could not be completed at this time.  Will try again later.

## 2019-07-24 NOTE — Telephone Encounter (Signed)
Told Danielle Stephenson that her procedure is scheduled for 08-20-19 at [facility] main hospital . She needs to call [facility] to pay $300.00 co pay prior to procedure. Pre -operative testing will call her and give appointment for ecoid testing and review pre-op instructions. Pt verbalized understanding

## 2019-08-12 ENCOUNTER — Telehealth: Payer: Self-pay

## 2019-08-12 NOTE — Patient Instructions (Addendum)
DUE TO COVID-19 ONLY ONE VISITOR IS ALLOWED TO COME WITH YOU AND STAY IN THE WAITING ROOM ONLY DURING PRE OP AND PROCEDURE DAY OF SURGERY. THE 1 VISITOR MAY VISIT WITH YOU AFTER SURGERY IN YOUR PRIVATE ROOM DURING VISITING HOURS ONLY!  YOU NEED TO HAVE A COVID 19 TEST ON_2/1______ @_11 :40______, THIS TEST MUST BE DONE BEFORE SURGERY, COME  [address], [place] , [address].  ([place]) ONCE YOUR COVID TEST IS COMPLETED, PLEASE BEGIN THE QUARANTINE INSTRUCTIONS AS OUTLINED IN YOUR HANDOUT.                KIYANI SCHMUCKER   Your procedure is scheduled on: 08/20/19   Report to [facility] Main  Entrance    Report to Short Stay at 5:30  AM    Call this number if you have problems the morning of surgery [phone]    Remember: Do not eat food or drink liquids after [place], NO CHEWING GUM [place].   Bring CPAP mask and tubing day of surgery    Take these medicines the morning of surgery with A SIP OF WATER: Fluoxetine(Prozac), Amlodipine(Norvasc)                                                                                                                                 DO NOT TAKE ANY DIABETIC MEDICATIONS DAY OF YOUR SURGERY How to Manage Your Diabetes Before and After Surgery  Why is it important to control my blood sugar before and after surgery? . Improving blood sugar levels before and after surgery helps healing and can limit problems. . A way of improving blood sugar control is eating a healthy diet by: o  Eating less sugar and carbohydrates o  Increasing activity/exercise o  Talking with your doctor about reaching your blood sugar goals . High blood sugars (greater than 180 mg/dL) can raise your risk of infections and slow your recovery, so you will need to focus on controlling your diabetes during the weeks before surgery. . Make sure that the doctor who takes care of your  diabetes knows about your planned surgery including the date and location.  How do I manage my blood sugar before surgery? . Check your blood sugar at least 4 times a day, starting 2 days before surgery, to make sure that the level is not too high or low. o Check your blood sugar the morning of your surgery when you wake up and every 2 hours until you get to the Short Stay unit. . If your blood sugar is less than 70 mg/dL, you will need to treat for low blood sugar: o Do not take insulin. o Treat a low blood sugar (less than 70 mg/dL) with  cup of clear juice (cranberry or apple), 4 glucose tablets, OR glucose gel. o Recheck blood sugar in  15 minutes after treatment (to make sure it is greater than 70 mg/dL). If your blood sugar is not greater than 70 mg/dL on recheck, call [phone] for further instructions. . Report your blood sugar to the short stay nurse when you get to Short Stay.  . If you are admitted to the hospital after surgery: o Your blood sugar will be checked by the staff and you will probably be given insulin after surgery (instead of oral diabetes medicines) to make sure you have good blood sugar levels. o The goal for blood sugar control after surgery is 80-180 mg/dL.   WHAT DO I DO ABOUT MY DIABETES MEDICATION?       Marland Kitchen The day of surgery, do not take other diabetes injectables, including Byetta (exenatide), Bydureon (exenatide ER), Victoza (liraglutide), or Trulicity (dulaglutide).                                  You may not have any metal on your body including hair pins and              piercings  Do not wear jewelry, make-up, lotions, powders or perfumes, deodorant             Do not wear nail polish on your fingernails.  Do not shave  48 hours prior to surgery.              Do not bring valuables to the hospital. [place].  Contacts, dentures or bridgework may not be worn into surgery.      Patients  discharged the day of surgery will not be allowed to drive home.   IF YOU ARE HAVING SURGERY AND GOING HOME THE SAME DAY, YOU MUST HAVE AN ADULT TO DRIVE YOU HOME AND BE WITH YOU FOR 24 HOURS.  YOU MAY GO HOME BY TAXI OR UBER OR ORTHERWISE, BUT AN ADULT MUST ACCOMPANY YOU HOME AND STAY WITH YOU FOR 24 HOURS.  Name and phone number of your driver:  Special Instructions: N/A              Please read over the following fact sheets you were given: _____________________________________________________________________             [facility] - Preparing for Surgery  Before surgery, you can play an important role.   Because skin is not sterile, your skin needs to be as free of germs as possible.   You can reduce the number of germs on your skin by washing with CHG (chlorahexidine gluconate) soap before surgery .  CHG is an antiseptic cleaner which kills germs and bonds with the skin to continue killing germs even after washing. Please DO NOT use if you have an allergy to CHG or antibacterial soaps.   If your skin becomes reddened/irritated stop using the CHG and inform your nurse when you arrive at Short Stay. Do not shave (including legs and underarms) for at least 48 hours prior to the first CHG shower.    Please follow these instructions carefully:  1.  Shower with CHG Soap the night before surgery and the  morning of Surgery.  2.  If you choose to wash your hair, wash your hair first as usual with your  normal  shampoo.  3.  After you shampoo, rinse your hair and  body thoroughly to remove the  shampoo.                                        4.  Use CHG as you would any other liquid soap.  You can apply chg directly  to the skin and wash                       Gently with a scrungie or clean washcloth.  5.  Apply the CHG Soap to your body ONLY FROM THE NECK DOWN.   Do not use on face/ open                           Wound or open sores. Avoid contact with eyes, ears mouth and genitals  (private parts).                       Wash face,  Genitals (private parts) with your normal soap.             6.  Wash thoroughly, paying special attention to the area where your surgery  will be performed.  7.  Thoroughly rinse your body with warm water from the neck down.  8.  DO NOT shower/wash with your normal soap after using and rinsing off  the CHG Soap.             9.  Pat yourself dry with a clean towel.            10.  Wear clean pajamas.            11.  Place clean sheets on your bed the night of your first shower and do not  sleep with pets. Day of Surgery : Do not apply any lotions/deodorants the morning of surgery.  Please wear clean clothes to the hospital/surgery center.  FAILURE TO FOLLOW THESE INSTRUCTIONS MAY RESULT IN THE CANCELLATION OF YOUR SURGERY PATIENT SIGNATURE_________________________________  NURSE SIGNATURE__________________________________  ________________________________________________________________________   Adam Phenix  An incentive spirometer is a tool that can help keep your lungs clear and active. This tool measures how well you are filling your lungs with each breath. Taking long deep breaths may help reverse or decrease the chance of developing breathing (pulmonary) problems (especially infection) following:  A long period of time when you are unable to move or be active. BEFORE THE PROCEDURE   If the spirometer includes an indicator to show your best effort, your nurse or respiratory therapist will set it to a desired goal.  If possible, sit up straight or lean slightly forward. Try not to slouch.  Hold the incentive spirometer in an upright position. INSTRUCTIONS FOR USE  1. Sit on the edge of your bed if possible, or sit up as far as you can in bed or on a chair. 2. Hold the incentive spirometer in an upright position. 3. Breathe out normally. 4. Place the mouthpiece in your mouth and seal your lips tightly around  it. 5. Breathe in slowly and as deeply as possible, raising the piston or the ball toward the top of the column. 6. Hold your breath for 3-5 seconds or for as long as possible. Allow the piston or ball to fall to the bottom of the column. 7. Remove the mouthpiece from your mouth and breathe out normally.  8. Rest for a few seconds and repeat Steps 1 through 7 at least 10 times every 1-2 hours when you are awake. Take your time and take a few normal breaths between deep breaths. 9. The spirometer may include an indicator to show your best effort. Use the indicator as a goal to work toward during each repetition. 10. After each set of 10 deep breaths, practice coughing to be sure your lungs are clear. If you have an incision (the cut made at the time of surgery), support your incision when coughing by placing a pillow or rolled up towels firmly against it. Once you are able to get out of bed, walk around indoors and cough well. You may stop using the incentive spirometer when instructed by your caregiver.  RISKS AND COMPLICATIONS  Take your time so you do not get dizzy or light-headed.  If you are in pain, you may need to take or ask for pain medication before doing incentive spirometry. It is harder to take a deep breath if you are having pain. AFTER USE  Rest and breathe slowly and easily.  It can be helpful to keep track of a log of your progress. Your caregiver can provide you with a simple table to help with this. If you are using the spirometer at home, follow these instructions: [place] IF:   You are having difficultly using the spirometer.  You have trouble using the spirometer as often as instructed.  Your pain medication is not giving enough relief while using the spirometer.  You develop fever of 100.5 F (38.1 C) or higher. SEEK IMMEDIATE MEDICAL CARE IF:   You cough up bloody sputum that had not been present before.  You develop fever of 102 F (38.9 C) or  greater.  You develop worsening pain at or near the incision site. MAKE SURE YOU:   Understand these instructions.  Will watch your condition.  Will get help right away if you are not doing well or get worse. Document Released: 11/12/2006 Document Revised: 09/24/2011 Document Reviewed: 01/13/2007 [facility] Patient Information 2014 [place], [place].   ________________________________________________________________________

## 2019-08-12 NOTE — Telephone Encounter (Signed)
Ms Lammert states that she will call [facility] on 08-18-19 to pay her co-pay of 300.00. Notified [facility] of patient's plan.

## 2019-08-17 ENCOUNTER — Encounter (HOSPITAL_COMMUNITY): Payer: Self-pay

## 2019-08-17 ENCOUNTER — Other Ambulatory Visit (HOSPITAL_COMMUNITY)
Admission: RE | Admit: 2019-08-17 | Discharge: 2019-08-17 | Disposition: A | Discharge status: Home / Self Care | Payer: Medicare Other | Source: Ambulatory Visit | Attending: Gynecologic Oncology | Admitting: Gynecologic Oncology

## 2019-08-17 ENCOUNTER — Encounter (HOSPITAL_COMMUNITY)
Admission: RE | Admit: 2019-08-17 | Discharge: 2019-08-17 | Disposition: A | Discharge status: Home / Self Care | Payer: Medicare Other | Source: Ambulatory Visit | Attending: Gynecologic Oncology | Admitting: Gynecologic Oncology

## 2019-08-17 ENCOUNTER — Other Ambulatory Visit: Payer: Self-pay

## 2019-08-17 DIAGNOSIS — Z20822 Contact with and (suspected) exposure to covid-19: Secondary | ICD-10-CM | POA: Diagnosis not present

## 2019-08-17 DIAGNOSIS — Z01812 Encounter for preprocedural laboratory examination: Secondary | ICD-10-CM | POA: Diagnosis present

## 2019-08-17 HISTORY — DX: Vitamin D deficiency, unspecified: E55.9

## 2019-08-17 HISTORY — DX: Malignant neoplasm of endometrium: C54.1

## 2019-08-17 HISTORY — DX: Partial intestinal obstruction, unspecified as to cause: K56.600

## 2019-08-17 HISTORY — DX: Infection following a procedure, other surgical site, initial encounter: T81.49XA

## 2019-08-17 HISTORY — DX: Fatty (change of) liver, not elsewhere classified: K76.0

## 2019-08-17 HISTORY — DX: Personal history of other diseases of the nervous system and sense organs: Z86.69

## 2019-08-17 HISTORY — DX: Hyperlipidemia, unspecified: E78.5

## 2019-08-17 LAB — COMPREHENSIVE METABOLIC PANEL
ALT: 23 U/L (ref 0–44)
AST: 23 U/L (ref 15–41)
Albumin: 4 g/dL (ref 3.5–5.0)
Alkaline Phosphatase: 64 U/L (ref 38–126)
Anion gap: 10 (ref 5–15)
BUN: 18 mg/dL (ref 8–23)
CO2: 24 mmol/L (ref 22–32)
Calcium: 9.7 mg/dL (ref 8.9–10.3)
Chloride: 106 mmol/L (ref 98–111)
Creatinine, Ser: 0.82 mg/dL (ref 0.44–1.00)
GFR calc Af Amer: >60 mL/min (ref >60)
GFR calc non Af Amer: >60 mL/min (ref >60)
Glucose, Bld: 118 mg/dL — ABNORMAL HIGH (ref 70–99)
Potassium: 4.1 mmol/L (ref 3.5–5.1)
Sodium: 140 mmol/L (ref 135–145)
Total Bilirubin: 0.9 mg/dL (ref 0.3–1.2)
Total Protein: 7.9 g/dL (ref 6.5–8.1)

## 2019-08-17 LAB — CBC
HCT: 46.2 % — ABNORMAL HIGH (ref 36.0–46.0)
Hemoglobin: 14.7 g/dL (ref 12.0–15.0)
MCH: 29.6 pg (ref 26.0–34.0)
MCHC: 31.8 g/dL (ref 30.0–36.0)
MCV: 93 fL (ref 80.0–100.0)
Platelets: 349 10*3/uL (ref 150–400)
RBC: 4.97 MIL/uL (ref 3.87–5.11)
RDW: 13.2 % (ref 11.5–15.5)
WBC: 10.6 10*3/uL — ABNORMAL HIGH (ref 4.0–10.5)
nRBC: 0 % (ref 0.0–0.2)

## 2019-08-17 LAB — GLUCOSE, CAPILLARY: Glucose-Capillary: 107 mg/dL — ABNORMAL HIGH (ref 70–99)

## 2019-08-17 LAB — SARS CORONAVIRUS 2 (TAT 6-24 HRS): SARS Coronavirus 2: NEGATIVE

## 2019-08-17 LAB — HEMOGLOBIN A1C
Hgb A1c MFr Bld: 6.3 % — ABNORMAL HIGH (ref 4.8–5.6)
Mean Plasma Glucose: 134.11 mg/dL

## 2019-08-17 NOTE — Progress Notes (Addendum)
PCP - [facility] FNP Cardiologist - Dr. Reuel Boom Last office visit 08/13/2017  Chest x-ray - 03/2019 in chart EKG - 03/2019 in chart Stress Test - N/A ECHO - greater than 2 years ago Cardiac Cath - N/A  Sleep Study - 12/2012 CPAP - Yes  Fasting Blood Sugar - 90-110 Checks Blood Sugar  Once daily and as needed  Blood Thinner Instructions:N/A Aspirin Instructions: N/A Last Dose: N/A  Anesthesia review: PFO, DM, HTN, Morbid obesity  Patient denies shortness of breath, fever, cough and chest pain at PAT appointment   Patient verbalized understanding of instructions that were given to them at the PAT appointment. Patient was also instructed that they will need to review over the PAT instructions again at home before surgery.

## 2019-08-18 MED FILL — MIRENA SYSTEM: 20 | 90 days supply | Qty: 1 | Fill #0

## 2019-08-19 ENCOUNTER — Encounter (HOSPITAL_COMMUNITY): Payer: Self-pay | Admitting: Gynecologic Oncology

## 2019-08-19 ENCOUNTER — Telehealth: Payer: Self-pay

## 2019-08-19 NOTE — Telephone Encounter (Signed)
Ms Danielle Stephenson stated that she understands her pr op instructions and does not have any questions or concerns at this time.

## 2019-08-19 NOTE — Anesthesia Preprocedure Evaluation (Addendum)
Anesthesia Evaluation  Patient identified by MRN, date of birth, ID band Patient awake    Reviewed: Allergy & Precautions, NPO status , Patient's Chart, lab work & pertinent test results  Airway Mallampati: II  TM Distance: >3 FB Neck ROM: Full    Dental  (+) Teeth Intact, Dental Advisory Given, Caps,    Pulmonary asthma , sleep apnea and Continuous Positive Airway Pressure Ventilation ,    Pulmonary exam normal breath sounds clear to auscultation       Cardiovascular hypertension, Pt. on medications (-) angina(-) Past MI Normal cardiovascular exam Rhythm:Regular Rate:Normal     Neuro/Psych  Headaches, PSYCHIATRIC DISORDERS Depression    GI/Hepatic negative GI ROS, (+) Cirrhosis       ,   Endo/Other  diabetes, Type 2, Oral Hypoglycemic AgentsMorbid obesity (BMI 56.57)  Renal/GU negative Renal ROS     Musculoskeletal  (+) Arthritis ,   Abdominal   Peds  Hematology negative hematology ROS (+)   Anesthesia Other Findings Day of surgery medications reviewed with the patient.  Reproductive/Obstetrics ENDOMETRIAL CANCER                           Anesthesia Physical Anesthesia Plan  ASA: IV  Anesthesia Plan: General   Post-op Pain Management:    Induction: Intravenous  PONV Risk Score and Plan: 4 or greater and Dexamethasone, Ondansetron and Treatment may vary due to age or medical condition  Airway Management Planned: Oral ETT  Additional Equipment:   Intra-op Plan:   Post-operative Plan: Extubation in OR  Informed Consent: I have reviewed the patients History and Physical, chart, labs and discussed the procedure including the risks, benefits and alternatives for the proposed anesthesia with the patient or authorized representative who has indicated his/her understanding and acceptance.     Dental advisory given  Plan Discussed with: CRNA  Anesthesia Plan Comments:         Anesthesia Quick Evaluation

## 2019-08-20 ENCOUNTER — Ambulatory Visit (HOSPITAL_COMMUNITY): Payer: Medicare Other | Admitting: Anesthesiology

## 2019-08-20 ENCOUNTER — Ambulatory Visit (HOSPITAL_COMMUNITY)
Admission: RE | Admit: 2019-08-20 | Discharge: 2019-08-20 | Disposition: A | Discharge status: Home / Self Care | Payer: Medicare Other | Attending: Gynecologic Oncology | Admitting: Gynecologic Oncology

## 2019-08-20 ENCOUNTER — Ambulatory Visit (HOSPITAL_COMMUNITY): Payer: Medicare Other | Admitting: Physician Assistant

## 2019-08-20 ENCOUNTER — Encounter (HOSPITAL_COMMUNITY): Payer: Self-pay | Admitting: Gynecologic Oncology

## 2019-08-20 ENCOUNTER — Encounter (HOSPITAL_COMMUNITY)
Admission: RE | Disposition: A | Discharge status: Home / Self Care | Payer: Self-pay | Source: Home / Self Care | Attending: Gynecologic Oncology

## 2019-08-20 ENCOUNTER — Other Ambulatory Visit: Payer: Self-pay

## 2019-08-20 DIAGNOSIS — E785 Hyperlipidemia, unspecified: Secondary | ICD-10-CM | POA: Diagnosis not present

## 2019-08-20 DIAGNOSIS — J45909 Unspecified asthma, uncomplicated: Secondary | ICD-10-CM | POA: Diagnosis not present

## 2019-08-20 DIAGNOSIS — Z7951 Long term (current) use of inhaled steroids: Secondary | ICD-10-CM | POA: Insufficient documentation

## 2019-08-20 DIAGNOSIS — C541 Malignant neoplasm of endometrium: Secondary | ICD-10-CM | POA: Diagnosis present

## 2019-08-20 DIAGNOSIS — Z9049 Acquired absence of other specified parts of digestive tract: Secondary | ICD-10-CM | POA: Insufficient documentation

## 2019-08-20 DIAGNOSIS — G4733 Obstructive sleep apnea (adult) (pediatric): Secondary | ICD-10-CM | POA: Diagnosis not present

## 2019-08-20 DIAGNOSIS — K7460 Unspecified cirrhosis of liver: Secondary | ICD-10-CM | POA: Diagnosis not present

## 2019-08-20 DIAGNOSIS — I10 Essential (primary) hypertension: Secondary | ICD-10-CM | POA: Insufficient documentation

## 2019-08-20 DIAGNOSIS — Z96652 Presence of left artificial knee joint: Secondary | ICD-10-CM | POA: Diagnosis not present

## 2019-08-20 DIAGNOSIS — K439 Ventral hernia without obstruction or gangrene: Secondary | ICD-10-CM | POA: Diagnosis present

## 2019-08-20 DIAGNOSIS — Z79899 Other long term (current) drug therapy: Secondary | ICD-10-CM | POA: Insufficient documentation

## 2019-08-20 DIAGNOSIS — Z6841 Body Mass Index (BMI) 40.0 and over, adult: Secondary | ICD-10-CM | POA: Insufficient documentation

## 2019-08-20 DIAGNOSIS — E119 Type 2 diabetes mellitus without complications: Secondary | ICD-10-CM | POA: Insufficient documentation

## 2019-08-20 DIAGNOSIS — F329 Major depressive disorder, single episode, unspecified: Secondary | ICD-10-CM | POA: Diagnosis not present

## 2019-08-20 DIAGNOSIS — Z9884 Bariatric surgery status: Secondary | ICD-10-CM | POA: Diagnosis not present

## 2019-08-20 HISTORY — PX: DILATION AND CURETTAGE OF UTERUS: SHX78

## 2019-08-20 HISTORY — PX: INTRAUTERINE DEVICE (IUD) INSERTION: SHX5877

## 2019-08-20 LAB — GLUCOSE, CAPILLARY
Glucose-Capillary: 145 mg/dL — ABNORMAL HIGH (ref 70–99)
Glucose-Capillary: 126 mg/dL — ABNORMAL HIGH (ref 70–99)

## 2019-08-20 SURGERY — DILATION AND CURETTAGE
Anesthesia: General

## 2019-08-20 MED ORDER — PHENYLEPHRINE HCL (PRESSORS) 10 MG/ML IV SOLN
INTRAVENOUS | Status: DC | PRN
  Administered 2019-08-20: 120 ug via INTRAVENOUS

## 2019-08-20 MED ORDER — LIDOCAINE-EPINEPHRINE 1 %-1:100000 IJ SOLN
INTRAMUSCULAR | Status: AC
  Filled 2019-08-20: qty 1

## 2019-08-20 MED ORDER — LIDOCAINE HCL (CARDIAC) PF 100 MG/5ML IV SOSY
PREFILLED_SYRINGE | INTRAVENOUS | Status: DC | PRN
  Administered 2019-08-20: 100 mg via INTRAVENOUS

## 2019-08-20 MED ORDER — LIDOCAINE 2% (20 MG/ML) 5 ML SYRINGE
INTRAMUSCULAR | Status: AC
  Filled 2019-08-20: qty 10

## 2019-08-20 MED ORDER — HYDROMORPHONE HCL 1 MG/ML IJ SOLN: 0.2000 mg | INTRAMUSCULAR | Status: DC | PRN

## 2019-08-20 MED ORDER — GLYCOPYRROLATE PF 0.2 MG/ML IJ SOSY
PREFILLED_SYRINGE | INTRAMUSCULAR | Status: DC | PRN
  Administered 2019-08-20: .2 mg via INTRAVENOUS

## 2019-08-20 MED ORDER — FENTANYL CITRATE (PF) 100 MCG/2ML IJ SOLN
25.0000 ug | INTRAMUSCULAR | Status: DC | PRN
  Administered 2019-08-20: 50 ug via INTRAVENOUS

## 2019-08-20 MED ORDER — ACETAMINOPHEN 500 MG PO TABS
ORAL_TABLET | ORAL | Status: AC
  Filled 2019-08-20: qty 2

## 2019-08-20 MED ORDER — FENTANYL CITRATE (PF) 100 MCG/2ML IJ SOLN
INTRAMUSCULAR | Status: DC | PRN
  Administered 2019-08-20: 100 ug via INTRAVENOUS

## 2019-08-20 MED ORDER — PROPOFOL 10 MG/ML IV BOLUS
INTRAVENOUS | Status: DC | PRN
  Administered 2019-08-20: 140 mg via INTRAVENOUS

## 2019-08-20 MED ORDER — FENTANYL CITRATE (PF) 100 MCG/2ML IJ SOLN
INTRAMUSCULAR | Status: AC
  Filled 2019-08-20: qty 2

## 2019-08-20 MED ORDER — ONDANSETRON HCL 4 MG/2ML IJ SOLN
INTRAMUSCULAR | Status: AC
  Filled 2019-08-20: qty 4

## 2019-08-20 MED ORDER — OXYCODONE HCL 5 MG PO TABS
ORAL_TABLET | ORAL | Status: AC
  Filled 2019-08-20: qty 1

## 2019-08-20 MED ORDER — LACTATED RINGERS IV SOLN: INTRAVENOUS | Status: DC

## 2019-08-20 MED ORDER — KETOROLAC TROMETHAMINE 30 MG/ML IJ SOLN
INTRAMUSCULAR | Status: AC
  Administered 2019-08-20: 09:00:00 30 mg via INTRAVENOUS
  Filled 2019-08-20: qty 1

## 2019-08-20 MED ORDER — PROPOFOL 10 MG/ML IV BOLUS
INTRAVENOUS | Status: AC
  Filled 2019-08-20: qty 40

## 2019-08-20 MED ORDER — FENTANYL CITRATE (PF) 100 MCG/2ML IJ SOLN
INTRAMUSCULAR | Status: AC
  Administered 2019-08-20: 50 ug via INTRAVENOUS
  Filled 2019-08-20: qty 2

## 2019-08-20 MED ORDER — DEXAMETHASONE SODIUM PHOSPHATE 10 MG/ML IJ SOLN
INTRAMUSCULAR | Status: AC
  Filled 2019-08-20: qty 2

## 2019-08-20 MED ORDER — LIDOCAINE HCL (PF) 1 % IJ SOLN
INTRAMUSCULAR | Status: AC
  Filled 2019-08-20: qty 30

## 2019-08-20 MED ORDER — KETOROLAC TROMETHAMINE 30 MG/ML IJ SOLN: 30.0000 mg | Freq: Once | INTRAMUSCULAR | Status: AC

## 2019-08-20 MED ORDER — 0.9 % SODIUM CHLORIDE (POUR BTL) OPTIME
TOPICAL | Status: DC | PRN
  Administered 2019-08-20: 1000 mL

## 2019-08-20 MED ORDER — EPHEDRINE SULFATE 50 MG/ML IJ SOLN
INTRAMUSCULAR | Status: DC | PRN
  Administered 2019-08-20: 5 mg via INTRAVENOUS

## 2019-08-20 MED ORDER — SUCCINYLCHOLINE CHLORIDE 20 MG/ML IJ SOLN
INTRAMUSCULAR | Status: DC | PRN
  Administered 2019-08-20: 160 mg via INTRAVENOUS

## 2019-08-20 MED ORDER — ONDANSETRON HCL 4 MG/2ML IJ SOLN: 4.0000 mg | Freq: Once | INTRAMUSCULAR | Status: DC | PRN

## 2019-08-20 MED ORDER — DEXAMETHASONE SODIUM PHOSPHATE 4 MG/ML IJ SOLN
INTRAMUSCULAR | Status: DC | PRN
  Administered 2019-08-20: 4 mg via INTRAVENOUS

## 2019-08-20 MED ORDER — ONDANSETRON HCL 4 MG/2ML IJ SOLN
INTRAMUSCULAR | Status: DC | PRN
  Administered 2019-08-20: 4 mg via INTRAVENOUS

## 2019-08-20 MED ORDER — SILVER NITRATE-POT NITRATE 75-25 % EX MISC
CUTANEOUS | Status: AC
  Filled 2019-08-20: qty 10

## 2019-08-20 MED ORDER — ONDANSETRON HCL 4 MG/2ML IJ SOLN: 4.0000 mg | Freq: Four times a day (QID) | INTRAMUSCULAR | Status: DC | PRN

## 2019-08-20 MED ORDER — ONDANSETRON HCL 4 MG PO TABS
4.0000 mg | ORAL_TABLET | Freq: Four times a day (QID) | ORAL | Status: DC | PRN
  Filled 2019-08-20: qty 1

## 2019-08-20 MED ORDER — OXYCODONE HCL 5 MG PO TABS
5.0000 mg | ORAL_TABLET | ORAL | Status: DC | PRN
  Administered 2019-08-20: 5 mg via ORAL

## 2019-08-20 MED ORDER — ACETAMINOPHEN 500 MG PO TABS: 1000.0000 mg | ORAL_TABLET | Freq: Four times a day (QID) | ORAL | Status: DC

## 2019-08-20 SURGICAL SUPPLY — 31 items
BACTOSHIELD CHG 4% 4OZ (MISCELLANEOUS) ×1
CATH ROBINSON RED A/P 16FR (CATHETERS) ×3 IMPLANT
COVER WAND RF STERILE (DRAPES) IMPLANT
DRAPE SHEET LG 3/4 BI-LAMINATE (DRAPES) ×5 IMPLANT
DRAPE UNDERBUTTOCKS STRL (DISPOSABLE) ×3 IMPLANT
DRSG TELFA 3X8 NADH (GAUZE/BANDAGES/DRESSINGS) ×3 IMPLANT
GAUZE 4X4 16PLY RFD (DISPOSABLE) ×5 IMPLANT
GLOVE BIO SURGEON STRL SZ 6 (GLOVE) ×6 IMPLANT
GOWN STRL REUS W/ TWL LRG LVL3 (GOWN DISPOSABLE) ×2 IMPLANT
GOWN STRL REUS W/TWL LRG LVL3 (GOWN DISPOSABLE) ×6
HOVERMATT HALF SINGLE USE (PATIENT TRANSFER) ×2 IMPLANT
KIT BASIN OR (CUSTOM PROCEDURE TRAY) ×3 IMPLANT
KIT TURNOVER KIT A (KITS) ×2 IMPLANT
Mirena IUD ×2 IMPLANT
NDL SPNL 22GX3.5 QUINCKE BK (NEEDLE) ×1 IMPLANT
NEEDLE SPNL 22GX3.5 QUINCKE BK (NEEDLE) ×3 IMPLANT
NS IRRIG 1000ML POUR BTL (IV SOLUTION) ×3 IMPLANT
PACK LITHOTOMY IV (CUSTOM PROCEDURE TRAY) ×3 IMPLANT
PAD DRESSING TELFA 3X8 NADH (GAUZE/BANDAGES/DRESSINGS) ×1 IMPLANT
PAD OB MATERNITY 4.3X12.25 (PERSONAL CARE ITEMS) ×3 IMPLANT
PENCIL SMOKE EVACUATOR (MISCELLANEOUS) IMPLANT
SCRUB CHG 4% DYNA-HEX 4OZ (MISCELLANEOUS) ×2 IMPLANT
SURGILUBE 2OZ TUBE FLIPTOP (MISCELLANEOUS) ×3 IMPLANT
SUT VIC AB 0 CT1 27 (SUTURE) ×6
SUT VIC AB 0 CT1 27XBRD ANTBC (SUTURE) IMPLANT
SYR BULB IRRIGATION 50ML (SYRINGE) ×3 IMPLANT
SYR CONTROL 10ML LL (SYRINGE) ×2 IMPLANT
TOWEL OR 17X26 10 PK STRL BLUE (TOWEL DISPOSABLE) ×3 IMPLANT
TOWEL OR NON WOVEN STRL DISP B (DISPOSABLE) ×3 IMPLANT
UNDERPAD 30X36 HEAVY ABSORB (UNDERPADS AND DIAPERS) ×4 IMPLANT
YANKAUER SUCT BULB TIP 10FT TU (MISCELLANEOUS) ×3 IMPLANT

## 2019-08-20 NOTE — Op Note (Signed)
OPERATIVE NOTE PATIENT: Danielle Stephenson DATE: 08/20/19   Preop Diagnosis: endometrial cancer grade 1  Postoperative Diagnosis: same  Surgery: D&C (dilation and curettage) with placement of progestin releasing IUD (Mirena)  Surgeons:  Donaciano Eva, MD Assistant: none  Anesthesia: General   Estimated blood loss: <63ml  IVF:  2090ml   Urine output: 123XX123 ml   Complications: None   Pathology: endometrial curetteings  Operative findings: normal appearing cervix, uterus sounded to 10cm.  Procedure: The patient was identified in the preoperative holding area. Informed consent was signed on the chart. Patient was seen history was reviewed and exam was performed.   The patient was then taken to the operating room and placed in the supine position with SCD hose on. General anesthesia was then induced without difficulty. She was then placed in the dorsolithotomy position. The perineum was prepped with Betadine. The vagina was prepped with Betadine. The patient was then draped after the prep was dried. A straight catheterization with a red rubber was inserted into the urethra to empty the bladder was performed under aseptic conditions.  Timeout was performed the patient, procedure, antibiotic, allergy, and length of procedure.   The weighted speculum was placed in the posterior vagina. The single tooth tenaculum was placed on the anterior lip of the cervix. The uterine sound was placed in the cervix and advanced to the fundus. The cervix was successively dilated using pratts dilators to 83mm. A sharp curette was advanced to the fundus and a comprehensive curette of the endometrial cavity took place until a gritty feel was appreciated. The specimen was collected on a telfa and sent for permanent pathology.  The Mirena IUD (lot #[id number], expiration April, 2023) was inserted to the uterine fundus then deployed. Strings were cut to 3inches beyond the ectocervix.  The tenaculum was  removed and hemostasis was observed.   The vagina was irrigated.  All instrument, suture, laparotomy, Ray-Tec, and needle counts were correct x2. The patient tolerated the procedure well and was taken recovery room in stable condition. This is Everitt Amber dictating an operative note on [name].  Thereasa Solo, MD

## 2019-08-20 NOTE — Discharge Instructions (Signed)
D&C, Care After ACTIVITY  Rest as much as possible the first two days after discharge.  You do not have weight restrictions on lifting  Avoid strenuous working out such as running or lifting weights for 48-72 hours  You can climb stairs and drive a car NUTRITION  You may resume your normal diet.  Drink 6 to 8 glasses of fluids a day.  Eat a healthy, balanced diet including portions of food from the meat (protein), milk, fruit, vegetable, and bread groups.  Your caregiver may recommend you take a multivitamin with iron.  ELIMINATION   If constipation occurs, drink more liquids, and add more fruits, vegetables, and bran to your diet. You may take a mild laxative, such as Milk of Magnesia, Metamucil, or a stool softener such as Colace, with permission from your caregiver.  HYGIENE  You may shower and wash your hair.  Avoid tub baths for 2 weeks  Do not add any bath oils or chemicals to your bath water, after you have permission to take baths.  Avoid placing anything in the vagina for 4 weeks.  It is normal to pass a brown-flecked discharge from the vagina for several weeks after your procedure.  HOME CARE INSTRUCTIONS   Take your temperature twice a day and record it, especially if you feel feverish or have chills.  Follow your caregiver's instructions about medicines, activity, and follow-up appointments after surgery.  Do not drink alcohol while taking pain medicine.  You may take over-the-counter medicine for pain, recommended by your caregiver.  If your pain is not relieved with medicine, call your caregiver.  Do not douche or use tampons (use a nonperfumed sanitary pad).  Do not have sexual intercourse for 2 weeks postoperatively. Hugging, kissing, and playful sexual activity is fine with your caregiver's permission.  Take showers instead of baths, until your caregiver gives you permission to take baths.  You may take a mild medicine for constipation,  recommended by your caregiver. Bran foods and drinking a lot of fluids will help with constipation.  Make sure your family understands everything about your operation and recovery.  SEEK MEDICAL CARE IF:    You notice a foul smell coming from the vagina.  You have painful or bloody urination.  You develop nausea and vomiting.  You develop diarrhea.  You develop a rash.  You have a reaction or allergy from the medicine.  You feel dizzy or light-headed.  You need stronger pain medicine.   SEEK IMMEDIATE MEDICAL CARE IF:   You develop a temperature of 102 F (38.9 C) or higher.  You pass out.  You develop leg or chest pain.  You develop abdominal pain.  You develop shortness of breath.  You bleed heavier than a period (soaking through 2 or more pads per hour for 2 hours in a row).  You see pus in the wound area.  MAKE SURE YOU:   Understand these instructions.  Will watch your condition.  Will get help right away if you are not doing well or get worse. Document Released: 02/14/2004 Document Revised: 11/16/2013 Document Reviewed: 06/03/2009 [facility] Patient Information 2015 [place], [place]. This information is not intended to replace advice given to you by your health care provider. Make sure you discuss any questions you have with your health care provider.

## 2019-08-20 NOTE — Anesthesia Postprocedure Evaluation (Signed)
Anesthesia Post Note  Patient: Danielle Stephenson  Procedure(s) Performed: DILATATION AND CURETTAGE (N/A ) INTRAUTERINE DEVICE (IUD) INSERTION - MIRENA (N/A )     Patient location during evaluation: PACU Anesthesia Type: General Level of consciousness: awake and alert Pain management: pain level controlled Vital Signs Assessment: post-procedure vital signs reviewed and stable Respiratory status: spontaneous breathing, nonlabored ventilation and respiratory function stable Cardiovascular status: blood pressure returned to baseline and stable Postop Assessment: no apparent nausea or vomiting Anesthetic complications: no    Last Vitals:  Vitals:   08/20/19 0945 08/20/19 1015  BP:  120/70  Pulse:  68  Resp: 12 12  Temp: 37 C   SpO2:  93%    Last Pain:  Vitals:   08/20/19 1015  TempSrc:   PainSc: 2                  Catalina Gravel
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
## 2019-08-20 NOTE — Transfer of Care (Signed)
Immediate Anesthesia Transfer of Care Note  Patient: Danielle Stephenson  Procedure(s) Performed: Procedure(s): DILATATION AND CURETTAGE (N/A) INTRAUTERINE DEVICE (IUD) INSERTION - MIRENA (N/A)  Patient Location: PACU  Anesthesia Type:General  Level of Consciousness:  sedated, patient cooperative and responds to stimulation  Airway & Oxygen Therapy:Patient Spontanous Breathing and Patient connected to face mask oxgen  Post-op Assessment:  Report given to PACU RN and Post -op Vital signs reviewed and stable  Post vital signs:  Reviewed and stable  Last Vitals:  Vitals:   08/20/19 0619  BP: (!) 137/57  Pulse: 67  Resp: 20  Temp: 37 C  SpO2: [id number]    Complications: No apparent anesthesia complications

## 2019-08-20 NOTE — Anesthesia Procedure Notes (Signed)
Procedure Name: Intubation Date/Time: 08/20/2019 7:39 AM Performed by: Lavina Hamman, CRNA Pre-anesthesia Checklist: Patient identified, Emergency Drugs available, Suction available and Patient being monitored Patient Re-evaluated:Patient Re-evaluated prior to induction Oxygen Delivery Method: Circle system utilized Preoxygenation: Pre-oxygenation with 100% oxygen Induction Type: IV induction Laryngoscope Size: 3 and Mac Grade View: Grade II Tube size: 7.0 mm Number of attempts: 1 Airway Equipment and Method: Stylet Placement Confirmation: ETT inserted through vocal cords under direct vision,  positive ETCO2,  CO2 detector and breath sounds checked- equal and bilateral Secured at: 22 cm Tube secured with: Tape

## 2019-08-21 ENCOUNTER — Telehealth: Payer: Self-pay

## 2019-08-21 ENCOUNTER — Encounter: Payer: Self-pay | Admitting: *Deleted

## 2019-08-21 DIAGNOSIS — R300 Dysuria: Secondary | ICD-10-CM

## 2019-08-21 DIAGNOSIS — R930 Abnormal findings on diagnostic imaging of skull and head, not elsewhere classified: Secondary | ICD-10-CM

## 2019-08-21 LAB — SURGICAL PATHOLOGY

## 2019-08-21 MED ORDER — NITROFURANTOIN MONOHYD MACRO 100 MG PO CAPS: 100.0000 mg | ORAL_CAPSULE | Freq: Two times a day (BID) | ORAL | 0 refills | Status: DC

## 2019-08-21 MED ORDER — PHENAZOPYRIDINE HCL 200 MG PO TABS: 200.0000 mg | ORAL_TABLET | Freq: Three times a day (TID) | ORAL | 0 refills | Status: DC | PRN

## 2019-08-21 NOTE — Telephone Encounter (Signed)
Spoke with Danielle Stephenson. She is eating, drinking and urinating well. She is experiencing burning, frequency, and pain with urination since yesterday. Afebrile. Reviewed with Dr. Denman George. Dr. Denman George is empirically treating her with Macrobid 100 mg po bid x 7 days. Sent in pyridium 100 mg tid prn # 10 tablets  for discomfort. Pt passing gas. She knows office number [phone] to call if she has any questions or concerns.

## 2019-08-24 ENCOUNTER — Telehealth: Payer: Self-pay | Admitting: *Deleted

## 2019-08-24 NOTE — Telephone Encounter (Signed)
Patient informed of pathology results from 08/20/19 - endometrioid carcinoma.  Patient verbalizes understanding.

## 2019-10-19 ENCOUNTER — Ambulatory Visit: Payer: Medicare Other | Admitting: Gynecologic Oncology

## 2019-11-20 ENCOUNTER — Ambulatory Visit: Payer: Medicare Other | Admitting: Gynecologic Oncology

## 2019-11-20 ENCOUNTER — Telehealth: Payer: Self-pay | Admitting: *Deleted

## 2019-11-20 ENCOUNTER — Inpatient Hospital Stay: Payer: Medicare Other | Admitting: Gynecologic Oncology

## 2019-11-20 NOTE — Telephone Encounter (Signed)
Patient called and moved her appt from today to 5/12

## 2019-11-25 ENCOUNTER — Inpatient Hospital Stay: Payer: Medicare Other | Attending: Gynecologic Oncology | Admitting: Gynecologic Oncology

## 2019-11-25 ENCOUNTER — Other Ambulatory Visit: Payer: Self-pay

## 2019-11-25 ENCOUNTER — Encounter: Payer: Self-pay | Admitting: Gynecologic Oncology

## 2019-11-25 VITALS — BP 147/91 | HR 87 | Temp 98.0°F | Resp 17 | Ht 66.0 in | Wt 357.1 lb

## 2019-11-25 DIAGNOSIS — E119 Type 2 diabetes mellitus without complications: Secondary | ICD-10-CM | POA: Insufficient documentation

## 2019-11-25 DIAGNOSIS — C541 Malignant neoplasm of endometrium: Secondary | ICD-10-CM | POA: Diagnosis not present

## 2019-11-25 DIAGNOSIS — J45909 Unspecified asthma, uncomplicated: Secondary | ICD-10-CM | POA: Insufficient documentation

## 2019-11-25 DIAGNOSIS — Z6841 Body Mass Index (BMI) 40.0 and over, adult: Secondary | ICD-10-CM | POA: Insufficient documentation

## 2019-11-25 NOTE — Patient Instructions (Signed)
Dr Denman George performed a uterine biopsy today. If this shows persistent cancer, she recommends continuing with the IUD for another 3 months then repeat sampling at your next visit.  If this shows no residual cancer is left, she will see you again in 3 months for a check up but will not need to repeat the biopsy at that time unless there is new bleeding.  Her office can be reached at [phone].
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
## 2019-11-27 LAB — SURGICAL PATHOLOGY

## 2019-11-30 ENCOUNTER — Telehealth: Payer: Self-pay

## 2019-11-30 NOTE — Telephone Encounter (Signed)
Told Ms Condrey that the pathology showed no cancer or pre-cancer. The IUD is working on the endometrium per [name], NP. She is to follow up in 3 months as scheduled. She will not need a repeat biopsy at that time unless there is new bleeding. Pt very pleased with results.

## 2020-02-25 ENCOUNTER — Telehealth: Payer: Self-pay | Admitting: *Deleted

## 2020-02-25 ENCOUNTER — Inpatient Hospital Stay: Payer: Medicare Other | Admitting: Gynecologic Oncology

## 2020-02-25 NOTE — Telephone Encounter (Signed)
Patient called and rescheduled her appt from today to next week. Patient not feeling well

## 2020-03-02 ENCOUNTER — Inpatient Hospital Stay: Payer: Medicare Other | Attending: Gynecologic Oncology | Admitting: Gynecologic Oncology

## 2020-03-02 ENCOUNTER — Encounter: Payer: Self-pay | Admitting: Gynecologic Oncology

## 2020-03-02 ENCOUNTER — Inpatient Hospital Stay: Payer: Medicare Other

## 2020-03-02 ENCOUNTER — Other Ambulatory Visit: Payer: Self-pay

## 2020-03-02 VITALS — BP 132/63 | HR 82 | Temp 98.3°F | Resp 18 | Ht 66.0 in | Wt 357.6 lb

## 2020-03-02 DIAGNOSIS — R300 Dysuria: Secondary | ICD-10-CM

## 2020-03-02 DIAGNOSIS — J45909 Unspecified asthma, uncomplicated: Secondary | ICD-10-CM | POA: Insufficient documentation

## 2020-03-02 DIAGNOSIS — E119 Type 2 diabetes mellitus without complications: Secondary | ICD-10-CM | POA: Diagnosis not present

## 2020-03-02 DIAGNOSIS — Z6841 Body Mass Index (BMI) 40.0 and over, adult: Secondary | ICD-10-CM | POA: Insufficient documentation

## 2020-03-02 DIAGNOSIS — Z9884 Bariatric surgery status: Secondary | ICD-10-CM | POA: Insufficient documentation

## 2020-03-02 DIAGNOSIS — C541 Malignant neoplasm of endometrium: Secondary | ICD-10-CM | POA: Insufficient documentation

## 2020-03-02 DIAGNOSIS — Z79899 Other long term (current) drug therapy: Secondary | ICD-10-CM | POA: Diagnosis not present

## 2020-03-02 DIAGNOSIS — F329 Major depressive disorder, single episode, unspecified: Secondary | ICD-10-CM | POA: Diagnosis not present

## 2020-03-02 MED ORDER — NITROFURANTOIN MONOHYD MACRO 100 MG PO CAPS: 100.0000 mg | ORAL_CAPSULE | Freq: Two times a day (BID) | ORAL | 0 refills | Status: DC

## 2020-03-02 NOTE — Patient Instructions (Signed)
Please notify Dr Denman George at phone number [phone] if you notice vaginal bleeding, new pelvic or abdominal pains, bloating, feeling full easy, or a change in bladder or bowel function.   Please contact Dr Serita Grit office (at [phone]) in October to request an appointment with her for February, 2022.  Dr Denman George has prescribed macrobid for 1 week for your UTI. Her office will call you with today's culture results.
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
## 2020-03-03 LAB — URINE CULTURE: Culture: <10000 — AB

## 2020-03-17 ENCOUNTER — Telehealth: Payer: Self-pay | Admitting: Neurology

## 2020-03-17 NOTE — Telephone Encounter (Signed)
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 I advised her regarding our rule for no children during visit and she stated she wanted a call back from the nurse regarding this. Best call back is [phone]

## 2020-03-17 NOTE — Telephone Encounter (Signed)
Called the patient back and discussed converting the visit to a mychart video visit for her upcoming sleep consult. Patient was ok with doing that. At this time she is not currently set up on the [facility] mychart. I texted the link to her to make sure she is eligible to sign up and converted her visit to mychart. Informed the patient to send me a message once she is active so that I may send her the epworth to complete prior to her visit. I reviewed the patient medication list with her and updated on our end. Patient was appreciative for this.

## 2020-03-22 ENCOUNTER — Encounter: Payer: Self-pay | Admitting: Neurology

## 2020-03-23 ENCOUNTER — Encounter: Payer: Self-pay | Admitting: Neurology

## 2020-03-23 ENCOUNTER — Telehealth (INDEPENDENT_AMBULATORY_CARE_PROVIDER_SITE_OTHER): Payer: Medicare Other | Admitting: Neurology

## 2020-03-23 DIAGNOSIS — E669 Obesity, unspecified: Secondary | ICD-10-CM

## 2020-03-23 DIAGNOSIS — E662 Morbid (severe) obesity with alveolar hypoventilation: Secondary | ICD-10-CM | POA: Diagnosis not present

## 2020-03-23 DIAGNOSIS — G4733 Obstructive sleep apnea (adult) (pediatric): Secondary | ICD-10-CM | POA: Diagnosis not present

## 2020-03-23 DIAGNOSIS — Z6841 Body Mass Index (BMI) 40.0 and over, adult: Secondary | ICD-10-CM

## 2020-03-23 NOTE — Progress Notes (Addendum)
Virtual Visit via Video Note  I connected with Danielle Stephenson on 03/23/20 at  3:00 PM EDT by a video enabled telemedicine application and verified that I am speaking with the correct person using two identifiers.  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 The patient expressed understanding and agreed to proceed.  History of Present Illness: longstanding CPAP user.    0 0 1 1 0 0 0 Total 2 Circumference - 18 1/2 inches Danielle Crocker, RN to [place], [place]     03/22/20 4:21 PM Please complete the sleepiness scale below prior to appointment tomorrow. Remember to check in online around 2:30 pm. She may be running behind but don't log out because it may cause a disruption. If you have the capability of measuring your neck circumference that information will also be helpful.   Epworth Sleepiness Scale 0= would never doze 1= slight chance of dozing 2= moderate chance of dozing 3= high chance of dozing     Observations/Objective: Alert and oriented, well groomed, obese.  I have the pleasure of seeing Danielle Stephenson today she is an established sleep apnea patient in our clinic and was referred today by ENT specialist Dr. Melida Quitter he was aware that I had seen the patient in the past and that I have treated her with CPAP for that condition of obstructive sleep apnea the patient has been 100% compliant patient for 30 days and by time with an average use at time of 9 hours 54 minutes, set pressure of 10 cmH2O with 2 cm expiratory pressure relief.  She is using an Surveyor, minerals but is now 74 years old and needs urgent replacement.  Serial number is [id number].  Residual AHI at the current setting is 2.5/h which speaks for good resolution there is still some residual apneas and her air leak is very high.  The patient had seen Dr. Redmond Baseman for  evaluation of an inspire procedure but unfortunately was disqualified due to her high BMI.  The patient also has a nasal septal perforation for which Dr. Redmond Baseman has treated her.  She continues to get good benefit from CPAP but her machine seems not longer to work correctly.  Her current BMI is 67.08.  Her baseline study was an adult split-night polysomnography in June 2014 at the time referred by Dr. Nicholes Stairs, MD.  BMI was 60.9 at the time AHI was 66.6/h there were 58 minutes of oxygen desaturation with a nadir at 75% oxygen saturation..  MR arousal index was 2.8.  She did not have cardiac arrhythmias but she clearly had REM dependent and hypoxic manifestations to her sleep apnea making this a more complex case.  She was titrated to 10 cmH2O.  She is also seeing Vaughan Browner nurse practitioner here in our in our office in July 2016 at the time of his headaches.  The patient agreed to be referred for a sleep study baseline and refitting of a nasal mask or nasal pillows or nasal cradle interface in the near future.    Assessment and Plan: needs new CPAP due to technical support on 72 year old machine running out. Not a candidate for Inspire due to high BMI.    Follow Up Instructions: CPAP re-titration with 1 hour baseline patient wants to try a new mask, either wisp or bella swift.   Technologist  will be asked to fit her !     I discussed the assessment and treatment plan with the patient. The patient was provided an opportunity to ask questions and all were answered. The patient agreed with the plan and demonstrated an understanding of the instructions.   The patient was advised to call back or seek an in-person evaluation if the symptoms worsen or if the condition fails to improve as anticipated.  I provided 20 minutes of non-face-to-face time during this encounter.   Larey Seat, MD

## 2020-03-31 ENCOUNTER — Other Ambulatory Visit: Payer: Self-pay

## 2020-03-31 ENCOUNTER — Ambulatory Visit (INDEPENDENT_AMBULATORY_CARE_PROVIDER_SITE_OTHER): Payer: Medicare Other | Admitting: Neurology

## 2020-03-31 DIAGNOSIS — G4734 Idiopathic sleep related nonobstructive alveolar hypoventilation: Secondary | ICD-10-CM

## 2020-03-31 DIAGNOSIS — E662 Morbid (severe) obesity with alveolar hypoventilation: Secondary | ICD-10-CM

## 2020-03-31 DIAGNOSIS — E669 Obesity, unspecified: Secondary | ICD-10-CM

## 2020-03-31 DIAGNOSIS — G4733 Obstructive sleep apnea (adult) (pediatric): Secondary | ICD-10-CM

## 2020-04-13 ENCOUNTER — Telehealth: Payer: Self-pay | Admitting: Neurology

## 2020-04-13 DIAGNOSIS — E669 Obesity, unspecified: Secondary | ICD-10-CM | POA: Insufficient documentation

## 2020-04-13 DIAGNOSIS — G4733 Obstructive sleep apnea (adult) (pediatric): Secondary | ICD-10-CM | POA: Insufficient documentation

## 2020-04-13 DIAGNOSIS — Z6841 Body Mass Index (BMI) 40.0 and over, adult: Secondary | ICD-10-CM | POA: Insufficient documentation

## 2020-04-13 NOTE — Telephone Encounter (Signed)
Pt called and LVM stating that she is wanting to know if her Sleep Study results have come in. Please advise.

## 2020-04-13 NOTE — Addendum Note (Signed)
Addended by: Larey Seat on: 04/13/2020 05:21 PM   Modules accepted: Orders

## 2020-04-13 NOTE — Procedures (Signed)
PATIENT'S NAME:  Danielle Stephenson, Danielle Stephenson DOB:      September 26, 1945      MR#:    [id number]     DATE OF RECORDING: 03/31/2020 REFERRING M.D.:  Dr Melida Quitter, MD ENT Study Performed:  Split-Night Titration Study HISTORY: virtual visit from 03-23-2020,  I have the pleasure of seeing [name] today she is an established sleep apnea patient in our clinic and was referred by ENT specialist Dr. Melida Quitter; he was aware that I had seen the patient in the past and that I have treated her with CPAP for obstructive sleep apnea. Marcelline Deist patient has been 100% compliant patient for 30 days and by time with an average use at time of 9 hours 54 minutes, set pressure of 10 cmH2O with 2 cm expiratory pressure relief.  She is using an Surveyor, minerals but is now 74 years old and needs urgent replacement.  Serial number is [id number].  Residual AHI at the current setting is 2.5/h which speaks for good resolution there is still some residual apneas and her air leak is very high.   The patient had seen Dr. Redmond Baseman for evaluation of an inspire procedure ,but unfortunately was disqualified due to her high BMI.  The patient also has a nasal septal perforation for which Dr. Redmond Baseman has treated her.  She continues to get good benefit from CPAP but her machine seems not longer to work correctly.  Her current BMI is 67.08.   Her baseline study was an adult split-night polysomnography in June 2014 at the time referred by Dr. Nicholes Stairs, MD. BMI was 60.9 at the time AHI was 66.6/h there were 58 minutes of oxygen desaturation with a nadir at 75% oxygen saturation,  arousal index was 2.8. She did not have cardiac arrhythmias but she clearly had REM dependent and hypoxic manifestations to her sleep apnea making this a more complex case.  She was titrated to 10 cmH2O.  The patient agreed to be referred for a sleep study baseline and refitting of a nasal mask or nasal pillows or nasal cradle interface in the near future.  The patient endorsed the  Epworth Sleepiness Scale at 2 points   The patient's weight 357 pounds with a height of 66 (inches), resulting in a BMI of 57.4 kg/m2. The patient's neck circumference measured 18.5 inches.  CURRENT MEDICATIONS: Ventolin, Norvasc, Coreg, Vit D, Prozac, Advair, Cozaar, Hydrodiuril, Lopressor, Singulair, Crestor, Trulicity   PROCEDURE:  This is a multichannel digital polysomnogram utilizing the Somnostar 11.2 system.  Electrodes and sensors were applied and monitored per AASM Specifications.   EEG, EOG, Chin and Limb EMG, were sampled at 200 Hz.  ECG, Snore and Nasal Pressure, Thermal Airflow, Respiratory Effort, CPAP Flow and Pressure, Oximetry was sampled at 50 Hz. Digital video and audio were recorded.      BASELINE STUDY WITHOUT CPAP RESULTS: Lights Out was at 21:59 and Lights On at 04:01.  Total recording time (TRT) was 77.5, with a total sleep time (TST) of 63.5 minutes.   The patient's sleep latency was 5 minutes.  REM latency was 0 minutes.  The sleep efficiency was 81.9 %.    SLEEP ARCHITECTURE: WASO (Wake after sleep onset) was 1.5 minutes, Stage N1 was 35 minutes, Stage N2 was 28.5 minutes, Stage N3 was 0 minutes and Stage R (REM sleep) was 0 minutes.  The percentages were Stage N1 55.1%, Stage N2 44.9%, Stage N3 0% and Stage R (REM sleep) 0%.  RESPIRATORY ANALYSIS:  There were a total of 66 respiratory events:  0 obstructive apneas, 0 central apneas and 0 mixed apneas with a total of 0 apneas and an apnea index (AI) of 0.  There were 66 hypopneas with a hypopnea index of 62.4.  The total APNEA/HYPOPNEA INDEX (AHI) was 62.4 /hour =  132 events in NREM.  OXYGEN SATURATION & C02:  The wake baseline 02 saturation was 91%, with the lowest being 80%. Time spent below 89% saturation equaled 54 minutes. The arousals were noted as: 13 were spontaneous, 0 were associated with PLMs, 28 were associated with respiratory events. EKG with frequent PVCs. See screen shot attached.   TITRATION STUDY  WITH CPAP RESULTS:   CPAP was initiated at 8 cmH20 with heated humidity per AASM split night standards and pressure was advanced to 14 cmH20 because of hypopneas, apneas and 02 desaturations. The final explored CPAP pressure did not control the AHI and the technologist changed to BiPAP, beginning at a setting of 14/10 and ending at 18/13 cm water the patient slept for 35 minutes with AHI of 0.0/h at last pressure. ResMed p10 nasal pillows were used upon patient's request.  The only problem arose in supine sleep when apneas broke through and oral venting started - she preferred a nasal mask, but we may have to switch to a F30i FFM or needs a chin strap.    Total recording time (TRT) was 285.5 minutes, with a total sleep time (TST) of 264.5 minutes. The patient's sleep latency was 4 minutes. REM latency was 102.5 minutes.  The sleep efficiency was 92.6 %.    SLEEP ARCHITECTURE: Wake after sleep was 17 minutes, Stage N1 23 minutes, Stage N2 141 minutes, Stage N3 23 minutes and Stage R (REM sleep) 77.5 minutes. The percentages were: Stage N1 8.7%, Stage N2 53.3%, Stage N3 8.7% and Stage R (REM sleep) 29.3%. REM rebound was noted.   RESPIRATORY ANALYSIS:  There were a total of 78 respiratory events: 78 hypopneas with a hypopnea index of 17.7 /hour. The total APNEA/HYPOPNEA INDEX (AHI) was 17.7 /hour and the total RESPIRATORY DISTURBANCE INDEX was 17.7 /hour.  2 events occurred in REM sleep and 76 events in NREM. The REM AHI was 1.5 /hour versus a non-REM AHI of 24.4 /hour. The patient spent 25% of total sleep time in the supine position. The non-supine AHI was 8.3 /hour, versus a supine AHI of 20.8/hour.  OXYGEN SATURATION & C02:  The wake baseline 02 saturation was 94%, with the lowest being 86%. Time spent below 89% saturation equaled 22 minutes. The arousals were noted as: 11 were spontaneous, 0 were associated with PLMs, 19 were associated with respiratory events. The patient had a total of 0 Periodic  Limb Movements  Post-study, the patient indicated that sleep was more restful than usual.     POLYSOMNOGRAPHY IMPRESSION :   1. Sleep hypoxemia in the setting of severe Obstructive Sleep Apnea (OSA) at AHI of 62.4/h and total desaturation time of over 60 minutes.  2. abnormal EKG with many, many PVCs.  3. Response of sleep apnea to BiPAP treatment after failing CPAP. Required pressure was 18/13 cm water.  4. Apnea and hypoxemia were alleviated at that BiPAP pressure, as long as the patient avoided supine sleep. This observation was based on the use of a nasal pillow mask, which did not prevent oral venting in supine sleep- I recommend adding a chinstrap or changing to F30I ResMed mask, a FFM.  5. RECOMMENDATIONS: Apnea and hypoxemia were alleviated at 18/13 BiPAP pressure as long as the patient avoided supine sleep. This was based on the use of a nasal pillow mask , which did not prevent oral venting in supine sleep- I recommend adding a chinstrap or changing to F30I ResMed mask, a FFM.      A follow up appointment will be scheduled in the Sleep Clinic at [facility].      I certify that I have reviewed the entire raw data recording prior to the issuance of this report in accordance with the Standards of Accreditation of the American Academy of Sleep Medicine (AASM)    Larey Seat, M.D. Diplomat, Tax adviser of Psychiatry and Neurology  Diplomat, Tax adviser of Sleep Medicine Market researcher, [employer] [facility] at [employer]

## 2020-04-14 ENCOUNTER — Telehealth: Payer: Self-pay | Admitting: Neurology

## 2020-04-14 NOTE — Telephone Encounter (Signed)
I called pt. I advised pt that Dr. Brett Fairy reviewed their sleep study results and found that pt sleep apnea and was best treated with a BiPAP. Dr. Brett Fairy recommends that pt starts BiPAP 18/13 cm water pressure. I reviewed PAP compliance expectations with the pt. Pt is agreeable to starting a CPAP. I advised pt that an order will be sent to a DME, [facility] and [facility] will call the pt within about one week after they file with the pt's insurance. [facility]) will show the pt how to use the machine, fit for masks, and troubleshoot the CPAP if needed. A mychart video visit  follow up appt was made for insurance purposes with Ward Givens on Jan 6,2021 at 10:30 am. Pt verbalized understanding to arrive 15 minutes early and bring their CPAP. A letter with all of this information in it will be mailed to the pt as a reminder. I verified with the pt that the address we have on file is correct. Pt verbalized understanding of results. Pt had no questions at this time but was encouraged to call back if questions arise. I have sent the order to [facility]) and have received confirmation that they have received the order.

## 2020-04-14 NOTE — Telephone Encounter (Signed)
-----   Message from Larey Seat, MD sent at 04/13/2020  5:21 PM EDT ----- POLYSOMNOGRAPHY IMPRESSION :   1. Sleep hypoxemia in the setting of severe Obstructive Sleep  Apnea (OSA) at AHI of 62.4/h and total desaturation time of over  60 minutes.  2. abnormal EKG with many, many PVCs.  3. Response of sleep apnea to BiPAP treatment after failing CPAP.  Required pressure was 18/13 cm water.  4. Apnea and hypoxemia were alleviated at that BiPAP pressure, as  long as the patient avoided supine sleep. This observation was  based on the use of a nasal pillow mask, which did not prevent  oral venting in supine sleep- I recommend adding a chinstrap or  changing to F30I ResMed mask, a FFM.     RECOMMENDATIONS: Apnea and hypoxemia were alleviated at 18/13  BiPAP pressure as long as the patient avoided supine sleep. This  was based on the use of a nasal pillow mask , which did not  prevent oral venting in supine sleep- I recommend adding a  chinstrap or changing to F30I ResMed mask, a FFM.

## 2020-05-02 NOTE — Telephone Encounter (Signed)
I have forwarded the message to [facility]. They will contact pt

## 2020-05-02 NOTE — Telephone Encounter (Signed)
Pt has called to inform [place].RN that she has yet to hear from [facility].  Pt states she will call them but did want Myriam Jacobson, RN to be aware.

## 2020-05-11 ENCOUNTER — Other Ambulatory Visit: Payer: Self-pay | Admitting: Nurse Practitioner

## 2020-05-11 DIAGNOSIS — Z1231 Encounter for screening mammogram for malignant neoplasm of breast: Secondary | ICD-10-CM

## 2020-05-12 ENCOUNTER — Telehealth: Payer: Self-pay | Admitting: *Deleted

## 2020-05-12 NOTE — Telephone Encounter (Signed)
Patient called to schedule an appt in February; explained the schedule is not out yet and to call back the end of November

## 2020-07-21 ENCOUNTER — Telehealth (INDEPENDENT_AMBULATORY_CARE_PROVIDER_SITE_OTHER): Payer: Medicare Other | Admitting: Adult Health

## 2020-07-21 DIAGNOSIS — G4733 Obstructive sleep apnea (adult) (pediatric): Secondary | ICD-10-CM | POA: Diagnosis not present

## 2020-07-21 NOTE — Progress Notes (Signed)
[facility] [address] [place]. [place] [address] [phone]     Virtual Visit via Telephone Note  I connected with Danielle Stephenson on 07/21/20 at 10:30 AM EST by telephone located remotely at [facility] and verified that I am speaking with the correct person using two identifiers who reports being located at home.   Visit scheduled by [facility] staff. She discussed the limitations, risks, security and privacy concerns of performing an evaluation and management service by telephone and the availability of in person appointments. I also discussed with the patient that there may be a patient responsible charge related to this service. The patient expressed understanding and agreed to proceed. See telephone note for consent and additional scheduling information.    History of Present Illness:  Danielle Stephenson is a 75 y.o. female who has been followed in this office for obstructive sleep apnea on BiPAP.  She was scheduled for a video visit but her camera would not work correctly.  She was transitioned to a telephone visit.  Her download  indicates that she use her machine nightly for compliance of 100%.  She used her machine greater than 4 hours each night.  On average she uses her machine 9 hours and 12 minutes.  Her residual AHI is 1.1 on 18/13 centimeters of water she does not have a significant leak.  Reports that the BiPAP works well for her.    HISTORY (Copied from Dr.Dohmeier's note)  have the pleasure of seeing Danielle Stephenson today she is an established sleep apnea patient in our clinic and was referred today by ENT specialist Dr. Melida Quitter he was aware that I had seen the patient in the past and that I have treated her with CPAP for that condition of obstructive sleep apnea the patient has been 100% compliant patient for 30 days and by time with an average use at time of 9 hours 54 minutes, set pressure of 10 cmH2O with 2 cm expiratory pressure  relief.  She is using an Surveyor, minerals but is now 75 years old and needs urgent replacement.  Serial number is [id number].  Residual AHI at the current setting is 2.5/h which speaks for good resolution there is still some residual apneas and her air leak is very high.  The patient had seen Dr. Redmond Baseman for evaluation of an inspire procedure but unfortunately was disqualified due to her high BMI.  The patient also has a nasal septal perforation for which Dr. Redmond Baseman has treated her.  She continues to get good benefit from CPAP but her machine seems not longer to work correctly.  Her current BMI is 67.08.  Her baseline study was an adult split-night polysomnography in June 2014 at the time referred by Dr. Nicholes Stairs, MD.  BMI was 60.9 at the time AHI was 66.6/h there were 58 minutes of oxygen desaturation with a nadir at 75% oxygen saturation..  MR arousal index was 2.8.  She did not have cardiac arrhythmias but she clearly had REM dependent and hypoxic manifestations to her sleep apnea making this a more complex case.  She was titrated to 10 cmH2O.  She is also seeing Vaughan Browner nurse practitioner here in our in our office in July 2016 at the time of his headaches.  The patient agreed to be referred for a sleep study baseline and refitting of a nasal mask or nasal pillows or nasal cradle interface in the near future.    Observations/Objective:  Generalized: Well developed, in no acute distress   Neurological examination  Mentation: Alert oriented to time, place, history taking. Follows all commands speech and language fluent  Assessment and Plan:  1: OSA on BiPAP  --Good compliance --Good treatment of apnea --Encourage patient to use CPAP nightly and greater than 4 hours each night    Follow Up Instructions:   F/U in 1 year   I discussed the assessment and treatment plan with the patient.  The patient was provided an opportunity to ask questions and all were answered to their  satisfaction. The patient agreed with the plan and verbalized an understanding of the instructions.   I spent 10 minutes of face-to-face and non-face-to-face time with patient.  This included previsit chart review, lab review, study review, order entry, electronic health record documentation, patient education.     Butch Penny NP-C  [facility] [address] [place], [place] [address]  Phone [phone] Fax [phone]

## 2020-09-02 ENCOUNTER — Telehealth: Payer: Self-pay

## 2020-09-02 NOTE — Telephone Encounter (Signed)
TC to patient to review meaningful use.  No answer, left message to return call.

## 2020-09-05 ENCOUNTER — Inpatient Hospital Stay: Payer: Medicare Other | Admitting: Gynecologic Oncology

## 2020-09-05 ENCOUNTER — Telehealth: Payer: Self-pay | Admitting: *Deleted

## 2020-09-05 DIAGNOSIS — C541 Malignant neoplasm of endometrium: Secondary | ICD-10-CM

## 2020-09-05 NOTE — Telephone Encounter (Signed)
Patient called and rescheduled her appt from today to March 8th

## 2020-09-19 ENCOUNTER — Encounter: Payer: Self-pay | Admitting: Gynecologic Oncology

## 2020-09-20 ENCOUNTER — Encounter: Payer: Self-pay | Admitting: Gynecologic Oncology

## 2020-09-20 ENCOUNTER — Inpatient Hospital Stay: Payer: Medicare Other | Attending: Gynecologic Oncology | Admitting: Gynecologic Oncology

## 2020-09-20 ENCOUNTER — Other Ambulatory Visit: Payer: Self-pay

## 2020-09-20 VITALS — BP 153/77 | HR 88 | Temp 97.9°F | Resp 17 | Ht 66.0 in | Wt 351.0 lb

## 2020-09-20 DIAGNOSIS — Z6841 Body Mass Index (BMI) 40.0 and over, adult: Secondary | ICD-10-CM | POA: Diagnosis not present

## 2020-09-20 DIAGNOSIS — G4733 Obstructive sleep apnea (adult) (pediatric): Secondary | ICD-10-CM | POA: Diagnosis not present

## 2020-09-20 DIAGNOSIS — E785 Hyperlipidemia, unspecified: Secondary | ICD-10-CM | POA: Diagnosis not present

## 2020-09-20 DIAGNOSIS — I10 Essential (primary) hypertension: Secondary | ICD-10-CM | POA: Diagnosis not present

## 2020-09-20 DIAGNOSIS — J45909 Unspecified asthma, uncomplicated: Secondary | ICD-10-CM | POA: Diagnosis not present

## 2020-09-20 DIAGNOSIS — Z79899 Other long term (current) drug therapy: Secondary | ICD-10-CM | POA: Insufficient documentation

## 2020-09-20 DIAGNOSIS — Z8542 Personal history of malignant neoplasm of other parts of uterus: Secondary | ICD-10-CM | POA: Diagnosis not present

## 2020-09-20 DIAGNOSIS — E119 Type 2 diabetes mellitus without complications: Secondary | ICD-10-CM | POA: Insufficient documentation

## 2020-09-20 DIAGNOSIS — C541 Malignant neoplasm of endometrium: Secondary | ICD-10-CM

## 2020-09-20 DIAGNOSIS — F32A Depression, unspecified: Secondary | ICD-10-CM | POA: Diagnosis not present
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
## 2020-09-20 NOTE — Patient Instructions (Signed)
Dr Serita Grit office will call you with the results of today's biopsy. If these show cancer, she will contact you and schedule an MRI to look at the size of the cancer. If this shows no cancer, she recommends that you return in 6 months for follow-up. Please contact Dr Serita Grit office (at [phone]) in June to request an appointment with her for September, 2022.

## 2020-09-21 ENCOUNTER — Telehealth: Payer: Self-pay

## 2020-09-21 LAB — SURGICAL PATHOLOGY

## 2020-09-21 NOTE — Telephone Encounter (Signed)
Negative endometrial biopsy results reported to patient.  Patient verbalized understanding and is appreciative of call.

## 2020-10-02 ENCOUNTER — Other Ambulatory Visit: Payer: Self-pay

## 2020-10-02 ENCOUNTER — Emergency Department (HOSPITAL_COMMUNITY)
Admission: EM | Admit: 2020-10-02 | Discharge: 2020-10-02 | Disposition: A | Discharge status: Home / Self Care | Payer: Medicare Other | Attending: Emergency Medicine | Admitting: Emergency Medicine

## 2020-10-02 ENCOUNTER — Emergency Department (HOSPITAL_COMMUNITY): Payer: Medicare Other

## 2020-10-02 DIAGNOSIS — Z8542 Personal history of malignant neoplasm of other parts of uterus: Secondary | ICD-10-CM | POA: Insufficient documentation

## 2020-10-02 DIAGNOSIS — J45909 Unspecified asthma, uncomplicated: Secondary | ICD-10-CM | POA: Insufficient documentation

## 2020-10-02 DIAGNOSIS — Z7984 Long term (current) use of oral hypoglycemic drugs: Secondary | ICD-10-CM | POA: Insufficient documentation

## 2020-10-02 DIAGNOSIS — Z96611 Presence of right artificial shoulder joint: Secondary | ICD-10-CM | POA: Diagnosis not present

## 2020-10-02 DIAGNOSIS — Z7951 Long term (current) use of inhaled steroids: Secondary | ICD-10-CM | POA: Insufficient documentation

## 2020-10-02 DIAGNOSIS — R509 Fever, unspecified: Secondary | ICD-10-CM | POA: Diagnosis present

## 2020-10-02 DIAGNOSIS — R5383 Other fatigue: Secondary | ICD-10-CM

## 2020-10-02 DIAGNOSIS — Z96652 Presence of left artificial knee joint: Secondary | ICD-10-CM | POA: Diagnosis not present

## 2020-10-02 DIAGNOSIS — Z20822 Contact with and (suspected) exposure to covid-19: Secondary | ICD-10-CM | POA: Insufficient documentation

## 2020-10-02 DIAGNOSIS — J020 Streptococcal pharyngitis: Secondary | ICD-10-CM | POA: Diagnosis not present

## 2020-10-02 DIAGNOSIS — E119 Type 2 diabetes mellitus without complications: Secondary | ICD-10-CM | POA: Diagnosis not present

## 2020-10-02 DIAGNOSIS — I10 Essential (primary) hypertension: Secondary | ICD-10-CM | POA: Diagnosis not present

## 2020-10-02 DIAGNOSIS — K7460 Unspecified cirrhosis of liver: Secondary | ICD-10-CM | POA: Diagnosis not present

## 2020-10-02 DIAGNOSIS — K5792 Diverticulitis of intestine, part unspecified, without perforation or abscess without bleeding: Secondary | ICD-10-CM | POA: Insufficient documentation

## 2020-10-02 DIAGNOSIS — Z79899 Other long term (current) drug therapy: Secondary | ICD-10-CM | POA: Insufficient documentation

## 2020-10-02 LAB — COMPREHENSIVE METABOLIC PANEL
ALT: 15 U/L (ref 0–44)
AST: 15 U/L (ref 15–41)
Albumin: 3.3 g/dL — ABNORMAL LOW (ref 3.5–5.0)
Alkaline Phosphatase: 56 U/L (ref 38–126)
Anion gap: 12 (ref 5–15)
BUN: 14 mg/dL (ref 8–23)
CO2: 24 mmol/L (ref 22–32)
Calcium: 8.9 mg/dL (ref 8.9–10.3)
Chloride: 99 mmol/L (ref 98–111)
Creatinine, Ser: 0.79 mg/dL (ref 0.44–1.00)
GFR, Estimated: >60 mL/min (ref >60)
Glucose, Bld: 137 mg/dL — ABNORMAL HIGH (ref 70–99)
Potassium: 3.5 mmol/L (ref 3.5–5.1)
Sodium: 135 mmol/L (ref 135–145)
Total Bilirubin: 0.9 mg/dL (ref 0.3–1.2)
Total Protein: 7.2 g/dL (ref 6.5–8.1)

## 2020-10-02 LAB — URINALYSIS, ROUTINE W REFLEX MICROSCOPIC
Bilirubin Urine: NEGATIVE
Glucose, UA: NEGATIVE mg/dL
Hgb urine dipstick: NEGATIVE
Ketones, ur: NEGATIVE mg/dL
Nitrite: NEGATIVE
Protein, ur: NEGATIVE mg/dL
Specific Gravity, Urine: 1.012 (ref 1.005–1.030)
pH: 5 (ref 5.0–8.0)

## 2020-10-02 LAB — LACTIC ACID, PLASMA: Lactic Acid, Venous: 1.2 mmol/L (ref 0.5–1.9)

## 2020-10-02 LAB — CBC WITH DIFFERENTIAL/PLATELET
Abs Immature Granulocytes: 0.06 10*3/uL (ref 0.00–0.07)
Basophils Absolute: 0.1 10*3/uL (ref 0.0–0.1)
Basophils Relative: 1 %
Eosinophils Absolute: 0.2 10*3/uL (ref 0.0–0.5)
Eosinophils Relative: 2 %
HCT: 41.3 % (ref 36.0–46.0)
Hemoglobin: 13.4 g/dL (ref 12.0–15.0)
Immature Granulocytes: 1 %
Lymphocytes Relative: 21 %
Lymphs Abs: 2.6 10*3/uL (ref 0.7–4.0)
MCH: 29.8 pg (ref 26.0–34.0)
MCHC: 32.4 g/dL (ref 30.0–36.0)
MCV: 92 fL (ref 80.0–100.0)
Monocytes Absolute: 0.9 10*3/uL (ref 0.1–1.0)
Monocytes Relative: 7 %
Neutro Abs: 8.5 10*3/uL — ABNORMAL HIGH (ref 1.7–7.7)
Neutrophils Relative %: 68 %
Platelets: 321 10*3/uL (ref 150–400)
RBC: 4.49 MIL/uL (ref 3.87–5.11)
RDW: 13.1 % (ref 11.5–15.5)
WBC: 12.2 10*3/uL — ABNORMAL HIGH (ref 4.0–10.5)
nRBC: 0 % (ref 0.0–0.2)

## 2020-10-02 LAB — I-STAT VENOUS BLOOD GAS, ED
Acid-Base Excess: 3 mmol/L — ABNORMAL HIGH (ref 0.0–2.0)
Bicarbonate: 26.8 mmol/L (ref 20.0–28.0)
Calcium, Ion: 1.13 mmol/L — ABNORMAL LOW (ref 1.15–1.40)
HCT: 41 % (ref 36.0–46.0)
Hemoglobin: 13.9 g/dL (ref 12.0–15.0)
O2 Saturation: 90 %
Potassium: 3.6 mmol/L (ref 3.5–5.1)
Sodium: 138 mmol/L (ref 135–145)
TCO2: 28 mmol/L (ref 22–32)
pCO2, Ven: 36.6 mmHg — ABNORMAL LOW (ref 44.0–60.0)
pH, Ven: 7.473 — ABNORMAL HIGH (ref 7.250–7.430)
pO2, Ven: 55 mmHg — ABNORMAL HIGH (ref 32.0–45.0)

## 2020-10-02 LAB — PROTIME-INR
INR: 1.1 (ref 0.8–1.2)
Prothrombin Time: 13.8 seconds (ref 11.4–15.2)

## 2020-10-02 LAB — RESP PANEL BY RT-PCR (FLU A&B, COVID) ARPGX2
Influenza A by PCR: NEGATIVE
Influenza B by PCR: NEGATIVE
SARS Coronavirus 2 by RT PCR: NEGATIVE

## 2020-10-02 LAB — GROUP A STREP BY PCR: Group A Strep by PCR: DETECTED — AB

## 2020-10-02 LAB — APTT: aPTT: 31 seconds (ref 24–36)

## 2020-10-02 LAB — TROPONIN I (HIGH SENSITIVITY): Troponin I (High Sensitivity): 8 ng/L (ref <18)

## 2020-10-02 MED ORDER — AZITHROMYCIN 500 MG PO TABS: 500.0000 mg | ORAL_TABLET | Freq: Every day | ORAL | 0 refills | Status: AC

## 2020-10-02 MED ORDER — AZITHROMYCIN 250 MG PO TABS
500.0000 mg | ORAL_TABLET | Freq: Once | ORAL | Status: AC
  Administered 2020-10-02: 500 mg via ORAL
  Filled 2020-10-02: qty 2

## 2020-10-02 NOTE — Discharge Instructions (Addendum)
You were seen in the emergency department for his fever sore throat And fatigue.  You had blood work EKG chest x-ray urinalysis Covid testing and strep testing.  Your Covid test was negative.  Your strep test was positive and we are treating you with antibiotics.  Please follow-up with your regular doctor.  Return to the emergency department for any worsening or concerning symptoms

## 2020-10-02 NOTE — ED Notes (Signed)
Pt asked if she is able to provide a urine sample. Pt unable at this time but willing to try later.

## 2020-10-02 NOTE — ED Triage Notes (Signed)
Patient brought to ED via POV with c/o fever, sore throat, fatigue, left arm numbness x 3 days. Alert and oriented in ED.
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
## 2020-10-03 LAB — URINE CULTURE

## 2020-10-07 LAB — CULTURE, BLOOD (ROUTINE X 2)
Culture: NO GROWTH
Culture: NO GROWTH
Special Requests: ADEQUATE

## 2021-07-19 ENCOUNTER — Ambulatory Visit: Payer: Medicare Other | Admitting: Adult Health

## 2021-10-12 ENCOUNTER — Ambulatory Visit: Payer: Medicare Other | Admitting: Neurology

## 2021-11-18 IMAGING — DX DG CHEST 1V PORT
1 series · 2 of 2 positions shown · non-contrast
Comparison: April 03, 2012

CLINICAL DATA: Question sepsis

EXAM:
PORTABLE CHEST 1 VIEW

[Series 1: chest · 0.14mm/px · 2 of 2 slices shown]
[im 1/2]
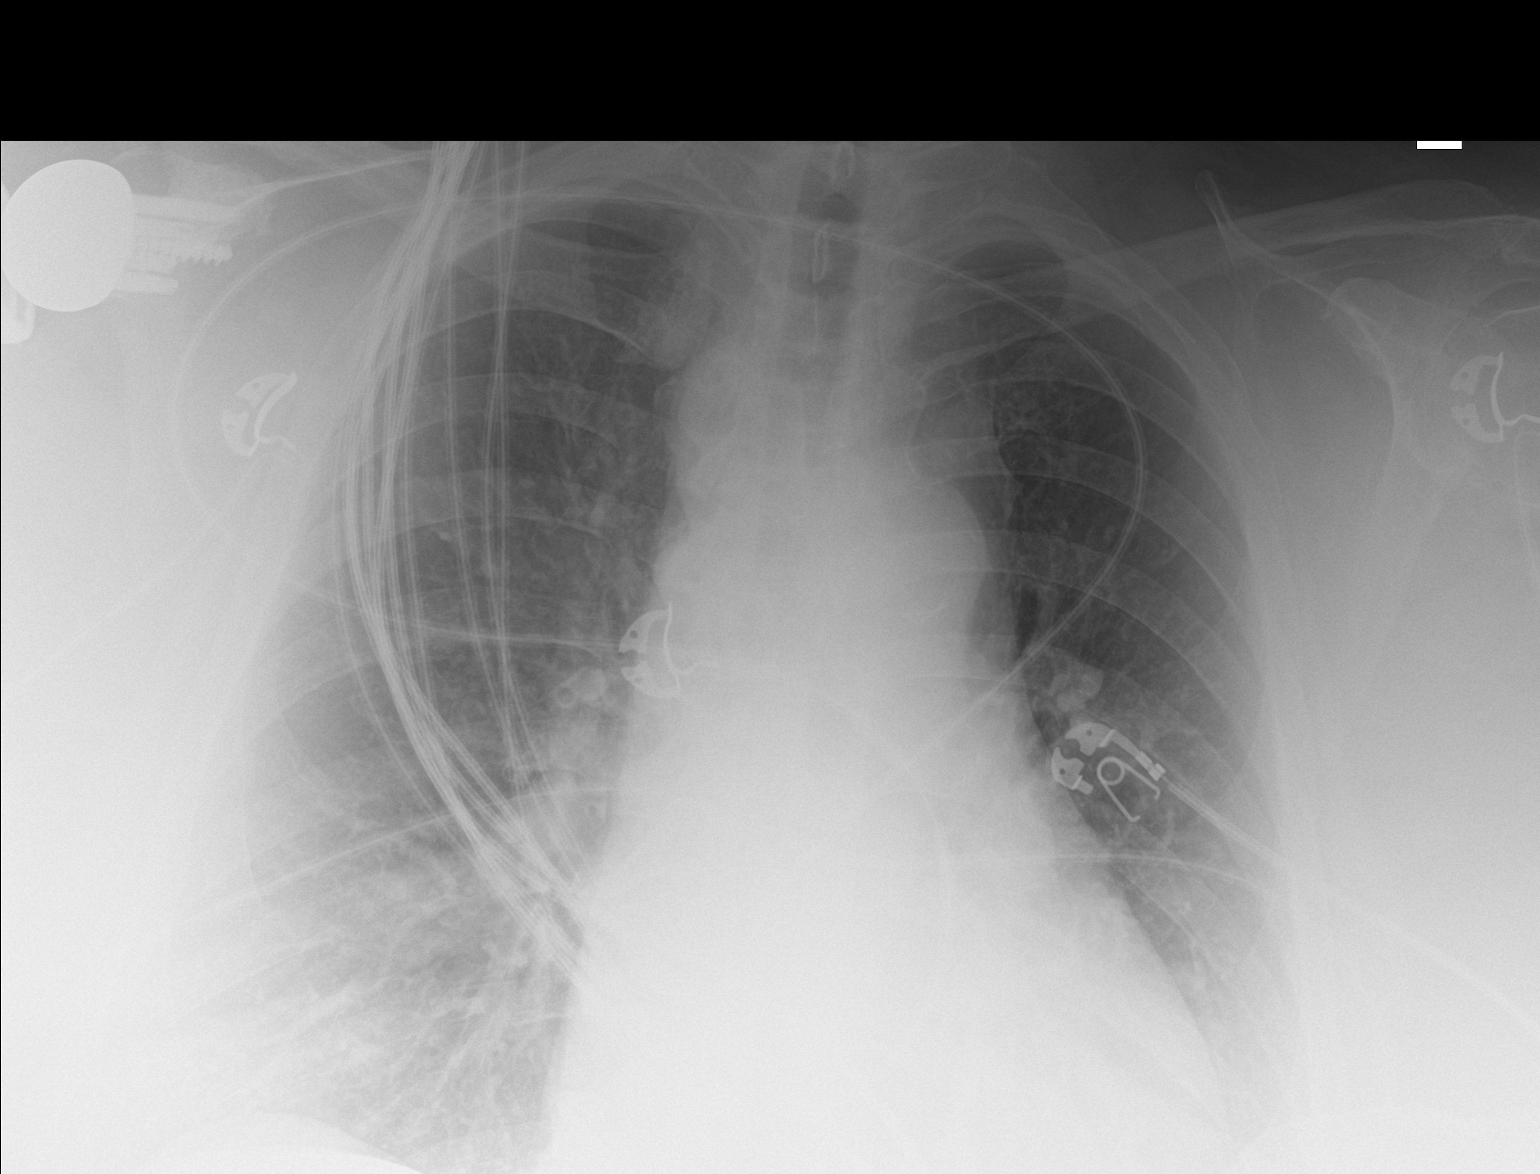
[im 2/2]
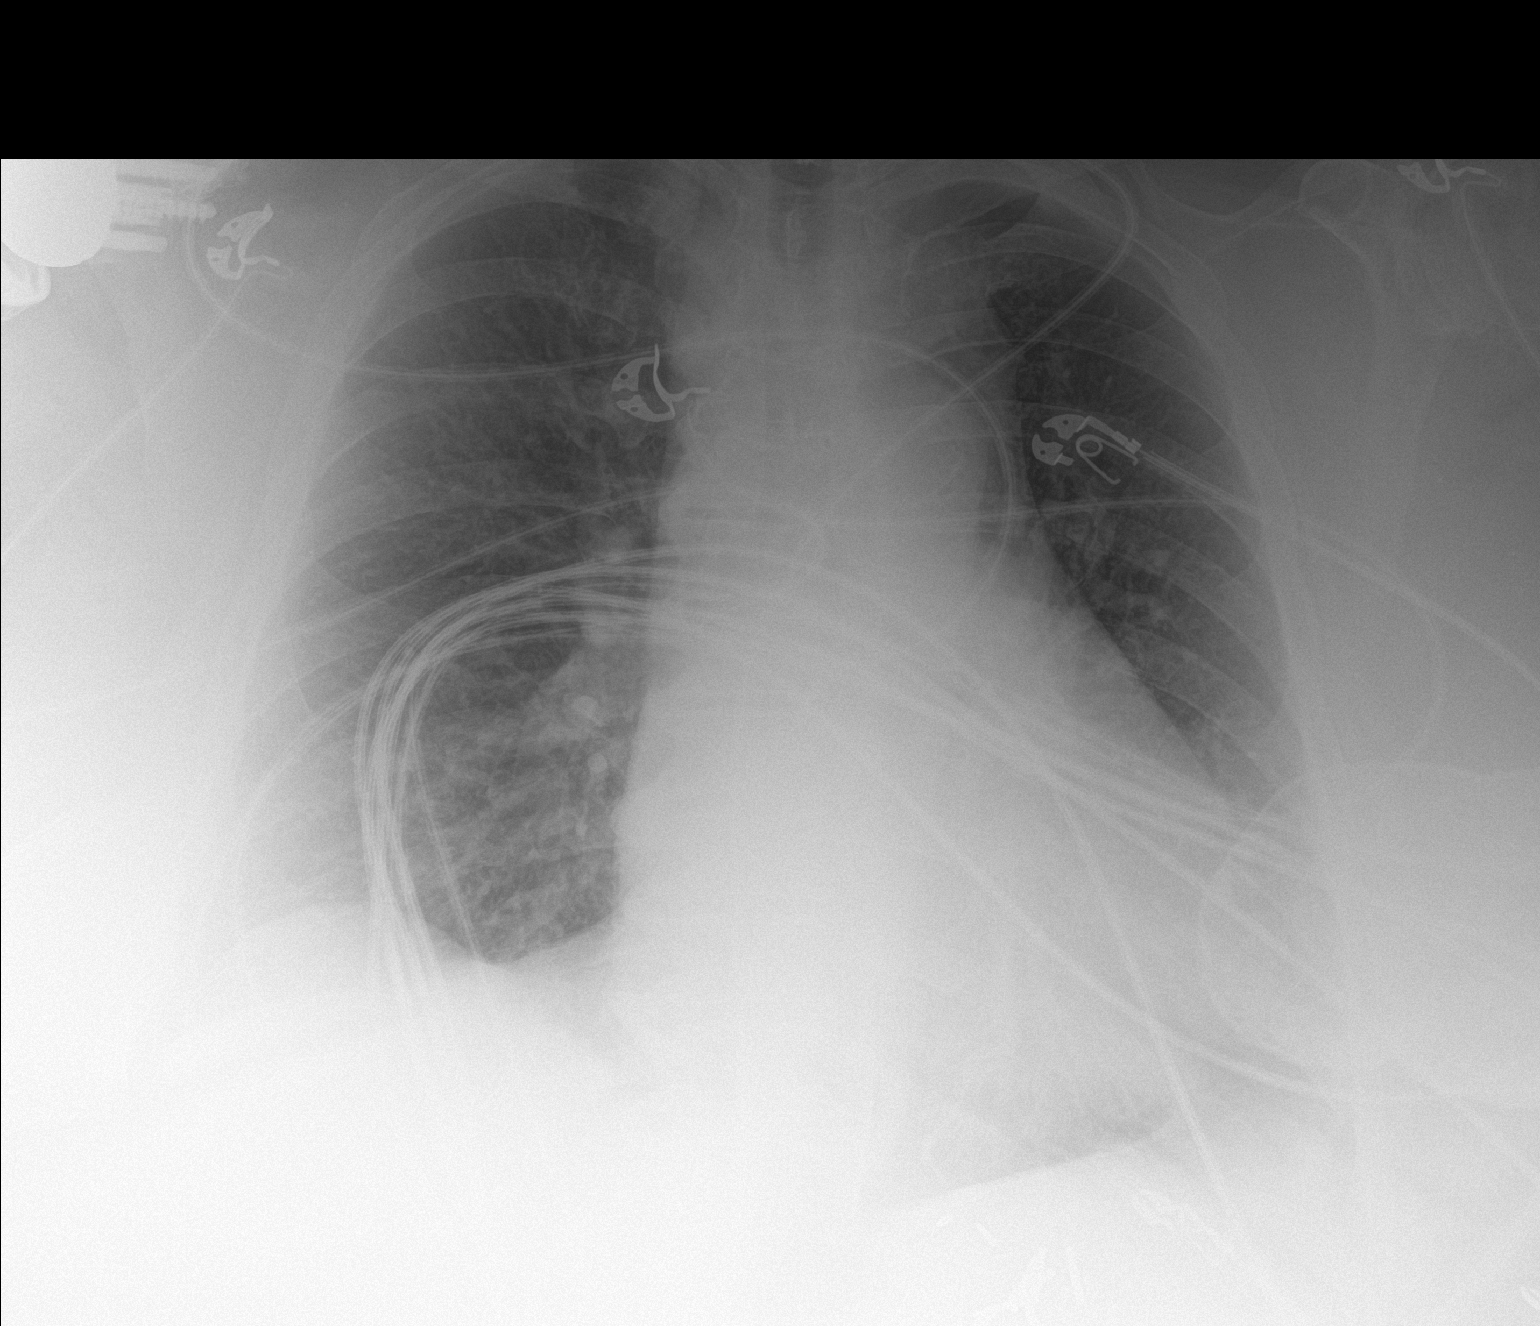

[2 of 2 positions shown; findings below may reference images not displayed]

FINDINGS: The mediastinal contour is normal. The heart size is enlarged. Both
lungs are clear. The visualized skeletal structures are
unremarkable. Right shoulder replacement is noted.
IMPRESSION: No active cardiopulmonary disease.

## 2022-01-01 ENCOUNTER — Telehealth: Payer: Self-pay | Admitting: Adult Health

## 2022-01-01 ENCOUNTER — Ambulatory Visit: Payer: Medicare Other | Admitting: Neurology

## 2022-01-01 NOTE — Telephone Encounter (Signed)
Pt cancelled appt due to feeling sick; low grade fever, congested, sore throat. Reschedule appt.

## 2022-02-07 ENCOUNTER — Ambulatory Visit: Payer: Medicare Other | Admitting: Neurology

## 2022-02-07 ENCOUNTER — Encounter: Payer: Self-pay | Admitting: Neurology

## 2022-05-01 ENCOUNTER — Encounter (HOSPITAL_BASED_OUTPATIENT_CLINIC_OR_DEPARTMENT_OTHER): Payer: Self-pay

## 2022-05-01 ENCOUNTER — Emergency Department (HOSPITAL_BASED_OUTPATIENT_CLINIC_OR_DEPARTMENT_OTHER): Payer: Medicare Other

## 2022-05-01 ENCOUNTER — Emergency Department (HOSPITAL_BASED_OUTPATIENT_CLINIC_OR_DEPARTMENT_OTHER)
Admission: EM | Admit: 2022-05-01 | Discharge: 2022-05-01 | Disposition: A | Discharge status: Home / Self Care | Payer: Medicare Other | Attending: Emergency Medicine | Admitting: Emergency Medicine

## 2022-05-01 ENCOUNTER — Other Ambulatory Visit: Payer: Self-pay

## 2022-05-01 DIAGNOSIS — W109XXA Fall (on) (from) unspecified stairs and steps, initial encounter: Secondary | ICD-10-CM | POA: Diagnosis not present

## 2022-05-01 DIAGNOSIS — R110 Nausea: Secondary | ICD-10-CM | POA: Insufficient documentation

## 2022-05-01 DIAGNOSIS — R42 Dizziness and giddiness: Secondary | ICD-10-CM | POA: Diagnosis present

## 2022-05-01 DIAGNOSIS — Z5321 Procedure and treatment not carried out due to patient leaving prior to being seen by health care provider: Secondary | ICD-10-CM | POA: Diagnosis not present

## 2022-05-01 NOTE — ED Triage Notes (Signed)
Pt presents POV from home, wheel chair to triage, ambulatory to ED, NAD, CA/Ox4     Pt reports falling up the stairs approx 11am this morning. Pt presents with an ice pack to her forehead. Pt reports a mechanical fall, hit the wall with her head  Per daughter pt did not remember much after the fall, endorses dizziness immediately afterwards associated with nausea, denies vomiting

## 2022-05-01 NOTE — ED Notes (Signed)
Pt called x2 for room w/no answer

## 2022-05-01 NOTE — ED Notes (Signed)
Pt called X 2. No answer. Informed Lorenza Evangelist. Pt will be moved OTF.

## 2023-01-30 ENCOUNTER — Ambulatory Visit: Payer: Medicare Other | Admitting: Neurology

## 2023-01-30 ENCOUNTER — Encounter: Payer: Self-pay | Admitting: Neurology

## 2023-01-30 VITALS — BP 151/77 | HR 86 | Ht 66.0 in | Wt 327.0 lb

## 2023-01-30 DIAGNOSIS — K439 Ventral hernia without obstruction or gangrene: Secondary | ICD-10-CM | POA: Diagnosis not present

## 2023-01-30 DIAGNOSIS — J4540 Moderate persistent asthma, uncomplicated: Secondary | ICD-10-CM | POA: Diagnosis not present

## 2023-01-30 DIAGNOSIS — C541 Malignant neoplasm of endometrium: Secondary | ICD-10-CM

## 2023-01-30 DIAGNOSIS — G4730 Sleep apnea, unspecified: Secondary | ICD-10-CM | POA: Diagnosis not present
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
## 2023-01-30 NOTE — Patient Instructions (Addendum)
77 y.o. year old female  here with:   BIPAP : Danielle Stephenson has shown a 97% compliance for her CPAP use is actually a BiPAP with a setting of 18 over 13 cm water pressure, her residual AHI is excellent 0.7/h she does have a high air leak in spite of using a fullface mask.  She hates her fullface mask.  So we will switch to a nasal mask and I have decided that a medium size ESON  should be tried first.      1) acute grief - lost her life partner just a couple of months ago. Can't sleep well, dreams a lot. Tearful.  This is psychological and not apnea elated. Buspar is helping but it didn't help sleep.      2) Her risk factors for OSA have not changed  ( super obesity) and she hates the BiPAP MASK, a FFM - and she had no opportunity to meet after it was prescribed. I will be happy to help her compliance with a change to an Eson mask.    3)  I will ask her to use 18/ 13 cm  BiPAP with a nasal mask. Rv in 6 months with NP to  document compliance.      I plan to follow up either personally or through our NP within 6 months.    I would like to thank Stevphen Rochester, Md [address] [place],  [place] [address] for allowing me to meet with and to take care of this pleasant patient.    CC: I will share my notes with Butch Penny, NP .   CPAP and BIPAP Information CPAP and BIPAP are methods that use air pressure to keep your airways open and to help you breathe well. CPAP and BIPAP use different amounts of pressure. Your health care provider will tell you whether CPAP or BIPAP would be more helpful for you. CPAP stands for "continuous positive airway pressure." With CPAP, the amount of pressure stays the same while you breathe in (inhale) and out (exhale). BIPAP stands for "bi-level positive airway pressure." With BIPAP, the amount of pressure will be higher when you inhale and lower when you exhale. This allows you to take larger breaths. CPAP or BIPAP may be used in the hospital, or  your health care provider may want you to use it at home. You may need to have a sleep study before your health care provider can order a machine for you to use at home. What are the advantages? CPAP or BIPAP can be helpful if you have: Sleep apnea. Chronic obstructive pulmonary disease (COPD). Heart failure. Medical conditions that cause muscle weakness, including muscular dystrophy or amyotrophic lateral sclerosis (ALS). Other problems that cause breathing to be shallow, weak, abnormal, or difficult. CPAP and BIPAP are most commonly used for obstructive sleep apnea (OSA) to keep the airways from collapsing when the muscles relax during sleep. What are the risks? Generally, this is a safe treatment. However, problems may occur, including: Irritated skin or skin sores if the mask does not fit properly. Dry or stuffy nose or nosebleeds. Dry mouth. Feeling gassy or bloated. Sinus or lung infection if the equipment is not cleaned properly. When should CPAP or BIPAP be used? In most cases, the mask only needs to be worn during sleep. Generally, the mask needs to be worn throughout the night and during any daytime naps. People with certain medical conditions may also need to wear the  mask at other times, such as when they are awake. Follow instructions from your health care provider about when to use the machine. What happens during CPAP or BIPAP?  Both CPAP and BIPAP are provided by a small machine with a flexible plastic tube that attaches to a plastic mask that you wear. Air is blown through the mask into your nose or mouth. The amount of pressure that is used to blow the air can be adjusted on the machine. Your health care provider will set the pressure setting and help you find the best mask for you. Tips for using the mask Because the mask needs to be snug, some people feel trapped or closed-in (claustrophobic) when first using the mask. If you feel this way, you may need to get used to the  mask. One way to do this is to hold the mask loosely over your nose or mouth and then gradually apply the mask more snugly. You can also gradually increase the amount of time that you use the mask. Masks are available in various types and sizes. If your mask does not fit well, talk with your health care provider about getting a different one. Some common types of masks include: Full face masks, which fit over the mouth and nose. Nasal masks, which fit over the nose. Nasal pillow or prong masks, which fit into the nostrils. If you are using a mask that fits over your nose and you tend to breathe through your mouth, a chin strap may be applied to help keep your mouth closed. Use a skin barrier to protect your skin as told by your health care provider. Some CPAP and BIPAP machines have alarms that may sound if the mask comes off or develops a leak. If you have trouble with the mask, it is very important that you talk with your health care provider about finding a way to make the mask easier to tolerate. Do not stop using the mask. There could be a negative impact on your health if you stop using the mask. Tips for using the machine Place your CPAP or BIPAP machine on a secure table or stand near an electrical outlet. Know where the on/off switch is on the machine. Follow instructions from your health care provider about how to set the pressure on your machine and when you should use it. Do not eat or drink while the CPAP or BIPAP machine is on. Food or fluids could get pushed into your lungs by the pressure of the CPAP or BIPAP. For home use, CPAP and BIPAP machines can be rented or purchased through home health care companies. Many different brands of machines are available. Renting a machine before purchasing may help you find out which particular machine works well for you. Your health insurance company may also decide which machine you may get. Keep the CPAP or BIPAP machine and attachments clean.  Ask your health care provider for specific instructions. Check the humidifier if you have a dry stuffy nose or nosebleeds. Make sure it is working correctly. Follow these instructions at home: Take over-the-counter and prescription medicines only as told by your health care provider. Ask if you can take sinus medicine if your sinuses are blocked. Do not use any products that contain nicotine or tobacco. These products include cigarettes, chewing tobacco, and vaping devices, such as e-cigarettes. If you need help quitting, ask your health care provider. Keep all follow-up visits. This is important. Contact a health care provider if: You have  redness or pressure sores on your head, face, mouth, or nose from the mask or head gear. You have trouble using the CPAP or BIPAP machine. You cannot tolerate wearing the CPAP or BIPAP mask. Someone tells you that you snore even when wearing your CPAP or BIPAP. Get help right away if: You have trouble breathing. You feel confused. Summary CPAP and BIPAP are methods that use air pressure to keep your airways open and to help you breathe well. If you have trouble with the mask, it is very important that you talk with your health care provider about finding a way to make the mask easier to tolerate. Do not stop using the mask. There could be a negative impact to your health if you stop using the mask. Follow instructions from your health care provider about when to use the machine. This information is not intended to replace advice given to you by your health care provider. Make sure you discuss any questions you have with your health care provider. Document Revised: 02/08/2021 Document Reviewed: 06/10/2020 Elsevier Patient Education  2023 [employer].

## 2023-03-06 ENCOUNTER — Telehealth: Payer: Self-pay | Admitting: Neurology

## 2023-03-06 NOTE — Telephone Encounter (Signed)
Order was sent on 7/17. I will send another message to [employer]

## 2023-03-06 NOTE — Telephone Encounter (Signed)
Pt reports that the DME is still waiting on the order for the CPAP, please send

## 2023-06-02 ENCOUNTER — Other Ambulatory Visit: Payer: Self-pay

## 2023-06-02 ENCOUNTER — Emergency Department (HOSPITAL_BASED_OUTPATIENT_CLINIC_OR_DEPARTMENT_OTHER)
Admission: EM | Admit: 2023-06-02 | Discharge: 2023-06-02 | Disposition: A | Discharge status: Home / Self Care | Payer: Medicare Other | Attending: Emergency Medicine | Admitting: Emergency Medicine

## 2023-06-02 ENCOUNTER — Emergency Department (HOSPITAL_BASED_OUTPATIENT_CLINIC_OR_DEPARTMENT_OTHER): Payer: Medicare Other | Admitting: Radiology

## 2023-06-02 ENCOUNTER — Encounter (HOSPITAL_BASED_OUTPATIENT_CLINIC_OR_DEPARTMENT_OTHER): Payer: Self-pay

## 2023-06-02 DIAGNOSIS — M545 Low back pain, unspecified: Secondary | ICD-10-CM | POA: Diagnosis present

## 2023-06-02 DIAGNOSIS — E119 Type 2 diabetes mellitus without complications: Secondary | ICD-10-CM | POA: Insufficient documentation

## 2023-06-02 DIAGNOSIS — M5440 Lumbago with sciatica, unspecified side: Secondary | ICD-10-CM | POA: Diagnosis not present

## 2023-06-02 DIAGNOSIS — M5431 Sciatica, right side: Secondary | ICD-10-CM

## 2023-06-02 DIAGNOSIS — Z79899 Other long term (current) drug therapy: Secondary | ICD-10-CM | POA: Insufficient documentation

## 2023-06-02 MED ORDER — HYDROMORPHONE HCL 1 MG/ML IJ SOLN
2.0000 mg | Freq: Once | INTRAMUSCULAR | Status: AC
  Administered 2023-06-02: 2 mg via INTRAMUSCULAR
  Filled 2023-06-02: qty 2

## 2023-06-02 MED ORDER — KETOROLAC TROMETHAMINE 60 MG/2ML IM SOLN
60.0000 mg | Freq: Once | INTRAMUSCULAR | Status: AC
  Administered 2023-06-02: 60 mg via INTRAMUSCULAR
  Filled 2023-06-02: qty 2

## 2023-06-02 MED ORDER — PREDNISONE 10 MG PO TABS: 20.0000 mg | ORAL_TABLET | Freq: Two times a day (BID) | ORAL | 0 refills | Status: DC

## 2023-06-02 MED ORDER — PREDNISONE 50 MG PO TABS
60.0000 mg | ORAL_TABLET | Freq: Once | ORAL | Status: AC
  Administered 2023-06-02: 60 mg via ORAL
  Filled 2023-06-02: qty 1

## 2023-06-02 MED ORDER — OXYCODONE-ACETAMINOPHEN 5-325 MG PO TABS: 1.0000 | ORAL_TABLET | Freq: Four times a day (QID) | ORAL | 0 refills | Status: DC | PRN

## 2023-06-02 NOTE — ED Triage Notes (Signed)
Pt reports that she had sudden onset of mid-scapular back pain x 2-3 weeks ago that she reports as being "manageable". Pt states that she now has sciatica pain radiating into right leg. Pt denies specific injury.

## 2023-06-02 NOTE — Discharge Instructions (Signed)
Begin taking prednisone as prescribed.  Take Percocet as prescribed as needed for pain.  Follow-up with primary doctor if not improving in the next 3 to 4 days.

## 2023-06-02 NOTE — ED Notes (Signed)
Reviewed AVS with patient, patient expressed understanding of directions, denies further questions at this time. 

## 2023-06-02 NOTE — ED Provider Notes (Signed)
[place] EMERGENCY DEPARTMENT AT [facility] Provider Note   CSN: [id number] Arrival date & time: 06/02/23  0127     History  Chief Complaint  Patient presents with   Back Pain    Danielle Stephenson is a 77 y.o. female.  Patient is a 77 year old female with history of vertigo, type 2 diabetes, endometrial adenocarcinoma, obesity.  Patient presenting today for evaluation of back pain.  Patient describes pain to her right lower lumbar region/upper buttock that radiates down her leg.  This has been worsening over the past 2 days.  Patient describes pain with movement and ambulation.  There are no aggravating or alleviating factors.  Her pain is severe and uncontrolled with home medications.  She reports sciatica in the past and this feels similar.  The history is provided by the patient.       Home Medications Prior to Admission medications   Medication Sig Start Date End Date Taking? Authorizing Provider  albuterol (VENTOLIN HFA) 108 (90 Base) MCG/ACT inhaler Inhale 1-2 puffs into the lungs every 4 (four) hours as needed for wheezing.     [provider]  amLODipine (NORVASC) 10 MG tablet Take 10 mg by mouth daily. 07/17/17   [provider]  carvedilol (COREG) 12.5 MG tablet Take 1 tablet by mouth in the morning and at bedtime.  10/15/19   [provider]  Cholecalciferol (VITAMIN D) 2000 UNITS tablet Take 2,000 Units by mouth daily.    [provider]  FLUoxetine (PROZAC) 20 MG capsule Take 20 mg by mouth daily. 04/07/19   [provider]  FLUoxetine (PROZAC) 40 MG capsule Take 40 mg by mouth daily. 01/08/22   [provider]  Fluticasone-Salmeterol (ADVAIR) 500-50 MCG/DOSE AEPB Inhale 1 puff into the lungs daily. Patient not taking: Reported on 01/30/2023    [provider]  hydrochlorothiazide (HYDRODIURIL) 25 MG tablet Take 25 mg by mouth daily. 11/10/19   [provider]  levonorgestrel (MIRENA) 20 MCG/24HR  IUD 1 Intra Uterine Device (1 each total) by Intrauterine route once for 1 dose. To be placed in OR Patient taking differently: 1 each by Intrauterine route continuous. 06/05/19 06/05/19  Warner Mccreedy D, NP  losartan (COZAAR) 100 MG tablet Take 100 mg by mouth daily. 07/08/17   [provider]  montelukast (SINGULAIR) 5 MG chewable tablet Chew 5 mg by mouth daily. 04/28/19   [provider]  Multiple Vitamins-Minerals (MULTIVITAMIN WITH MINERALS) tablet Take 1 tablet by mouth daily.    [provider]  rosuvastatin (CRESTOR) 20 MG tablet Take 20 mg by mouth daily.    [provider]  VICTOZA 18 MG/3ML SOPN Inject 0.6 mg into the skin daily. 08/11/20   [provider]      Allergies    Penicillins, Ciprofloxacin, Iodine, Demerol [meperidine], and Tape    Review of Systems   Review of Systems  All other systems reviewed and are negative.   Physical Exam Updated Vital Signs BP 126/73   Pulse 74   Temp (!) 97.3 F (36.3 C)   Resp 20   Ht 5\' 6"  (1.676 m)   Wt 136.1 kg   SpO2 98%   BMI 48.42 kg/m  Physical Exam Vitals and nursing note reviewed.  Constitutional:      Appearance: Normal appearance.  HENT:     Head: Normocephalic and atraumatic.  Pulmonary:     Effort: Pulmonary effort is normal.  Musculoskeletal:     Comments: There is tenderness  to palpation in the right lower lumbar/right buttock region.  Strength is 5 out of 5 in both lower extremities.  She is ambulatory, but with considerable discomfort.  Skin:    General: Skin is warm and dry.  Neurological:     Mental Status: She is alert and oriented to person, place, and time.     Comments: DTRs are trace and symmetrical in the patellar and Achilles tendons bilaterally.  Strength is 5 out of 5 in both lower extremities.     ED Results / Procedures / Treatments   Labs (all labs ordered are listed, but only abnormal results are displayed) Labs Reviewed - No data to  display  EKG None  Radiology No results found.  Procedures Procedures    Medications Ordered in ED Medications  HYDROmorphone (DILAUDID) injection 2 mg (has no administration in time range)  ketorolac (TORADOL) injection 60 mg (has no administration in time range)  predniSONE (DELTASONE) tablet 60 mg (has no administration in time range)    ED Course/ Medical Decision Making/ A&P  Patient is a 77 year old female presenting with right sided low back pain as described in the HPI.  Symptoms are most consistent with sciatica.  She is neurologically intact and there are no red flags that would suggest an emergent situation.  X-rays were obtained showing no evidence for compression fracture, but does show degenerative changes.  Patient has been given IM Toradol and Dilaudid along with oral prednisone and seems to be feeling better.  Patient to be discharged with prednisone and pain medication.  To follow-up as needed if not improving.  Final Clinical Impression(s) / ED Diagnoses Final diagnoses:  None    Rx / DC Orders ED Discharge Orders     None         Geoffery Lyons, MD 06/02/23 [phone]

## 2023-06-03 ENCOUNTER — Other Ambulatory Visit: Payer: Self-pay

## 2023-06-03 ENCOUNTER — Emergency Department (HOSPITAL_BASED_OUTPATIENT_CLINIC_OR_DEPARTMENT_OTHER)
Admission: EM | Admit: 2023-06-03 | Discharge: 2023-06-03 | Disposition: A | Discharge status: Home / Self Care | Payer: Medicare Other

## 2023-06-03 DIAGNOSIS — M5441 Lumbago with sciatica, right side: Secondary | ICD-10-CM | POA: Insufficient documentation

## 2023-06-03 MED ORDER — KETOROLAC TROMETHAMINE 10 MG PO TABS: 10.0000 mg | ORAL_TABLET | Freq: Four times a day (QID) | ORAL | 0 refills | Status: DC | PRN

## 2023-06-03 MED ORDER — KETOROLAC TROMETHAMINE 60 MG/2ML IM SOLN
60.0000 mg | Freq: Once | INTRAMUSCULAR | Status: AC
  Administered 2023-06-03: 60 mg via INTRAMUSCULAR
  Filled 2023-06-03: qty 2

## 2023-06-03 MED ORDER — OXYCODONE-ACETAMINOPHEN 5-325 MG PO TABS
1.0000 | ORAL_TABLET | Freq: Once | ORAL | Status: AC
  Administered 2023-06-03: 1 via ORAL
  Filled 2023-06-03: qty 1

## 2023-06-03 MED ORDER — METHOCARBAMOL 500 MG PO TABS: 1000.0000 mg | ORAL_TABLET | Freq: Two times a day (BID) | ORAL | 0 refills | Status: DC

## 2023-06-03 MED ORDER — METHOCARBAMOL 500 MG PO TABS
750.0000 mg | ORAL_TABLET | Freq: Once | ORAL | Status: AC
  Administered 2023-06-03: 750 mg via ORAL
  Filled 2023-06-03: qty 2

## 2023-06-03 NOTE — ED Triage Notes (Signed)
C/o of lower back pain and sciatica pain radiating into R leg. Seen here Saturday for the same. Prescriptions sent for pain and have not been working per patient. 10/10 pain

## 2023-06-03 NOTE — ED Notes (Signed)
Pt ambulated to bathroom with assistance of daughter and cane. Pt placed back on monitor upon return to room

## 2023-06-03 NOTE — ED Provider Notes (Signed)
[place] EMERGENCY DEPARTMENT AT [facility] Provider Note   CSN: [id number] Arrival date & time: 06/03/23  1520     History  Chief Complaint  Patient presents with   Back Pain    Danielle Stephenson is a 77 y.o. female.  Patient to ED with persistent low back pain with radiation to the right leg. Remote history of back issues but this is the first episode in many years. No falls or injury. Seen yesterday in the ED and pain was controlled while here and on discharge, but became severe again prompting return to the ED. No abdominal pain. No bowel/bladder dysfunction. She is taking oxycodone and prednisone but pain is uncontrolled at home. No new symptoms.   The history is provided by the patient and a relative. No language interpreter was used.  Back Pain      Home Medications Prior to Admission medications   Medication Sig Start Date End Date Taking? Authorizing Provider  ketorolac (TORADOL) 10 MG tablet Take 1 tablet (10 mg total) by mouth every 6 (six) hours as needed. Maximum dose 40 mg in 24 hour period. 06/03/23  Yes Orlanda Frankum, Melvenia Beam, PA-C  methocarbamol (ROBAXIN) 500 MG tablet Take 2 tablets (1,000 mg total) by mouth 2 (two) times daily. 06/03/23  Yes Athony Coppa, Melvenia Beam, PA-C  albuterol (VENTOLIN HFA) 108 (90 Base) MCG/ACT inhaler Inhale 1-2 puffs into the lungs every 4 (four) hours as needed for wheezing.     [provider]  amLODipine (NORVASC) 10 MG tablet Take 10 mg by mouth daily. 07/17/17   [provider]  carvedilol (COREG) 12.5 MG tablet Take 1 tablet by mouth in the morning and at bedtime.  10/15/19   [provider]  Cholecalciferol (VITAMIN D) 2000 UNITS tablet Take 2,000 Units by mouth daily.    [provider]  FLUoxetine (PROZAC) 20 MG capsule Take 20 mg by mouth daily. 04/07/19   [provider]  FLUoxetine (PROZAC) 40 MG capsule Take 40 mg by mouth daily. 01/08/22   [provider]  Fluticasone-Salmeterol  (ADVAIR) 500-50 MCG/DOSE AEPB Inhale 1 puff into the lungs daily. Patient not taking: Reported on 01/30/2023    [provider]  hydrochlorothiazide (HYDRODIURIL) 25 MG tablet Take 25 mg by mouth daily. 11/10/19   [provider]  levonorgestrel (MIRENA) 20 MCG/24HR IUD 1 Intra Uterine Device (1 each total) by Intrauterine route once for 1 dose. To be placed in OR Patient taking differently: 1 each by Intrauterine route continuous. 06/05/19 06/05/19  Warner Mccreedy D, NP  losartan (COZAAR) 100 MG tablet Take 100 mg by mouth daily. 07/08/17   [provider]  montelukast (SINGULAIR) 5 MG chewable tablet Chew 5 mg by mouth daily. 04/28/19   [provider]  Multiple Vitamins-Minerals (MULTIVITAMIN WITH MINERALS) tablet Take 1 tablet by mouth daily.    [provider]  oxyCODONE-acetaminophen (PERCOCET) 5-325 MG tablet Take 1-2 tablets by mouth every 6 (six) hours as needed. 06/02/23   Geoffery Lyons, MD  predniSONE (DELTASONE) 10 MG tablet Take 2 tablets (20 mg total) by mouth 2 (two) times daily with a meal. 06/02/23   Geoffery Lyons, MD  rosuvastatin (CRESTOR) 20 MG tablet Take 20 mg by mouth daily.    [provider]  VICTOZA 18 MG/3ML SOPN Inject 0.6 mg into the skin daily. 08/11/20   [provider]      Allergies    Penicillins, Ciprofloxacin, Iodine, Demerol [meperidine], and Tape    Review of Systems  Review of Systems  Musculoskeletal:  Positive for back pain.    Physical Exam Updated Vital Signs BP 115/71   Pulse 73   Temp 98.6 F (37 C) (Oral)   Resp 20   SpO2 96%  Physical Exam Vitals and nursing note reviewed.  Constitutional:      Appearance: Normal appearance. She is obese.  Musculoskeletal:        General: Normal range of motion.       Legs:     Comments: Point tenderness without swelling or discoloration.  Skin:    General: Skin is warm and dry.  Neurological:     Mental Status: She is alert and  oriented to person, place, and time.     Sensory: Sensory deficit (Minimal sensory deficits right lower extremity to light touch.) present.     ED Results / Procedures / Treatments   Labs (all labs ordered are listed, but only abnormal results are displayed) Labs Reviewed - No data to display  EKG None  Radiology DG Lumbar Spine 2-3 Views  Result Date: 06/02/2023 CLINICAL DATA:  Back pain EXAM: LUMBAR SPINE - 2-3 VIEW COMPARISON:  CT 02/04/2014 FINDINGS: Normal alignment. No fracture. Diffuse degenerative disc disease with disc space narrowing and anterior spurring. Degenerative facet disease, most pronounced in the lower lumbar spine. SI joints symmetric. IMPRESSION: Diffuse degenerative disc and facet disease, most pronounced in the lower lumbar spine. No acute bony abnormality. Electronically Signed   By: Charlett Nose M.D.   On: 06/02/2023 03:11    Procedures Procedures    Medications Ordered in ED Medications  ketorolac (TORADOL) injection 60 mg (60 mg Intramuscular Given 06/03/23 1818)  methocarbamol (ROBAXIN) tablet 750 mg (750 mg Oral Given 06/03/23 1821)    ED Course/ Medical Decision Making/ A&P Clinical Course as of 06/03/23 1921  Mon Jun 03, 2023  1610 Patient to ED with uncontrolled lumbar back pain, remote history of same. No recent injury. No neurologic red flags. IM toradol provided, which she states works best, and pain is improved. Also provided Robaxin, which I will continue at home. She has established relationship with orthopedics. Encouraged to follow up with them for further evaluation and management. Will prescribe oral toradol. She reports an intolerance to prednisone so will d/c this medication. [SU]    Clinical Course User Index [SU] Elpidio Anis, PA-C                                 Medical Decision Making Risk Prescription drug management.           Final Clinical Impression(s) / ED Diagnoses Final diagnoses:  Acute midline low back  pain with right-sided sciatica    Rx / DC Orders ED Discharge Orders          Ordered    ketorolac (TORADOL) 10 MG tablet  Every 6 hours PRN        06/03/23 1919    methocarbamol (ROBAXIN) 500 MG tablet  2 times daily        06/03/23 1919              Elpidio Anis, PA-C 06/03/23 1921    Coral Spikes, DO 06/04/23 [phone]

## 2023-06-03 NOTE — Discharge Instructions (Signed)
You can stop taking prednisone as you have said your tolerance to this medication is very poor.   Take oral Toradol as prescribed, maximum 40 mg in one 24 hour period. I've added Robaxin twice daily as well. You can continue the oxycodone as previously prescribed as well. Do NOT add any other anti-inflammatory medications like Advil, Motrin, ibuprofen, naproxen or naprosyn.

## 2023-06-07 ENCOUNTER — Other Ambulatory Visit: Payer: Self-pay | Admitting: Family Medicine

## 2023-06-07 DIAGNOSIS — R29898 Other symptoms and signs involving the musculoskeletal system: Secondary | ICD-10-CM

## 2023-06-07 DIAGNOSIS — M5416 Radiculopathy, lumbar region: Secondary | ICD-10-CM

## 2023-06-16 ENCOUNTER — Other Ambulatory Visit: Payer: Self-pay

## 2023-06-16 ENCOUNTER — Encounter (HOSPITAL_COMMUNITY): Payer: Self-pay | Admitting: Emergency Medicine

## 2023-06-16 ENCOUNTER — Emergency Department (HOSPITAL_COMMUNITY): Payer: Medicare Other

## 2023-06-16 ENCOUNTER — Emergency Department (HOSPITAL_COMMUNITY)
Admission: EM | Admit: 2023-06-16 | Discharge: 2023-06-16 | Disposition: A | Discharge status: Home / Self Care | Payer: Medicare Other | Attending: Emergency Medicine | Admitting: Emergency Medicine

## 2023-06-16 DIAGNOSIS — E119 Type 2 diabetes mellitus without complications: Secondary | ICD-10-CM | POA: Diagnosis not present

## 2023-06-16 DIAGNOSIS — M5116 Intervertebral disc disorders with radiculopathy, lumbar region: Secondary | ICD-10-CM | POA: Insufficient documentation

## 2023-06-16 DIAGNOSIS — Z8542 Personal history of malignant neoplasm of other parts of uterus: Secondary | ICD-10-CM | POA: Diagnosis not present

## 2023-06-16 DIAGNOSIS — R000 Tachycardia, unspecified: Secondary | ICD-10-CM | POA: Diagnosis not present

## 2023-06-16 DIAGNOSIS — M545 Low back pain, unspecified: Secondary | ICD-10-CM | POA: Diagnosis present

## 2023-06-16 LAB — BASIC METABOLIC PANEL
Anion gap: 12 (ref 5–15)
BUN: 21 mg/dL (ref 8–23)
CO2: 21 mmol/L — ABNORMAL LOW (ref 22–32)
Calcium: 9.3 mg/dL (ref 8.9–10.3)
Chloride: 102 mmol/L (ref 98–111)
Creatinine, Ser: 1.01 mg/dL — ABNORMAL HIGH (ref 0.44–1.00)
GFR, Estimated: 58 mL/min — ABNORMAL LOW (ref >60)
Glucose, Bld: 141 mg/dL — ABNORMAL HIGH (ref 70–99)
Potassium: 2.8 mmol/L — ABNORMAL LOW (ref 3.5–5.1)
Sodium: 135 mmol/L (ref 135–145)

## 2023-06-16 LAB — CBC WITH DIFFERENTIAL/PLATELET
Abs Immature Granulocytes: 0.03 10*3/uL (ref 0.00–0.07)
Basophils Absolute: 0.1 10*3/uL (ref 0.0–0.1)
Basophils Relative: 1 %
Eosinophils Absolute: 0.1 10*3/uL (ref 0.0–0.5)
Eosinophils Relative: 1 %
HCT: 42 % (ref 36.0–46.0)
Hemoglobin: 14.3 g/dL (ref 12.0–15.0)
Immature Granulocytes: 0 %
Lymphocytes Relative: 25 %
Lymphs Abs: 2.4 10*3/uL (ref 0.7–4.0)
MCH: 31.8 pg (ref 26.0–34.0)
MCHC: 34 g/dL (ref 30.0–36.0)
MCV: 93.3 fL (ref 80.0–100.0)
Monocytes Absolute: 0.6 10*3/uL (ref 0.1–1.0)
Monocytes Relative: 6 %
Neutro Abs: 6.3 10*3/uL (ref 1.7–7.7)
Neutrophils Relative %: 67 %
Platelets: 353 10*3/uL (ref 150–400)
RBC: 4.5 MIL/uL (ref 3.87–5.11)
RDW: 12.4 % (ref 11.5–15.5)
WBC: 9.6 10*3/uL (ref 4.0–10.5)
nRBC: 0 % (ref 0.0–0.2)

## 2023-06-16 MED ORDER — DEXAMETHASONE SODIUM PHOSPHATE 10 MG/ML IJ SOLN
10.0000 mg | Freq: Once | INTRAMUSCULAR | Status: AC
  Administered 2023-06-16: 10 mg via INTRAVENOUS
  Filled 2023-06-16: qty 1

## 2023-06-16 MED ORDER — OXYCODONE-ACETAMINOPHEN 5-325 MG PO TABS
1.0000 | ORAL_TABLET | Freq: Once | ORAL | Status: AC
  Administered 2023-06-16: 1 via ORAL
  Filled 2023-06-16: qty 1

## 2023-06-16 MED ORDER — FENTANYL CITRATE PF 50 MCG/ML IJ SOSY
50.0000 ug | PREFILLED_SYRINGE | INTRAMUSCULAR | Status: DC | PRN
  Administered 2023-06-16: 50 ug via INTRAVENOUS
  Filled 2023-06-16: qty 1

## 2023-06-16 MED ORDER — OXYCODONE-ACETAMINOPHEN 5-325 MG PO TABS: 1.0000 | ORAL_TABLET | Freq: Once | ORAL | Status: DC

## 2023-06-16 MED ORDER — DEXAMETHASONE 4 MG PO TABS: 4.0000 mg | ORAL_TABLET | Freq: Two times a day (BID) | ORAL | 0 refills | Status: DC

## 2023-06-16 MED ORDER — POTASSIUM CHLORIDE CRYS ER 20 MEQ PO TBCR
40.0000 meq | EXTENDED_RELEASE_TABLET | Freq: Once | ORAL | Status: AC
  Administered 2023-06-16: 40 meq via ORAL
  Filled 2023-06-16: qty 2

## 2023-06-16 MED ORDER — OXYCODONE-ACETAMINOPHEN 5-325 MG PO TABS
1.0000 | ORAL_TABLET | Freq: Four times a day (QID) | ORAL | 0 refills | Status: DC | PRN
Start: 2023-06-16 — End: 2023-09-01
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
## 2023-06-16 NOTE — Discharge Instructions (Addendum)
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  Neurosurgery is recommending steroids and pain medication, follow-up with them this week.  Please call the office to schedule.  Return to the emergency department if any worsening or concerning symptoms.

## 2023-06-16 NOTE — ED Notes (Signed)
Pt to MRI, requesting to hold pain medications until she returns.
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
## 2023-06-18 ENCOUNTER — Ambulatory Visit: Payer: Medicare Other | Admitting: Adult Health

## 2023-06-30 ENCOUNTER — Emergency Department (HOSPITAL_COMMUNITY): Payer: Medicare Other

## 2023-06-30 ENCOUNTER — Other Ambulatory Visit: Payer: Self-pay

## 2023-06-30 ENCOUNTER — Inpatient Hospital Stay (HOSPITAL_COMMUNITY)
Admission: EM | Admit: 2023-06-30 | Discharge: 2023-07-04 | DRG: 389 | Disposition: A | Discharge status: Home / Self Care | Payer: Medicare Other | Attending: Internal Medicine | Admitting: Internal Medicine

## 2023-06-30 ENCOUNTER — Encounter (HOSPITAL_COMMUNITY): Payer: Self-pay

## 2023-06-30 DIAGNOSIS — Z91048 Other nonmedicinal substance allergy status: Secondary | ICD-10-CM

## 2023-06-30 DIAGNOSIS — K760 Fatty (change of) liver, not elsewhere classified: Secondary | ICD-10-CM

## 2023-06-30 DIAGNOSIS — K7460 Unspecified cirrhosis of liver: Secondary | ICD-10-CM | POA: Diagnosis present

## 2023-06-30 DIAGNOSIS — K56600 Partial intestinal obstruction, unspecified as to cause: Secondary | ICD-10-CM | POA: Diagnosis not present

## 2023-06-30 DIAGNOSIS — R8281 Pyuria: Secondary | ICD-10-CM | POA: Diagnosis present

## 2023-06-30 DIAGNOSIS — K56609 Unspecified intestinal obstruction, unspecified as to partial versus complete obstruction: Secondary | ICD-10-CM

## 2023-06-30 DIAGNOSIS — Z96611 Presence of right artificial shoulder joint: Secondary | ICD-10-CM | POA: Diagnosis present

## 2023-06-30 DIAGNOSIS — J45909 Unspecified asthma, uncomplicated: Secondary | ICD-10-CM | POA: Diagnosis present

## 2023-06-30 DIAGNOSIS — Z79899 Other long term (current) drug therapy: Secondary | ICD-10-CM

## 2023-06-30 DIAGNOSIS — K2980 Duodenitis without bleeding: Secondary | ICD-10-CM | POA: Diagnosis present

## 2023-06-30 DIAGNOSIS — Q2112 Patent foramen ovale: Secondary | ICD-10-CM

## 2023-06-30 DIAGNOSIS — M51369 Other intervertebral disc degeneration, lumbar region without mention of lumbar back pain or lower extremity pain: Secondary | ICD-10-CM | POA: Diagnosis present

## 2023-06-30 DIAGNOSIS — K2970 Gastritis, unspecified, without bleeding: Secondary | ICD-10-CM

## 2023-06-30 DIAGNOSIS — Z1152 Encounter for screening for COVID-19: Secondary | ICD-10-CM

## 2023-06-30 DIAGNOSIS — F32A Depression, unspecified: Secondary | ICD-10-CM | POA: Diagnosis present

## 2023-06-30 DIAGNOSIS — M109 Gout, unspecified: Secondary | ICD-10-CM | POA: Diagnosis present

## 2023-06-30 DIAGNOSIS — R1084 Generalized abdominal pain: Principal | ICD-10-CM | POA: Diagnosis present

## 2023-06-30 DIAGNOSIS — G4730 Sleep apnea, unspecified: Secondary | ICD-10-CM | POA: Diagnosis present

## 2023-06-30 DIAGNOSIS — I10 Essential (primary) hypertension: Secondary | ICD-10-CM | POA: Diagnosis present

## 2023-06-30 DIAGNOSIS — K439 Ventral hernia without obstruction or gangrene: Secondary | ICD-10-CM | POA: Diagnosis present

## 2023-06-30 DIAGNOSIS — E785 Hyperlipidemia, unspecified: Secondary | ICD-10-CM | POA: Diagnosis present

## 2023-06-30 DIAGNOSIS — I480 Paroxysmal atrial fibrillation: Secondary | ICD-10-CM | POA: Diagnosis present

## 2023-06-30 DIAGNOSIS — Z8542 Personal history of malignant neoplasm of other parts of uterus: Secondary | ICD-10-CM

## 2023-06-30 DIAGNOSIS — Z8719 Personal history of other diseases of the digestive system: Secondary | ICD-10-CM

## 2023-06-30 DIAGNOSIS — Z6841 Body Mass Index (BMI) 40.0 and over, adult: Secondary | ICD-10-CM

## 2023-06-30 DIAGNOSIS — R6510 Systemic inflammatory response syndrome (SIRS) of non-infectious origin without acute organ dysfunction: Secondary | ICD-10-CM

## 2023-06-30 DIAGNOSIS — K2990 Gastroduodenitis, unspecified, without bleeding: Secondary | ICD-10-CM | POA: Diagnosis present

## 2023-06-30 DIAGNOSIS — G4733 Obstructive sleep apnea (adult) (pediatric): Secondary | ICD-10-CM | POA: Diagnosis present

## 2023-06-30 DIAGNOSIS — Z881 Allergy status to other antibiotic agents status: Secondary | ICD-10-CM

## 2023-06-30 DIAGNOSIS — K920 Hematemesis: Secondary | ICD-10-CM

## 2023-06-30 DIAGNOSIS — Z9884 Bariatric surgery status: Secondary | ICD-10-CM

## 2023-06-30 DIAGNOSIS — E872 Acidosis, unspecified: Secondary | ICD-10-CM | POA: Diagnosis present

## 2023-06-30 DIAGNOSIS — Z8049 Family history of malignant neoplasm of other genital organs: Secondary | ICD-10-CM

## 2023-06-30 DIAGNOSIS — Z96652 Presence of left artificial knee joint: Secondary | ICD-10-CM | POA: Diagnosis present

## 2023-06-30 DIAGNOSIS — Z980 Intestinal bypass and anastomosis status: Secondary | ICD-10-CM

## 2023-06-30 DIAGNOSIS — Z9049 Acquired absence of other specified parts of digestive tract: Secondary | ICD-10-CM

## 2023-06-30 DIAGNOSIS — E119 Type 2 diabetes mellitus without complications: Secondary | ICD-10-CM

## 2023-06-30 DIAGNOSIS — R17 Unspecified jaundice: Secondary | ICD-10-CM

## 2023-06-30 DIAGNOSIS — Z888 Allergy status to other drugs, medicaments and biological substances status: Secondary | ICD-10-CM

## 2023-06-30 DIAGNOSIS — K3189 Other diseases of stomach and duodenum: Secondary | ICD-10-CM | POA: Diagnosis present

## 2023-06-30 DIAGNOSIS — Z9882 Breast implant status: Secondary | ICD-10-CM

## 2023-06-30 DIAGNOSIS — E1165 Type 2 diabetes mellitus with hyperglycemia: Secondary | ICD-10-CM | POA: Diagnosis present

## 2023-06-30 DIAGNOSIS — Z880 Allergy status to penicillin: Secondary | ICD-10-CM

## 2023-06-30 LAB — COMPREHENSIVE METABOLIC PANEL
ALT: 29 U/L (ref 0–44)
AST: 23 U/L (ref 15–41)
Albumin: 2.8 g/dL — ABNORMAL LOW (ref 3.5–5.0)
Alkaline Phosphatase: 50 U/L (ref 38–126)
Anion gap: 15 (ref 5–15)
BUN: 20 mg/dL (ref 8–23)
CO2: 23 mmol/L (ref 22–32)
Calcium: 9 mg/dL (ref 8.9–10.3)
Chloride: 99 mmol/L (ref 98–111)
Creatinine, Ser: 0.87 mg/dL (ref 0.44–1.00)
GFR, Estimated: >60 mL/min (ref >60)
Glucose, Bld: 197 mg/dL — ABNORMAL HIGH (ref 70–99)
Potassium: 4.1 mmol/L (ref 3.5–5.1)
Sodium: 137 mmol/L (ref 135–145)
Total Bilirubin: 1.5 mg/dL — ABNORMAL HIGH (ref <1.2)
Total Protein: 6.5 g/dL (ref 6.5–8.1)

## 2023-06-30 LAB — RESP PANEL BY RT-PCR (RSV, FLU A&B, COVID)  RVPGX2
Influenza A by PCR: NEGATIVE
Influenza B by PCR: NEGATIVE
Resp Syncytial Virus by PCR: NEGATIVE
SARS Coronavirus 2 by RT PCR: NEGATIVE

## 2023-06-30 LAB — CBC WITH DIFFERENTIAL/PLATELET
Abs Immature Granulocytes: 0 10*3/uL (ref 0.00–0.07)
Basophils Absolute: 0 10*3/uL (ref 0.0–0.1)
Basophils Relative: 0 %
Eosinophils Absolute: 0 10*3/uL (ref 0.0–0.5)
Eosinophils Relative: 0 %
HCT: 45.6 % (ref 36.0–46.0)
Hemoglobin: 15.1 g/dL — ABNORMAL HIGH (ref 12.0–15.0)
Lymphocytes Relative: 10 %
Lymphs Abs: 1.8 10*3/uL (ref 0.7–4.0)
MCH: 31.2 pg (ref 26.0–34.0)
MCHC: 33.1 g/dL (ref 30.0–36.0)
MCV: 94.2 fL (ref 80.0–100.0)
Monocytes Absolute: 0.7 10*3/uL (ref 0.1–1.0)
Monocytes Relative: 4 %
Neutro Abs: 15.5 10*3/uL — ABNORMAL HIGH (ref 1.7–7.7)
Neutrophils Relative %: 86 %
Platelets: 372 10*3/uL (ref 150–400)
RBC: 4.84 MIL/uL (ref 3.87–5.11)
RDW: 12.9 % (ref 11.5–15.5)
WBC: 18 10*3/uL — ABNORMAL HIGH (ref 4.0–10.5)
nRBC: 0 % (ref 0.0–0.2)
nRBC: 0 /100{WBCs}

## 2023-06-30 LAB — I-STAT CG4 LACTIC ACID, ED
Lactic Acid, Venous: 3.3 mmol/L (ref 0.5–1.9)
Lactic Acid, Venous: 2.1 mmol/L (ref 0.5–1.9)

## 2023-06-30 LAB — LIPASE, BLOOD: Lipase: 27 U/L (ref 11–51)

## 2023-06-30 LAB — POC OCCULT BLOOD, ED: Fecal Occult Bld: NEGATIVE

## 2023-06-30 MED ORDER — PIPERACILLIN-TAZOBACTAM 3.375 G IVPB 30 MIN
3.3750 g | Freq: Once | INTRAVENOUS | Status: AC
  Administered 2023-07-01: 3.375 g via INTRAVENOUS
  Filled 2023-06-30: qty 50

## 2023-06-30 MED ORDER — IOHEXOL 350 MG/ML SOLN
100.0000 mL | Freq: Once | INTRAVENOUS | Status: AC | PRN
  Administered 2023-06-30: 100 mL via INTRAVENOUS

## 2023-06-30 MED ORDER — ONDANSETRON HCL 4 MG/2ML IJ SOLN
4.0000 mg | Freq: Once | INTRAMUSCULAR | Status: AC
  Administered 2023-06-30: 4 mg via INTRAVENOUS
  Filled 2023-06-30: qty 2

## 2023-06-30 MED ORDER — SODIUM CHLORIDE 0.9 % IV BOLUS
1000.0000 mL | Freq: Once | INTRAVENOUS | Status: AC
  Administered 2023-06-30: 1000 mL via INTRAVENOUS

## 2023-06-30 MED ORDER — PANTOPRAZOLE SODIUM 40 MG IV SOLR
40.0000 mg | Freq: Once | INTRAVENOUS | Status: AC
  Administered 2023-07-01: 40 mg via INTRAVENOUS
  Filled 2023-06-30: qty 10

## 2023-06-30 MED ORDER — DIATRIZOATE MEGLUMINE & SODIUM 66-10 % PO SOLN
90.0000 mL | Freq: Once | ORAL | Status: AC
  Administered 2023-07-01: 90 mL via NASOGASTRIC
  Filled 2023-06-30: qty 90

## 2023-06-30 MED ORDER — MORPHINE SULFATE (PF) 4 MG/ML IV SOLN
4.0000 mg | Freq: Once | INTRAVENOUS | Status: AC
  Administered 2023-06-30: 4 mg via INTRAVENOUS
  Filled 2023-06-30: qty 1

## 2023-06-30 MED ORDER — FAMOTIDINE IN NACL 20-0.9 MG/50ML-% IV SOLN
20.0000 mg | Freq: Once | INTRAVENOUS | Status: AC
  Administered 2023-06-30: 20 mg via INTRAVENOUS
  Filled 2023-06-30: qty 50

## 2023-06-30 NOTE — ED Triage Notes (Signed)
Pt from home with ems c.o emesis x3 days and continued lower back pain. Pt had MRI on 12/1 that resulted in a herniated disc in L3&L4. Pt reports her vomit Is "Dr Danielle Stephenson colored" pt denies abd pain, only when she vomits.

## 2023-06-30 NOTE — Progress Notes (Signed)
ED Pharmacy Antibiotic Sign Off An antibiotic consult was received from an ED provider for zosyn per pharmacy dosing for sepsis. A chart review was completed to assess appropriateness.   Of note anaphylaxis reaction to penicillin in 20s which resulted in a hospitalization. She has tolerated cephalosporins and thinks she has had zosyn in the past with no issues.   The following one time order(s) were placed:  Zosyn 3.375g over 30 minutes  Further antibiotic and/or antibiotic pharmacy consults should be ordered by the admitting provider if indicated.   Thank you for allowing pharmacy to be a part of this patient's care.   Marja Kays, [facility]  Clinical Pharmacist 06/30/23 11:18 PM
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
## 2023-06-30 NOTE — ED Notes (Signed)
PT requested pain medication.  Note sent to provider.

## 2023-06-30 NOTE — ED Provider Notes (Signed)
[place] EMERGENCY DEPARTMENT AT [facility] Provider Note   CSN: [id number] Arrival date & time: 06/30/23  1826     History  Chief Complaint  Patient presents with   Back Pain   Emesis    Danielle Stephenson is a 77 y.o. female.   Back Pain Associated symptoms: abdominal pain   Emesis Associated symptoms: abdominal pain      77 year old female with medical history significant for morbid obesity, diverticulitis, hypertension, depression, gout, OSA on CPAP, DM2, liver cirrhosis, endometrial cancer, partial small bowel obstruction x 2 with history of colon surgery, history of gastric bypass surgery who presents to the emergency department with abdominal pain, nausea and vomiting, vomiting dark emesis.  The patient denies clear coffee ground emesis but states that her emesis has been very very dark.  She also endorses dark stools.  She is not passing gas.  She endorses generalized abdominal discomfort and has been vomiting for the past 3 days.  She continues to endorse back pain in the setting of her known herniated disc.  No bright red hematemesis.  Home Medications Prior to Admission medications   Medication Sig Start Date End Date Taking? Authorizing Provider  albuterol (VENTOLIN HFA) 108 (90 Base) MCG/ACT inhaler Inhale 1-2 puffs into the lungs every 4 (four) hours as needed for wheezing.     [provider]  amLODipine (NORVASC) 10 MG tablet Take 10 mg by mouth daily. 07/17/17   [provider]  carvedilol (COREG) 12.5 MG tablet Take 1 tablet by mouth in the morning and at bedtime.  10/15/19   [provider]  Cholecalciferol (VITAMIN D) 2000 UNITS tablet Take 2,000 Units by mouth daily.    [provider]  dexamethasone (DECADRON) 4 MG tablet Take 1 tablet (4 mg total) by mouth 2 (two) times daily with a meal. 06/16/23   Terrilee Files, MD  FLUoxetine (PROZAC) 20 MG capsule Take 20 mg by mouth daily. 04/07/19   [provider]   FLUoxetine (PROZAC) 40 MG capsule Take 40 mg by mouth daily. 01/08/22   [provider]  Fluticasone-Salmeterol (ADVAIR) 500-50 MCG/DOSE AEPB Inhale 1 puff into the lungs daily. Patient not taking: Reported on 01/30/2023    [provider]  hydrochlorothiazide (HYDRODIURIL) 25 MG tablet Take 25 mg by mouth daily. 11/10/19   [provider]  ketorolac (TORADOL) 10 MG tablet Take 1 tablet (10 mg total) by mouth every 6 (six) hours as needed. Maximum dose 40 mg in 24 hour period. 06/03/23   Elpidio Anis, PA-C  levonorgestrel (MIRENA) 20 MCG/24HR IUD 1 Intra Uterine Device (1 each total) by Intrauterine route once for 1 dose. To be placed in OR Patient taking differently: 1 each by Intrauterine route continuous. 06/05/19 06/05/19  Warner Mccreedy D, NP  losartan (COZAAR) 100 MG tablet Take 100 mg by mouth daily. 07/08/17   [provider]  methocarbamol (ROBAXIN) 500 MG tablet Take 2 tablets (1,000 mg total) by mouth 2 (two) times daily. 06/03/23   Elpidio Anis, PA-C  montelukast (SINGULAIR) 5 MG chewable tablet Chew 5 mg by mouth daily. 04/28/19   [provider]  Multiple Vitamins-Minerals (MULTIVITAMIN WITH MINERALS) tablet Take 1 tablet by mouth daily.    [provider]  oxyCODONE-acetaminophen (PERCOCET) 5-325 MG tablet Take 1 tablet by mouth every 6 (six) hours as needed. 06/16/23   Terrilee Files, MD  rosuvastatin (CRESTOR) 20 MG tablet Take 20 mg by mouth daily.    [provider]  VICTOZA 18 MG/3ML SOPN Inject 0.6 mg into the skin daily. 08/11/20   [provider]      Allergies    Penicillins, Ciprofloxacin, Iodine, Demerol [meperidine], and Tape    Review of Systems   Review of Systems  Gastrointestinal:  Positive for abdominal pain, nausea and vomiting.  Musculoskeletal:  Positive for back pain.  All other systems reviewed and are negative.   Physical Exam Updated Vital Signs BP (!) 157/128   Pulse 80    Temp 98.8 F (37.1 C) (Oral)   Resp (!) 23   SpO2 90%  Physical Exam Vitals and nursing note reviewed. Exam conducted with a chaperone present.  Constitutional:      General: She is not in acute distress.    Appearance: She is well-developed. She is obese.  HENT:     Head: Normocephalic and atraumatic.  Eyes:     Conjunctiva/sclera: Conjunctivae normal.  Cardiovascular:     Rate and Rhythm: Regular rhythm. Tachycardia present.  Pulmonary:     Effort: Pulmonary effort is normal. No respiratory distress.     Breath sounds: Normal breath sounds.  Abdominal:     Palpations: Abdomen is soft.     Tenderness: There is abdominal tenderness. There is no guarding or rebound.  Genitourinary:    Comments: No obvious melena or hematochezia, fecal occult negative Musculoskeletal:        General: No swelling.     Cervical back: Neck supple.  Skin:    General: Skin is warm and dry.     Capillary Refill: Capillary refill takes less than 2 seconds.  Neurological:     Mental Status: She is alert.  Psychiatric:        Mood and Affect: Mood normal.     ED Results / Procedures / Treatments   Labs (all labs ordered are listed, but only abnormal results are displayed) Labs Reviewed  CBC WITH DIFFERENTIAL/PLATELET - Abnormal; Notable for the following components:      Result Value   WBC 18.0 (*)    Hemoglobin 15.1 (*)    Neutro Abs 15.5 (*)    All other components within normal limits  COMPREHENSIVE METABOLIC PANEL - Abnormal; Notable for the following components:   Glucose, Bld 197 (*)    Albumin 2.8 (*)    Total Bilirubin 1.5 (*)    All other components within normal limits  I-STAT CG4 LACTIC ACID, ED - Abnormal; Notable for the following components:   Lactic Acid, Venous 3.3 (*)    All other components within normal limits  RESP PANEL BY RT-PCR (RSV, FLU A&B, COVID)  [id number]  LIPASE, BLOOD  POC OCCULT BLOOD, ED  I-STAT CG4 LACTIC ACID, ED    EKG EKG  Interpretation Date/Time:  Sunday June 30 2023 18:47:43 EST Ventricular Rate:  111 PR Interval:  133 QRS Duration:  133 QT Interval:  348 QTC Calculation: 473 R Axis:   -51  Text Interpretation: Sinus tachycardia Nonspecific IVCD with LAD LVH with secondary repolarization abnormality Confirmed by Ernie Avena ([id number]) on 06/30/2023 7:22:35 PM  Radiology DG Chest Portable 1 View Result Date: 06/30/2023 CLINICAL DATA:  Emesis. EXAM: PORTABLE CHEST 1 VIEW COMPARISON:  10/03/2019. FINDINGS: Cardiac silhouette appears prominent, even for AP view. No focal consolidation. No pneumothorax or pleural effusion. Aorta is calcified. IMPRESSION: Prominent cardiac silhouette. No acute pulmonary process. Electronically Signed   By: Layla Maw M.D.   On: 06/30/2023 20:19    Procedures .Ultrasound ED  Peripheral IV (Provider)  Date/Time: 06/30/2023 8:11 PM  Performed by: Ernie Avena, MD Authorized by: Ernie Avena, MD   Procedure details:    Indications: poor IV access     Location:  Right AC   Angiocath:  20 G   Bedside Ultrasound Guided: Yes     Images: not archived     Patient tolerated procedure without complications: Yes     Dressing applied: Yes       Medications Ordered in ED Medications  iohexol (OMNIPAQUE) 350 MG/ML injection 100 mL (has no administration in time range)  famotidine (PEPCID) IVPB 20 mg premix (0 mg Intravenous Stopped 06/30/23 1937)  ondansetron (ZOFRAN) injection 4 mg (4 mg Intravenous Given 06/30/23 1903)  sodium chloride 0.9 % bolus 1,000 mL (1,000 mLs Intravenous New Bag/Given 06/30/23 1902)  morphine (PF) 4 MG/ML injection 4 mg (4 mg Intravenous Given 06/30/23 1904)    ED Course/ Medical Decision Making/ A&P Clinical Course as of 06/30/23 2110  Sun Jun 30, 2023  2018 Fecal Occult Blood, POC: NEGATIVE [JL]  2018 Lactic Acid, Venous(!!): 3.3 [JL]  2018 WBC(!): 18.0 [JL]    Clinical Course User Index [JL] Ernie Avena, MD                                  Medical Decision Making Amount and/or Complexity of Data Reviewed Labs: ordered. Decision-making details documented in ED Course. Radiology: ordered.  Risk Prescription drug management.     77 year old female with medical history significant for morbid obesity, diverticulitis, hypertension, depression, gout, OSA on CPAP, DM2, liver cirrhosis, endometrial cancer, partial small bowel obstruction x 2 with history of colon surgery, history of gastric bypass surgery who presents to the emergency department with abdominal pain, nausea and vomiting, vomiting dark emesis.  The patient denies clear coffee ground emesis but states that her emesis has been very very dark.  She also endorses dark stools.  She is not passing gas.  She endorses generalized abdominal discomfort and has been vomiting for the past 3 days.  She continues to endorse back pain in the setting of her known herniated disc.  No bright red hematemesis.  On arrival, the patient was afebrile, temperature 98.8, tachycardic heart rate 101, tachypneic RR 22, BP 139/59, saturating 95% on room air.  Sinus tachycardia noted on cardiac telemetry.  Physical exam significant for generalized abdominal tenderness to palpation, morbidly obese female in no apparent distress.  Rectal exam revealed no gross melena or hematochezia.  Fecal occult negative.  IV access was obtained and the patient was administered IV Pepcid, IV morphine, IV Zofran and IV fluid bolus.  Ultrasound-guided IV placement was performed.  Laboratory evaluation was concerning for leukocytosis to 18, hemoglobin 15, lactic acid elevated to 3.3.  Concern for dehydration versus intra-abdominal catastrophe versus GI bleed versus partial or full SBO.  EKG revealed sinus tachycardia, rate 111, LVH, no acute ischemic changes.  A chest x-ray is performed which revealed prominent cardiac silhouette but no acute process.  CTA Abdomen Pelvis: pending at time of signout.  Patient  hemodynamically stable at time of signout, signout given to Dr. Andria Meuse at 2100.  Plan for potential admission however ultimate disposition pending results of diagnostic testing and reassessment.    Final Clinical Impression(s) / ED Diagnoses Final diagnoses:  None    Rx / DC Orders ED Discharge Orders     None  Ernie Avena, MD 06/30/23 2111

## 2023-06-30 NOTE — ED Provider Notes (Signed)
Received signout from green zone with Dr. Hart Rochester.  Dispo pending CT scan.  Reevaluated and continues to have some epigastric discomfort.  No further vomiting.  Hemodynamically stable.  Physical Exam  BP 120/68   Pulse 86   Temp 98.8 F (37.1 C) (Oral)   Resp 20   SpO2 94%   Physical Exam Vitals and nursing note reviewed.  HENT:     Head: Normocephalic.     Mouth/Throat:     Mouth: Mucous membranes are moist.  Cardiovascular:     Rate and Rhythm: Normal rate.  Pulmonary:     Effort: Pulmonary effort is normal.  Abdominal:     General: Abdomen is flat.     Tenderness: There is abdominal tenderness. There is no guarding or rebound.  Skin:    General: Skin is warm.     Capillary Refill: Capillary refill takes less than 2 seconds.  Neurological:     Mental Status: She is oriented to person, place, and time.  Psychiatric:        Mood and Affect: Mood normal.     Procedures  Procedures  ED Course / MDM   Clinical Course as of 07/01/23 0008  Sun Jun 30, 2023  2018 Fecal Occult Blood, POC: NEGATIVE [JL]  2018 Lactic Acid, Venous(!!): 3.3 [JL]  2018 WBC(!): 18.0 [JL]    Clinical Course User Index [JL] Ernie Avena, MD   Medical Decision Making 77 year old female with abdominal pain nausea vomiting.  Seen primarily by Dr. Hart Rochester green zone doctor.  Please's note for full HPI.  Patient continues to have some abdominal pain.  No further episodes of vomiting here.  Labs independently reviewed, did have elevated heart rate, tachypnea and leukocytosis with an elevated lactate.  Will get blood cultures and start Zosyn.  Appears on CTA streak artifact versus bleed.  However, hemoglobin is stable.  Discussed case with gastroenterology; recommending starting Protonix and keeping patient NPO.  Day team will evaluate patient.  Also on CT scan appears to possibly have SBO.  Spoke with general surgery, Dr. Vallery Ridge will see patient.  No acute surgical interventions planned at the moment.   Continue fluid resuscitation.  Given patient's overall clinical picture and lab findings will admit for further workup.  Amount and/or Complexity of Data Reviewed Labs: ordered. Decision-making details documented in ED Course. Radiology: ordered.  Risk Prescription drug management. Decision regarding hospitalization.         Coral Spikes, DO 06/30/23 2320    Coral Spikes, DO 07/01/23 Pernell Dupre

## 2023-07-01 ENCOUNTER — Inpatient Hospital Stay (HOSPITAL_COMMUNITY): Payer: Medicare Other

## 2023-07-01 DIAGNOSIS — K7460 Unspecified cirrhosis of liver: Secondary | ICD-10-CM | POA: Diagnosis present

## 2023-07-01 DIAGNOSIS — Q2112 Patent foramen ovale: Secondary | ICD-10-CM | POA: Diagnosis not present

## 2023-07-01 DIAGNOSIS — K920 Hematemesis: Secondary | ICD-10-CM

## 2023-07-01 DIAGNOSIS — R6510 Systemic inflammatory response syndrome (SIRS) of non-infectious origin without acute organ dysfunction: Secondary | ICD-10-CM | POA: Diagnosis present

## 2023-07-01 DIAGNOSIS — K2970 Gastritis, unspecified, without bleeding: Secondary | ICD-10-CM | POA: Diagnosis present

## 2023-07-01 DIAGNOSIS — Z1152 Encounter for screening for COVID-19: Secondary | ICD-10-CM | POA: Diagnosis not present

## 2023-07-01 DIAGNOSIS — I480 Paroxysmal atrial fibrillation: Secondary | ICD-10-CM | POA: Diagnosis present

## 2023-07-01 DIAGNOSIS — I4891 Unspecified atrial fibrillation: Secondary | ICD-10-CM | POA: Diagnosis not present

## 2023-07-01 DIAGNOSIS — K56600 Partial intestinal obstruction, unspecified as to cause: Secondary | ICD-10-CM | POA: Diagnosis present

## 2023-07-01 DIAGNOSIS — R1084 Generalized abdominal pain: Secondary | ICD-10-CM | POA: Diagnosis not present

## 2023-07-01 DIAGNOSIS — K760 Fatty (change of) liver, not elsewhere classified: Secondary | ICD-10-CM | POA: Diagnosis present

## 2023-07-01 DIAGNOSIS — E1165 Type 2 diabetes mellitus with hyperglycemia: Secondary | ICD-10-CM | POA: Diagnosis present

## 2023-07-01 DIAGNOSIS — R933 Abnormal findings on diagnostic imaging of other parts of digestive tract: Secondary | ICD-10-CM

## 2023-07-01 DIAGNOSIS — Z6841 Body Mass Index (BMI) 40.0 and over, adult: Secondary | ICD-10-CM | POA: Diagnosis not present

## 2023-07-01 DIAGNOSIS — K2980 Duodenitis without bleeding: Secondary | ICD-10-CM | POA: Diagnosis present

## 2023-07-01 DIAGNOSIS — K56609 Unspecified intestinal obstruction, unspecified as to partial versus complete obstruction: Secondary | ICD-10-CM

## 2023-07-01 DIAGNOSIS — K439 Ventral hernia without obstruction or gangrene: Secondary | ICD-10-CM | POA: Diagnosis present

## 2023-07-01 DIAGNOSIS — K469 Unspecified abdominal hernia without obstruction or gangrene: Secondary | ICD-10-CM

## 2023-07-01 DIAGNOSIS — F32A Depression, unspecified: Secondary | ICD-10-CM | POA: Diagnosis present

## 2023-07-01 DIAGNOSIS — E119 Type 2 diabetes mellitus without complications: Secondary | ICD-10-CM

## 2023-07-01 DIAGNOSIS — K3189 Other diseases of stomach and duodenum: Secondary | ICD-10-CM | POA: Diagnosis present

## 2023-07-01 DIAGNOSIS — R7989 Other specified abnormal findings of blood chemistry: Secondary | ICD-10-CM | POA: Diagnosis not present

## 2023-07-01 DIAGNOSIS — Z96611 Presence of right artificial shoulder joint: Secondary | ICD-10-CM | POA: Diagnosis present

## 2023-07-01 DIAGNOSIS — E872 Acidosis, unspecified: Secondary | ICD-10-CM | POA: Diagnosis present

## 2023-07-01 DIAGNOSIS — J45909 Unspecified asthma, uncomplicated: Secondary | ICD-10-CM | POA: Diagnosis present

## 2023-07-01 DIAGNOSIS — I10 Essential (primary) hypertension: Secondary | ICD-10-CM | POA: Diagnosis present

## 2023-07-01 DIAGNOSIS — Z96652 Presence of left artificial knee joint: Secondary | ICD-10-CM | POA: Diagnosis present

## 2023-07-01 DIAGNOSIS — G4733 Obstructive sleep apnea (adult) (pediatric): Secondary | ICD-10-CM | POA: Diagnosis present

## 2023-07-01 DIAGNOSIS — M51369 Other intervertebral disc degeneration, lumbar region without mention of lumbar back pain or lower extremity pain: Secondary | ICD-10-CM | POA: Diagnosis present

## 2023-07-01 DIAGNOSIS — K2990 Gastroduodenitis, unspecified, without bleeding: Secondary | ICD-10-CM | POA: Diagnosis present

## 2023-07-01 DIAGNOSIS — R17 Unspecified jaundice: Secondary | ICD-10-CM

## 2023-07-01 DIAGNOSIS — Z7985 Long-term (current) use of injectable non-insulin antidiabetic drugs: Secondary | ICD-10-CM

## 2023-07-01 DIAGNOSIS — R8281 Pyuria: Secondary | ICD-10-CM | POA: Diagnosis present

## 2023-07-01 LAB — URINALYSIS, ROUTINE W REFLEX MICROSCOPIC
Bilirubin Urine: NEGATIVE
Glucose, UA: NEGATIVE mg/dL
Ketones, ur: NEGATIVE mg/dL
Nitrite: NEGATIVE
Protein, ur: NEGATIVE mg/dL
Specific Gravity, Urine: >1.046 — ABNORMAL HIGH (ref 1.005–1.030)
pH: 5 (ref 5.0–8.0)

## 2023-07-01 LAB — HEMOGLOBIN A1C
Hgb A1c MFr Bld: 6.6 % — ABNORMAL HIGH (ref 4.8–5.6)
Mean Plasma Glucose: 142.72 mg/dL

## 2023-07-01 LAB — LACTIC ACID, PLASMA: Lactic Acid, Venous: 1.7 mmol/L (ref 0.5–1.9)

## 2023-07-01 LAB — COMPREHENSIVE METABOLIC PANEL
ALT: 33 U/L (ref 0–44)
AST: 26 U/L (ref 15–41)
Albumin: 2.7 g/dL — ABNORMAL LOW (ref 3.5–5.0)
Alkaline Phosphatase: 44 U/L (ref 38–126)
Anion gap: 11 (ref 5–15)
BUN: 18 mg/dL (ref 8–23)
CO2: 23 mmol/L (ref 22–32)
Calcium: 8.4 mg/dL — ABNORMAL LOW (ref 8.9–10.3)
Chloride: 103 mmol/L (ref 98–111)
Creatinine, Ser: 0.89 mg/dL (ref 0.44–1.00)
GFR, Estimated: >60 mL/min (ref >60)
Glucose, Bld: 153 mg/dL — ABNORMAL HIGH (ref 70–99)
Potassium: 3.9 mmol/L (ref 3.5–5.1)
Sodium: 137 mmol/L (ref 135–145)
Total Bilirubin: 1.4 mg/dL — ABNORMAL HIGH (ref <1.2)
Total Protein: 6 g/dL — ABNORMAL LOW (ref 6.5–8.1)

## 2023-07-01 LAB — BASIC METABOLIC PANEL
Anion gap: 5 (ref 5–15)
BUN: 17 mg/dL (ref 8–23)
CO2: 27 mmol/L (ref 22–32)
Calcium: 8 mg/dL — ABNORMAL LOW (ref 8.9–10.3)
Chloride: 105 mmol/L (ref 98–111)
Creatinine, Ser: 1.14 mg/dL — ABNORMAL HIGH (ref 0.44–1.00)
GFR, Estimated: 50 mL/min — ABNORMAL LOW (ref >60)
Glucose, Bld: 124 mg/dL — ABNORMAL HIGH (ref 70–99)
Potassium: 4 mmol/L (ref 3.5–5.1)
Sodium: 137 mmol/L (ref 135–145)

## 2023-07-01 LAB — CBC WITH DIFFERENTIAL/PLATELET
Abs Immature Granulocytes: 0.13 10*3/uL — ABNORMAL HIGH (ref 0.00–0.07)
Basophils Absolute: 0 10*3/uL (ref 0.0–0.1)
Basophils Relative: 0 %
Eosinophils Absolute: 0.1 10*3/uL (ref 0.0–0.5)
Eosinophils Relative: 1 %
HCT: 43.6 % (ref 36.0–46.0)
Hemoglobin: 14.5 g/dL (ref 12.0–15.0)
Immature Granulocytes: 1 %
Lymphocytes Relative: 13 %
Lymphs Abs: 2 10*3/uL (ref 0.7–4.0)
MCH: 31.7 pg (ref 26.0–34.0)
MCHC: 33.3 g/dL (ref 30.0–36.0)
MCV: 95.2 fL (ref 80.0–100.0)
Monocytes Absolute: 1.3 10*3/uL — ABNORMAL HIGH (ref 0.1–1.0)
Monocytes Relative: 9 %
Neutro Abs: 11.6 10*3/uL — ABNORMAL HIGH (ref 1.7–7.7)
Neutrophils Relative %: 76 %
Platelets: 297 10*3/uL (ref 150–400)
RBC: 4.58 MIL/uL (ref 3.87–5.11)
RDW: 13.2 % (ref 11.5–15.5)
WBC: 15.1 10*3/uL — ABNORMAL HIGH (ref 4.0–10.5)
nRBC: 0 % (ref 0.0–0.2)

## 2023-07-01 LAB — CBG MONITORING, ED: Glucose-Capillary: 161 mg/dL — ABNORMAL HIGH (ref 70–99)

## 2023-07-01 LAB — CBC
HCT: 39.8 % (ref 36.0–46.0)
Hemoglobin: 12.9 g/dL (ref 12.0–15.0)
MCH: 31.3 pg (ref 26.0–34.0)
MCHC: 32.4 g/dL (ref 30.0–36.0)
MCV: 96.6 fL (ref 80.0–100.0)
Platelets: 285 10*3/uL (ref 150–400)
RBC: 4.12 MIL/uL (ref 3.87–5.11)
RDW: 13 % (ref 11.5–15.5)
WBC: 12.9 10*3/uL — ABNORMAL HIGH (ref 4.0–10.5)
nRBC: 0 % (ref 0.0–0.2)

## 2023-07-01 LAB — GLUCOSE, CAPILLARY
Glucose-Capillary: 134 mg/dL — ABNORMAL HIGH (ref 70–99)
Glucose-Capillary: 107 mg/dL — ABNORMAL HIGH (ref 70–99)
Glucose-Capillary: 108 mg/dL — ABNORMAL HIGH (ref 70–99)
Glucose-Capillary: 119 mg/dL — ABNORMAL HIGH (ref 70–99)
Glucose-Capillary: 138 mg/dL — ABNORMAL HIGH (ref 70–99)

## 2023-07-01 LAB — MAGNESIUM
Magnesium: 2.3 mg/dL (ref 1.7–2.4)
Magnesium: 2.3 mg/dL (ref 1.7–2.4)

## 2023-07-01 LAB — PHOSPHORUS: Phosphorus: 3.4 mg/dL (ref 2.5–4.6)

## 2023-07-01 MED ORDER — DILTIAZEM HCL-DEXTROSE 125-5 MG/125ML-% IV SOLN (PREMIX)
5.0000 mg/h | INTRAVENOUS | Status: DC
Start: 2023-07-01 — End: 2023-07-03
  Administered 2023-07-01: 5 mg/h via INTRAVENOUS
  Administered 2023-07-01: 10 mg/h via INTRAVENOUS
  Administered 2023-07-02: 15 mg/h via INTRAVENOUS
  Administered 2023-07-02: 20 mg/h via INTRAVENOUS
  Administered 2023-07-02: 15 mg/h via INTRAVENOUS
  Administered 2023-07-02: 20 mg/h via INTRAVENOUS
  Administered 2023-07-03: 17.5 mg/h via INTRAVENOUS
  Filled 2023-07-01 (×6): qty 125

## 2023-07-01 MED ORDER — PIPERACILLIN-TAZOBACTAM 3.375 G IVPB
3.3750 g | Freq: Three times a day (TID) | INTRAVENOUS | Status: DC
  Administered 2023-07-01: 3.375 g via INTRAVENOUS
  Filled 2023-07-01: qty 50

## 2023-07-01 MED ORDER — DILTIAZEM LOAD VIA INFUSION
20.0000 mg | Freq: Once | INTRAVENOUS | Status: AC
  Administered 2023-07-01: 20 mg via INTRAVENOUS
  Filled 2023-07-01: qty 20

## 2023-07-01 MED ORDER — SODIUM CHLORIDE 0.9 % IV SOLN
INTRAVENOUS | Status: AC
Start: 2023-07-01 — End: 2023-07-02

## 2023-07-01 MED ORDER — MORPHINE SULFATE (PF) 4 MG/ML IV SOLN
4.0000 mg | INTRAVENOUS | Status: DC | PRN
  Administered 2023-07-01 – 2023-07-04 (×11): 4 mg via INTRAVENOUS
  Filled 2023-07-01 (×11): qty 1

## 2023-07-01 MED ORDER — HYDROMORPHONE HCL 1 MG/ML IJ SOLN
0.5000 mg | INTRAMUSCULAR | Status: AC
  Administered 2023-07-01: 0.5 mg via INTRAVENOUS
  Filled 2023-07-01: qty 1

## 2023-07-01 MED ORDER — INSULIN ASPART 100 UNIT/ML IJ SOLN
0.0000 [IU] | INTRAMUSCULAR | Status: DC
Start: 2023-07-01 — End: 2023-07-03
  Administered 2023-07-01: 1 [IU] via SUBCUTANEOUS
  Administered 2023-07-01 – 2023-07-02 (×2): 2 [IU] via SUBCUTANEOUS
  Administered 2023-07-02 – 2023-07-03 (×7): 1 [IU] via SUBCUTANEOUS

## 2023-07-01 MED ORDER — PIPERACILLIN-TAZOBACTAM 3.375 G IVPB 30 MIN: 3.3750 g | Freq: Three times a day (TID) | INTRAVENOUS | Status: DC

## 2023-07-01 MED ORDER — HYDROMORPHONE HCL 1 MG/ML IJ SOLN
0.5000 mg | INTRAMUSCULAR | Status: DC | PRN
  Administered 2023-07-01: 0.5 mg via INTRAVENOUS
  Filled 2023-07-01: qty 1

## 2023-07-01 MED ORDER — DILTIAZEM HCL 30 MG PO TABS: 60.0000 mg | ORAL_TABLET | Freq: Four times a day (QID) | ORAL | Status: DC

## 2023-07-01 MED ORDER — DILTIAZEM HCL 25 MG/5ML IV SOLN: 10.0000 mg | Freq: Four times a day (QID) | INTRAVENOUS | Status: DC | PRN

## 2023-07-01 MED ORDER — PROCHLORPERAZINE EDISYLATE 10 MG/2ML IJ SOLN: 5.0000 mg | Freq: Four times a day (QID) | INTRAMUSCULAR | Status: DC | PRN

## 2023-07-01 MED ORDER — PANTOPRAZOLE SODIUM 40 MG IV SOLR: 40.0000 mg | Freq: Two times a day (BID) | INTRAVENOUS | Status: DC

## 2023-07-01 MED ORDER — PANTOPRAZOLE SODIUM 40 MG IV SOLR
40.0000 mg | Freq: Two times a day (BID) | INTRAVENOUS | Status: DC
  Administered 2023-07-01 – 2023-07-02 (×4): 40 mg via INTRAVENOUS
  Filled 2023-07-01 (×4): qty 10

## 2023-07-01 NOTE — Progress Notes (Signed)
[facility] PROGRESS NOTE    Progress Note  Danielle Stephenson  WUJ:[id number] DOB: Nov 07, 1945 DOA: 06/30/2023 PCP: Stevphen Rochester, MD     Brief Narrative:   Danielle Stephenson is an 77 y.o. female past medical history significant for severe morbid obesity, essential hypertension obstructive sleep apnea on CPAP, hepatic steatosis endometrial cancer with a history of bariatric surgery, history of diverticulitis that required surgical intervention with wound dehiscence leading to a partial hemicolectomy, last colonoscopy in 2020 comes into the ED for complaints of abdominal pain associated with nausea and vomiting for 3 days prior to admission.  She also endorses coffee-ground emesis.  CT angio showed no evidence of bleeding, but did showed small bowel obstruction about 4 cm postanastomosis    Assessment/Plan:   Generalized abdominal pain due to SBO: NG tube in place, hemoglobin is 15. General Surgery was consulted may allow ice chips. Hold DVT prophylaxis. Small bowel protocol started. [place] potassium greater than 4 magnesium greater than 2.   SIRS: With a white count of 18, lactic acid of 3.3. No signs of infection on UA or chest x-ray. Was given IV Zosyn in the ED and fluid resuscitated. This is likely due to SBO. Repeated white blood cell count is improved she has remained afebrile. Started on IV fluids her lactic acid is trending down. Discontinue antibiotic has remained afebrile there is likely reactive due to a small bowel obstruction.  Reported coffee-ground emesis: Started on Protonix IV twice daily IV antiemetics. Blood pressure stable continue to monitor hemoglobin closely. Change Protonix to 1 today. Hemoglobin is stable.  Sleep apnea with use of continuous positive airway pressure (CPAP) Continue CPAP at night.  Type 2 diabetes mellitus without complication, without long-term current use of insulin (HCC) A1c is pending.  Currently n.p.o. started on sliding  scale insulin.  Hepatic steatosis/Elevated bilirubin Isolated bilirubin repeat in the morning.  Morbid obesity with BMI of 50.0-59.9, adult (HCC) Counseling.   DVT prophylaxis: scd Family Communication:none Status is: Inpatient Remains inpatient appropriate because: Small bowel obstruction    Code Status:     Code Status Orders  (From admission, onward)           Start     Ordered   07/01/23 0434  Full code  Continuous       Question:  By:  Answer:  Consent: discussion documented in EHR   07/01/23 0433           Code Status History     Date Active Date Inactive Code Status Order ID Comments User Context   08/20/2019 0532 08/20/2019 1543 Full Code [id number]  Doylene Bode, NP Inpatient   05/18/2019 0631 05/18/2019 1340 Full Code [id number]  Sherian Rein, MD Inpatient   08/29/2017 1401 08/30/2017 1605 Full Code [id number]  Jiles Harold, PA-C Inpatient         IV Access:   Peripheral IV   Procedures and diagnostic studies:   DG Abd Portable 1V-Small Bowel Protocol-Position Verification Result Date: 07/01/2023 CLINICAL DATA:  NG tube placement EXAM: PORTABLE ABDOMEN - 1 VIEW COMPARISON:  None Available. FINDINGS: Enteric tube tip in the stomach. The side port is not definitively seen but is likely in the stomach. Postoperative change about the gastroesophageal junction. IMPRESSION: Enteric tube tip in the stomach. Electronically Signed   By: Minerva Fester M.D.   On: 07/01/2023 01:36   CT ANGIO GI BLEED Result Date: 06/30/2023 CLINICAL DATA:  Lower back pain. Dr. Reino Kent colored vomitus. Abdominal pain.  Best possible images as patient was unable to be positioned to include the bowel in the ventral hernia. EXAM: CTA ABDOMEN AND PELVIS WITHOUT AND WITH CONTRAST TECHNIQUE: Multidetector CT imaging of the abdomen and pelvis was performed using the standard protocol during bolus administration of intravenous contrast. Multiplanar reconstructed images and  MIPs were obtained and reviewed to evaluate the vascular anatomy. RADIATION DOSE REDUCTION: This exam was performed according to the departmental dose-optimization program which includes automated exposure control, adjustment of the mA and/or kV according to patient size and/or use of iterative reconstruction technique. CONTRAST:  OMNIPAQUE IOHEXOL 350 MG/ML SOLN COMPARISON:  CT abdomen and pelvis 04/17/2019 FINDINGS: VASCULAR Scattered calcified atherosclerotic plaque the aorta and its mesenteric, renal, and iliac artery branches without severe stenosis. No aortic aneurysm or dissection. Review of the MIP images confirms the above findings. NON-VASCULAR Lower chest: No acute abnormality. Hepatobiliary: Cholecystectomy.  No acute abnormality. Pancreas: Unremarkable. Spleen: Unremarkable. Adrenals/Urinary Tract: Normal adrenal glands. No urinary calculi or hydronephrosis. Unremarkable bladder. Stomach/Bowel: Postoperative changes about the GE junction. Small focus of linear arterial hyperenhancement near the gastroesophageal junction on series 6/image 30 is not visualized on noncontrast or portal venous phase images. There is extensive streak artifact in this area. Differential considerations include small focus of active bleeding, arterially enhancing small-vessel, or artifact. Consider correlation with endoscopy. Postoperative changes small bowel resection with multiple small bowel anastomoses. The small bowel about the most proximal anastomosis is dilated measuring up to 4.5 cm. Possible transition point distal to this anastomosis (circa series 8/image 174). Normal caliber colon. No colonic wall thickening. Colonic diverticulosis without diverticulitis. The partially visualized herniation of the transverse colon into the left ventral abdominal wall hernia. 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 No  evidence of strangulation or obstruction. Lymphatic: No lymphadenopathy. Reproductive: IUD in the uterus.  No adnexal mass. Other: No free intraperitoneal fluid or air. Musculoskeletal: No acute fracture. IMPRESSION: 1. Small focus of linear arterial hyperenhancement near the gastroesophageal junction is not visualized on noncontrast or portal venous phase images. There is extensive streak artifact in this area. Differential considerations include small focus of active bleeding, a small artery, or artifact. Consider correlation with endoscopy. 2. Postoperative changes of small bowel resection with multiple small bowel anastomoses. The small bowel about the most proximal anastomosis is dilated measuring up to 4.5 cm. Possible transition point distal to this anastomosis. Findings are concerning for small bowel obstruction. 3. Partially visualized herniation of the transverse colon into the left ventral abdominal wall hernia. Additional partial herniation of outer border of the transverse colon through a ventral abdominal wall defect (Richter's hernia). No evidence of strangulation or obstruction in the visualized portions of the colon. Aortic Atherosclerosis (ICD10-I70.0). Electronically Signed   By: Minerva Fester M.D.   On: 06/30/2023 22:15   DG Chest Portable 1 View Result Date: 06/30/2023 CLINICAL DATA:  Emesis. EXAM: PORTABLE CHEST 1 VIEW COMPARISON:  10/03/2019. FINDINGS: Cardiac silhouette appears prominent, even for AP view. No focal consolidation. No pneumothorax or pleural effusion. Aorta is calcified. IMPRESSION: Prominent cardiac silhouette. No acute pulmonary process. Electronically Signed   By: Layla Maw M.D.   On: 06/30/2023 20:19     Medical Consultants:   None.   Subjective:    [name] She relates she is thirsty, her abdominal pain is resolved.  She says she passed gas this morning  Objective:    Vitals:  07/01/23 0430 07/01/23 0500 07/01/23 0630 07/01/23 0637  BP:  (!) 81/17  120/62   Pulse: 88  85 98  Resp: 14     Temp:    98 F (36.7 C)  TempSrc:    Oral  SpO2: 92%  94% 94%  Weight:  (!) 136.1 kg     SpO2: 94 %   Intake/Output Summary (Last 24 hours) at 07/01/2023 0713 Last data filed at 07/01/2023 0245 Gross per 24 hour  Intake --  Output 30 ml  Net -30 ml   Filed Weights   07/01/23 0500  Weight: (!) 136.1 kg    Exam: General exam: In no acute distress. Respiratory system: Good air movement and clear to auscultation. Cardiovascular system: S1 & S2 heard, RRR. No JVD. Gastrointestinal system: Bowel sound soft nontender nondistended, abdominal hernias nontender. Extremities: No pedal edema. Skin: No rashes, lesions or ulcers Psychiatry: Judgement and insight appear normal. Mood & affect appropriate.    Data Reviewed:    Labs: Basic Metabolic Panel: Recent Labs  Lab 06/30/23 1905  NA 137  K 4.1  CL 99  CO2 23  GLUCOSE 197*  BUN 20  CREATININE 0.87  CALCIUM 9.0   GFR Estimated Creatinine Clearance: 79.4 mL/min (by C-G formula based on SCr of 0.87 mg/dL). Liver Function Tests: Recent Labs  Lab 06/30/23 1905  AST 23  ALT 29  ALKPHOS 50  BILITOT 1.5*  PROT 6.5  ALBUMIN 2.8*   Recent Labs  Lab 06/30/23 1905  LIPASE 27   No results for input(s): "AMMONIA" in the last 168 hours. Coagulation profile No results for input(s): "INR", "PROTIME" in the last 168 hours. COVID-19 Labs  No results for input(s): "DDIMER", "FERRITIN", "LDH", "CRP" in the last 72 hours.  Lab Results  Component Value Date   SARSCOV2NAA NEGATIVE 06/30/2023   SARSCOV2NAA NEGATIVE 10/02/2020   SARSCOV2NAA NEGATIVE 08/17/2019   SARSCOV2NAA NOT DETECTED 05/15/2019    CBC: Recent Labs  Lab 06/30/23 1905 07/01/23 0644  WBC 18.0* 15.1*  NEUTROABS 15.5* 11.6*  HGB 15.1* 14.5  HCT 45.6 43.6  MCV 94.2 95.2  PLT 372 297   Cardiac Enzymes: No results for input(s): "CKTOTAL", "CKMB", "CKMBINDEX", "TROPONINI" in the last 168  hours. BNP (last 3 results) No results for input(s): "PROBNP" in the last 8760 hours. CBG: Recent Labs  Lab 07/01/23 0601  GLUCAP 161*   D-Dimer: No results for input(s): "DDIMER" in the last 72 hours. Hgb A1c: No results for input(s): "HGBA1C" in the last 72 hours. Lipid Profile: No results for input(s): "CHOL", "HDL", "LDLCALC", "TRIG", "CHOLHDL", "LDLDIRECT" in the last 72 hours. Thyroid function studies: No results for input(s): "TSH", "T4TOTAL", "T3FREE", "THYROIDAB" in the last 72 hours.  Invalid input(s): "FREET3" Anemia work up: No results for input(s): "VITAMINB12", "FOLATE", "FERRITIN", "TIBC", "IRON", "RETICCTPCT" in the last 72 hours. Sepsis Labs: Recent Labs  Lab 06/30/23 1905 06/30/23 1922 06/30/23 2154 07/01/23 0644  WBC 18.0*  --   --  15.1*  LATICACIDVEN  --  3.3* 2.1*  --    Microbiology Recent Results (from the past 240 hours)  Resp panel by RT-PCR (RSV, Flu A&B, Covid) Anterior Nasal Swab     Status: None   Collection Time: 06/30/23  6:38 PM   Specimen: Anterior Nasal Swab  Result Value Ref Range Status   SARS Coronavirus 2 by RT PCR NEGATIVE NEGATIVE Final   Influenza A by PCR NEGATIVE NEGATIVE Final   Influenza B by PCR NEGATIVE NEGATIVE Final  Comment: (NOTE) The Xpert Xpress SARS-CoV-2/FLU/RSV plus assay is intended as an aid in the diagnosis of influenza from Nasopharyngeal swab specimens and should not be used as a sole basis for treatment. Nasal washings and aspirates are unacceptable for Xpert Xpress SARS-CoV-2/FLU/RSV testing.  Fact Sheet for Patients: BloggerCourse.com  Fact Sheet for Healthcare Providers: SeriousBroker.it  This test is not yet approved or cleared by the [place] FDA and has been authorized for detection and/or diagnosis of SARS-CoV-2 by FDA under an Emergency Use Authorization (EUA). This EUA will remain in effect (meaning this test can be used) for the  duration of the COVID-19 declaration under Section 564(b)(1) of the Act, 21 U.S.C. section 360bbb-3(b)(1), unless the authorization is terminated or revoked.     Resp Syncytial Virus by PCR NEGATIVE NEGATIVE Final    Comment: (NOTE) Fact Sheet for Patients: BloggerCourse.com  Fact Sheet for Healthcare Providers: SeriousBroker.it  This test is not yet approved or cleared by the [place] FDA and has been authorized for detection and/or diagnosis of SARS-CoV-2 by FDA under an Emergency Use Authorization (EUA). This EUA will remain in effect (meaning this test can be used) for the duration of the COVID-19 declaration under Section 564(b)(1) of the Act, 21 U.S.C. section 360bbb-3(b)(1), unless the authorization is terminated or revoked.  Performed at [facility], [address] [place], [place] [address]      Medications:    insulin aspart  0-9 Units Subcutaneous Q4H   pantoprazole (PROTONIX) IV  40 mg Intravenous BID   Continuous Infusions:  sodium chloride 100 mL/hr at 07/01/23 0634   piperacillin-tazobactam (ZOSYN)  IV 3.375 g (07/01/23 0619)      LOS: 0 days   Marinda Elk  [facility]  07/01/2023, 7:13 AM

## 2023-07-01 NOTE — Plan of Care (Signed)

## 2023-07-01 NOTE — Progress Notes (Signed)
   07/01/23 2039  BiPAP/CPAP/SIPAP  BiPAP/CPAP/SIPAP Pt Type Adult  BiPAP/CPAP/SIPAP Resmed  BiPAP/CPAP /SiPAP Vitals  Pulse Rate (!) 122  SpO2 96 %  Bilateral Breath Sounds Clear;Diminished   Pt stated she does use CPAP at home.  Pt also stated she has been in so much pain/has NG tube in place that its ok to hold off on CPAP for the night
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
## 2023-07-01 NOTE — Progress Notes (Signed)
Pt transferred to 3E-11. Pt called daughter to let her know that she was being transferred to another floor for higher level of care.

## 2023-07-01 NOTE — Progress Notes (Addendum)
A fib seen on telemetry. 12 lead ecg showed A fib with RVR. Pt is currently asymptomatic; no CP or SOB, she denies palpitation. Bp is stable. - Start IV diltiazem for a target HR < 100. Currently NPO do to SBO. - She is not a candidate for anticoagulation due to possible GI bleed. - Cont to monitor Hbg. - Strict I's and O's. - check a 2-d Echo. - For EGD in am.

## 2023-07-02 ENCOUNTER — Other Ambulatory Visit (HOSPITAL_COMMUNITY): Payer: Medicare Other

## 2023-07-02 ENCOUNTER — Inpatient Hospital Stay (HOSPITAL_COMMUNITY): Payer: Medicare Other

## 2023-07-02 ENCOUNTER — Other Ambulatory Visit: Payer: Medicare Other

## 2023-07-02 DIAGNOSIS — Z6841 Body Mass Index (BMI) 40.0 and over, adult: Secondary | ICD-10-CM

## 2023-07-02 DIAGNOSIS — I4891 Unspecified atrial fibrillation: Secondary | ICD-10-CM

## 2023-07-02 DIAGNOSIS — K56609 Unspecified intestinal obstruction, unspecified as to partial versus complete obstruction: Secondary | ICD-10-CM | POA: Diagnosis not present

## 2023-07-02 DIAGNOSIS — R1084 Generalized abdominal pain: Secondary | ICD-10-CM | POA: Diagnosis not present

## 2023-07-02 DIAGNOSIS — K920 Hematemesis: Secondary | ICD-10-CM | POA: Diagnosis not present

## 2023-07-02 LAB — CBC
HCT: 41.7 % (ref 36.0–46.0)
HCT: 43.5 % (ref 36.0–46.0)
Hemoglobin: 13.7 g/dL (ref 12.0–15.0)
Hemoglobin: 14.1 g/dL (ref 12.0–15.0)
MCH: 30.8 pg (ref 26.0–34.0)
MCH: 31.6 pg (ref 26.0–34.0)
MCHC: 32.4 g/dL (ref 30.0–36.0)
MCHC: 32.9 g/dL (ref 30.0–36.0)
MCV: 95 fL (ref 80.0–100.0)
MCV: 96.3 fL (ref 80.0–100.0)
Platelets: 295 10*3/uL (ref 150–400)
Platelets: 313 10*3/uL (ref 150–400)
RBC: 4.33 MIL/uL (ref 3.87–5.11)
RBC: 4.58 MIL/uL (ref 3.87–5.11)
RDW: 13.1 % (ref 11.5–15.5)
RDW: 13.1 % (ref 11.5–15.5)
WBC: 11.8 10*3/uL — ABNORMAL HIGH (ref 4.0–10.5)
WBC: 12.2 10*3/uL — ABNORMAL HIGH (ref 4.0–10.5)
nRBC: 0 % (ref 0.0–0.2)
nRBC: 0 % (ref 0.0–0.2)

## 2023-07-02 LAB — GLUCOSE, CAPILLARY
Glucose-Capillary: 142 mg/dL — ABNORMAL HIGH (ref 70–99)
Glucose-Capillary: 128 mg/dL — ABNORMAL HIGH (ref 70–99)
Glucose-Capillary: 128 mg/dL — ABNORMAL HIGH (ref 70–99)
Glucose-Capillary: 168 mg/dL — ABNORMAL HIGH (ref 70–99)
Glucose-Capillary: 139 mg/dL — ABNORMAL HIGH (ref 70–99)

## 2023-07-02 LAB — BASIC METABOLIC PANEL
Anion gap: 10 (ref 5–15)
BUN: 11 mg/dL (ref 8–23)
CO2: 22 mmol/L (ref 22–32)
Calcium: 7.8 mg/dL — ABNORMAL LOW (ref 8.9–10.3)
Chloride: 107 mmol/L (ref 98–111)
Creatinine, Ser: 0.86 mg/dL (ref 0.44–1.00)
GFR, Estimated: >60 mL/min (ref >60)
Glucose, Bld: 144 mg/dL — ABNORMAL HIGH (ref 70–99)
Potassium: 3.8 mmol/L (ref 3.5–5.1)
Sodium: 139 mmol/L (ref 135–145)

## 2023-07-02 MED ORDER — METOPROLOL TARTRATE 5 MG/5ML IV SOLN: 5.0000 mg | Freq: Four times a day (QID) | INTRAVENOUS | Status: DC | PRN

## 2023-07-02 NOTE — Progress Notes (Signed)
IV Team to bedside per consult for infiltrated IV. IV still running cardizem on assessment. IV access intact, patent, and with blood return. Pt refusing new IV access, this RN advised not needed at this time. Spoke with floor RN Davina Poke.

## 2023-07-02 NOTE — Progress Notes (Signed)
NGT remains low int suction with minimal output. Pt denies nausea. Passing gas and multiple small BMs overnight, no signs of bleeding  Remains on Cardizem gtt for Afib. Rates 100-130 when awake; 90s when asleep.  Bps stable  8/10 pain in back and hip, PRN Morphine q4

## 2023-07-02 NOTE — Progress Notes (Signed)
Mobility Specialist Progress Note:   07/02/23 1030  Mobility  Activity Ambulated with assistance in hallway  Level of Assistance Contact guard assist, steadying assist  Assistive Device Other (Comment) (IV Pole)  Distance Ambulated (ft) 110 ft  Activity Response Tolerated well  Mobility Referral Yes  Mobility visit 1 Mobility  Mobility Specialist Start Time (ACUTE ONLY) 1030  Mobility Specialist Stop Time (ACUTE ONLY) 1053  Mobility Specialist Time Calculation (min) (ACUTE ONLY) 23 min   Pt agreeable to mobility session. Required contact guard assist for safety with IV pole. HR elevated to 130s during ambulation. Left sitting EOB with all needs met, alarm on.  Addison Lank Mobility Specialist Please contact via SecureChat or  Rehab office at [phone]

## 2023-07-02 NOTE — H&P (View-Only) (Signed)
Progress Note   Assessment    77 year old female with a history of morbid obesity, hypertension, sleep apnea, fatty liver, prior endometrial cancer, history of bariatric surgery, prior small and large bowel resections with history of diverticulitis admitted with abdominal pain nausea vomiting and coffee-ground emesis with hospitalization complicated by new onset atrial fibrillation with RVR  Principal Problem:   Generalized abdominal pain Active Problems:   Sleep apnea with use of continuous positive airway pressure (CPAP)   Morbid obesity with BMI of 50.0-59.9, adult (HCC)   Type 2 diabetes mellitus without complication, without long-term current use of insulin (HCC)   SBO (small bowel obstruction) (HCC)   SIRS (systemic inflammatory response syndrome) (HCC)   Coffee ground emesis   Hepatic steatosis   Elevated bilirubin   Recommendations   1.  Coffee-ground emesis/positive CTA --very likely Mallory-Weiss tear now resolved; no further melena or vomiting -- EGD canceled for today due to A-fib with rapid ventricular response -- Twice daily IV PPI for now -- Eventual EGD when appropriate from a cardiac standpoint  2.  SBO/abdominal pain --clinically improved, brief NG tube decompression without significant output.  This appears to have resolved -- Advance diet -- Eventual EGD as above  3.  New A-fib with RVR --rate not yet controlled; defer to hospitalist and possibly cardiology consult  Chief Complaint   Persistent rapid atrial fibrillation; despite maximum Cardizem infusion Patient transferred to 3 E. 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 September No further nausea or vomiting Hungry Bowel movement this morning light brown  Vital signs in last 24 hours: Temp:  [97.9 F (36.6 C)-98.8 F (37.1 C)] 98 F (36.7 C) (12/17  1110) Pulse Rate:  [88-134] 109 (12/17 1110) Resp:  [13-21] 20 (12/17 1110) BP: (103-154)/(59-108) 132/87 (12/17 1110) SpO2:  [93 %-98 %] 95 % (12/17 1110) Weight:  [148.7 kg] 148.7 kg (12/17 0416) Last BM Date : 07/01/23 Gen: awake, alert, NAD HEENT: anicteric  CV: Tachycardic and irregular Pulm: CTA b/l Abd: soft, obese, NT/ND, +BS throughout Ext: no c/c/e Neuro: nonfocal   Intake/Output from previous day: 12/16 0701 - 12/17 0700 In: 204.2 [I.V.:204.2] Out: -  Intake/Output this shift: No intake/output data recorded.  Lab Results: Recent Labs    07/01/23 0644 07/01/23 0857 07/01/23 2348  WBC 15.1* 12.9* 12.2*  HGB 14.5 12.9 13.7  HCT 43.6 39.8 41.7  PLT 297 285 295   BMET Recent Labs    07/01/23 0644 07/01/23 0857 07/02/23 0249  NA 137 137 139  K 3.9 4.0 3.8  CL 103 105 107  CO2 23 27 22   GLUCOSE 153* 124* 144*  BUN 18 17 11   CREATININE 0.89 1.14* 0.86  CALCIUM 8.4* 8.0* 7.8*   LFT Recent Labs    07/01/23 0644  PROT 6.0*  ALBUMIN 2.7*  AST 26  ALT 33  ALKPHOS 44  BILITOT 1.4*     Studies/Results: DG Abd Portable 1V Result Date: 07/02/2023 CLINICAL DATA:  Small-bowel obstruction EXAM: PORTABLE ABDOMEN - 1 VIEW COMPARISON:  X-ray 07/01/2023 and older. FINDINGS: Markedly limited study as films are under penetrated. Enteric tube overlying the body of the stomach. Surgical clips in the upper abdomen. Persistent dilated loops of bowel throughout the midabdomen with some contrast seen along colon. On these very limited images the amount of contrast appears decreased from previous. The  right hemi abdominal edge is clipped off the edge of the film as well. IMPRESSION: Extremely limited evaluation. Contrast again seen along colon. Persistent dilated loops of bowel in the midabdomen. Please correlate with history and additional evaluation as clinically appropriate Electronically Signed   By: Karen Kays M.D.   On: 07/02/2023 10:19   DG Abd Portable 1V-Small  Bowel Obstruction Protocol-initial, 8 hr delay Result Date: 07/01/2023 CLINICAL DATA:  Assessment for small bowel obstruction, 8 hours after administration of contrast medium. EXAM: PORTABLE ABDOMEN - 1 VIEW COMPARISON:  07/01/2023 1:52 a.m. FINDINGS: Nasogastric tube tip in the stomach body. Dilute contrast medium appears to be in the colon and rectum. Mildly dilated loops of small bowel up to 4.1 cm in diameter. Thoracolumbar spondylosis.  Degenerative arthropathy of both hips. IMPRESSION: 1. Dilute contrast medium appears to be in the colon and rectum. 2. Mildly dilated loops of small bowel up to 4.1 cm in diameter. 3. Nasogastric tube tip in the stomach body. Electronically Signed   By: Gaylyn Rong M.D.   On: 07/01/2023 11:00   DG Abd Portable 1V-Small Bowel Protocol-Position Verification Result Date: 07/01/2023 CLINICAL DATA:  NG tube placement EXAM: PORTABLE ABDOMEN - 1 VIEW COMPARISON:  None Available. FINDINGS: Enteric tube tip in the stomach. The side port is not definitively seen but is likely in the stomach. Postoperative change about the gastroesophageal junction. IMPRESSION: Enteric tube tip in the stomach. Electronically Signed   By: Minerva Fester M.D.   On: 07/01/2023 01:36   CT ANGIO GI BLEED Result Date: 06/30/2023 CLINICAL DATA:  Lower back pain. Dr. Reino Kent colored vomitus. Abdominal pain. Best possible images as patient was unable to be positioned to include the bowel in the ventral hernia. EXAM: CTA ABDOMEN AND PELVIS WITHOUT AND WITH CONTRAST TECHNIQUE: Multidetector CT imaging of the abdomen and pelvis was performed using the standard protocol during bolus administration of intravenous contrast. Multiplanar reconstructed images and MIPs were obtained and reviewed to evaluate the vascular anatomy. RADIATION DOSE REDUCTION: This exam was performed according to the departmental dose-optimization program which includes automated exposure control, adjustment of the mA and/or kV  according to patient size and/or use of iterative reconstruction technique. CONTRAST:  OMNIPAQUE IOHEXOL 350 MG/ML SOLN COMPARISON:  CT abdomen and pelvis 04/17/2019 FINDINGS: VASCULAR Scattered calcified atherosclerotic plaque the aorta and its mesenteric, renal, and iliac artery branches without severe stenosis. No aortic aneurysm or dissection. Review of the MIP images confirms the above findings. NON-VASCULAR Lower chest: No acute abnormality. Hepatobiliary: Cholecystectomy.  No acute abnormality. Pancreas: Unremarkable. Spleen: Unremarkable. Adrenals/Urinary Tract: Normal adrenal glands. No urinary calculi or hydronephrosis. Unremarkable bladder. Stomach/Bowel: Postoperative changes about the GE junction. Small focus of linear arterial hyperenhancement near the gastroesophageal junction on series 6/image 30 is not visualized on noncontrast or portal venous phase images. There is extensive streak artifact in this area. Differential considerations include small focus of active bleeding, arterially enhancing small-vessel, or artifact. Consider correlation with endoscopy. Postoperative changes small bowel resection with multiple small bowel anastomoses. The small bowel about the most proximal anastomosis is dilated measuring up to 4.5 cm. Possible transition point distal to this anastomosis (circa series 8/image 174). Normal caliber colon. No colonic wall thickening. Colonic diverticulosis without diverticulitis. The partially visualized herniation of the transverse colon into the left ventral abdominal wall hernia. 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 No evidence of strangulation or obstruction. Lymphatic: No  lymphadenopathy. Reproductive: IUD in the uterus.  No adnexal mass. Other: No free intraperitoneal fluid or air. Musculoskeletal: No acute fracture. IMPRESSION: 1. Small focus of linear  arterial hyperenhancement near the gastroesophageal junction is not visualized on noncontrast or portal venous phase images. There is extensive streak artifact in this area. Differential considerations include small focus of active bleeding, a small artery, or artifact. Consider correlation with endoscopy. 2. Postoperative changes of small bowel resection with multiple small bowel anastomoses. The small bowel about the most proximal anastomosis is dilated measuring up to 4.5 cm. Possible transition point distal to this anastomosis. Findings are concerning for small bowel obstruction. 3. Partially visualized herniation of the transverse colon into the left ventral abdominal wall hernia. Additional partial herniation of outer border of the transverse colon through a ventral abdominal wall defect (Richter's hernia). No evidence of strangulation or obstruction in the visualized portions of the colon. Aortic Atherosclerosis (ICD10-I70.0). Electronically Signed   By: Minerva Fester M.D.   On: 06/30/2023 22:15   DG Chest Portable 1 View Result Date: 06/30/2023 CLINICAL DATA:  Emesis. EXAM: PORTABLE CHEST 1 VIEW COMPARISON:  10/03/2019. FINDINGS: Cardiac silhouette appears prominent, even for AP view. No focal consolidation. No pneumothorax or pleural effusion. Aorta is calcified. IMPRESSION: Prominent cardiac silhouette. No acute pulmonary process. Electronically Signed   By: Layla Maw M.D.   On: 06/30/2023 20:19      LOS: 1 day   Beverley Fiedler, MD 07/02/2023, 11:35 AM See Loretha Stapler, [facility], to contact our on call provider
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
## 2023-07-02 NOTE — Progress Notes (Signed)
Progress Note   Assessment    77 year old female with a history of morbid obesity, hypertension, sleep apnea, fatty liver, prior endometrial cancer, history of bariatric surgery, prior small and large bowel resections with history of diverticulitis admitted with abdominal pain nausea vomiting and coffee-ground emesis with hospitalization complicated by new onset atrial fibrillation with RVR  Principal Problem:   Generalized abdominal pain Active Problems:   Sleep apnea with use of continuous positive airway pressure (CPAP)   Morbid obesity with BMI of 50.0-59.9, adult (HCC)   Type 2 diabetes mellitus without complication, without long-term current use of insulin (HCC)   SBO (small bowel obstruction) (HCC)   SIRS (systemic inflammatory response syndrome) (HCC)   Coffee ground emesis   Hepatic steatosis   Elevated bilirubin   Recommendations   1.  Coffee-ground emesis/positive CTA --very likely Mallory-Weiss tear now resolved; no further melena or vomiting -- EGD canceled for today due to A-fib with rapid ventricular response -- Twice daily IV PPI for now -- Eventual EGD when appropriate from a cardiac standpoint  2.  SBO/abdominal pain --clinically improved, brief NG tube decompression without significant output.  This appears to have resolved -- Advance diet -- Eventual EGD as above  3.  New A-fib with RVR --rate not yet controlled; defer to hospitalist and possibly cardiology consult  Chief Complaint   Persistent rapid atrial fibrillation; despite maximum Cardizem infusion Patient transferred to 3 E. 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 September No further nausea or vomiting Hungry Bowel movement this morning light brown  Vital signs in last 24 hours: Temp:  [97.9 F (36.6 C)-98.8 F (37.1 C)] 98 F (36.7 C) (12/17  1110) Pulse Rate:  [88-134] 109 (12/17 1110) Resp:  [13-21] 20 (12/17 1110) BP: (103-154)/(59-108) 132/87 (12/17 1110) SpO2:  [93 %-98 %] 95 % (12/17 1110) Weight:  [148.7 kg] 148.7 kg (12/17 0416) Last BM Date : 07/01/23 Gen: awake, alert, NAD HEENT: anicteric  CV: Tachycardic and irregular Pulm: CTA b/l Abd: soft, obese, NT/ND, +BS throughout Ext: no c/c/e Neuro: nonfocal   Intake/Output from previous day: 12/16 0701 - 12/17 0700 In: 204.2 [I.V.:204.2] Out: -  Intake/Output this shift: No intake/output data recorded.  Lab Results: Recent Labs    07/01/23 0644 07/01/23 0857 07/01/23 2348  WBC 15.1* 12.9* 12.2*  HGB 14.5 12.9 13.7  HCT 43.6 39.8 41.7  PLT 297 285 295   BMET Recent Labs    07/01/23 0644 07/01/23 0857 07/02/23 0249  NA 137 137 139  K 3.9 4.0 3.8  CL 103 105 107  CO2 23 27 22   GLUCOSE 153* 124* 144*  BUN 18 17 11   CREATININE 0.89 1.14* 0.86  CALCIUM 8.4* 8.0* 7.8*   LFT Recent Labs    07/01/23 0644  PROT 6.0*  ALBUMIN 2.7*  AST 26  ALT 33  ALKPHOS 44  BILITOT 1.4*     Studies/Results: DG Abd Portable 1V Result Date: 07/02/2023 CLINICAL DATA:  Small-bowel obstruction EXAM: PORTABLE ABDOMEN - 1 VIEW COMPARISON:  X-ray 07/01/2023 and older. FINDINGS: Markedly limited study as films are under penetrated. Enteric tube overlying the body of the stomach. Surgical clips in the upper abdomen. Persistent dilated loops of bowel throughout the midabdomen with some contrast seen along colon. On these very limited images the amount of contrast appears decreased from previous. The  right hemi abdominal edge is clipped off the edge of the film as well. IMPRESSION: Extremely limited evaluation. Contrast again seen along colon. Persistent dilated loops of bowel in the midabdomen. Please correlate with history and additional evaluation as clinically appropriate Electronically Signed   By: Karen Kays M.D.   On: 07/02/2023 10:19   DG Abd Portable 1V-Small  Bowel Obstruction Protocol-initial, 8 hr delay Result Date: 07/01/2023 CLINICAL DATA:  Assessment for small bowel obstruction, 8 hours after administration of contrast medium. EXAM: PORTABLE ABDOMEN - 1 VIEW COMPARISON:  07/01/2023 1:52 a.m. FINDINGS: Nasogastric tube tip in the stomach body. Dilute contrast medium appears to be in the colon and rectum. Mildly dilated loops of small bowel up to 4.1 cm in diameter. Thoracolumbar spondylosis.  Degenerative arthropathy of both hips. IMPRESSION: 1. Dilute contrast medium appears to be in the colon and rectum. 2. Mildly dilated loops of small bowel up to 4.1 cm in diameter. 3. Nasogastric tube tip in the stomach body. Electronically Signed   By: Gaylyn Rong M.D.   On: 07/01/2023 11:00   DG Abd Portable 1V-Small Bowel Protocol-Position Verification Result Date: 07/01/2023 CLINICAL DATA:  NG tube placement EXAM: PORTABLE ABDOMEN - 1 VIEW COMPARISON:  None Available. FINDINGS: Enteric tube tip in the stomach. The side port is not definitively seen but is likely in the stomach. Postoperative change about the gastroesophageal junction. IMPRESSION: Enteric tube tip in the stomach. Electronically Signed   By: Minerva Fester M.D.   On: 07/01/2023 01:36   CT ANGIO GI BLEED Result Date: 06/30/2023 CLINICAL DATA:  Lower back pain. Dr. Reino Kent colored vomitus. Abdominal pain. Best possible images as patient was unable to be positioned to include the bowel in the ventral hernia. EXAM: CTA ABDOMEN AND PELVIS WITHOUT AND WITH CONTRAST TECHNIQUE: Multidetector CT imaging of the abdomen and pelvis was performed using the standard protocol during bolus administration of intravenous contrast. Multiplanar reconstructed images and MIPs were obtained and reviewed to evaluate the vascular anatomy. RADIATION DOSE REDUCTION: This exam was performed according to the departmental dose-optimization program which includes automated exposure control, adjustment of the mA and/or kV  according to patient size and/or use of iterative reconstruction technique. CONTRAST:  OMNIPAQUE IOHEXOL 350 MG/ML SOLN COMPARISON:  CT abdomen and pelvis 04/17/2019 FINDINGS: VASCULAR Scattered calcified atherosclerotic plaque the aorta and its mesenteric, renal, and iliac artery branches without severe stenosis. No aortic aneurysm or dissection. Review of the MIP images confirms the above findings. NON-VASCULAR Lower chest: No acute abnormality. Hepatobiliary: Cholecystectomy.  No acute abnormality. Pancreas: Unremarkable. Spleen: Unremarkable. Adrenals/Urinary Tract: Normal adrenal glands. No urinary calculi or hydronephrosis. Unremarkable bladder. Stomach/Bowel: Postoperative changes about the GE junction. Small focus of linear arterial hyperenhancement near the gastroesophageal junction on series 6/image 30 is not visualized on noncontrast or portal venous phase images. There is extensive streak artifact in this area. Differential considerations include small focus of active bleeding, arterially enhancing small-vessel, or artifact. Consider correlation with endoscopy. Postoperative changes small bowel resection with multiple small bowel anastomoses. The small bowel about the most proximal anastomosis is dilated measuring up to 4.5 cm. Possible transition point distal to this anastomosis (circa series 8/image 174). Normal caliber colon. No colonic wall thickening. Colonic diverticulosis without diverticulitis. The partially visualized herniation of the transverse colon into the left ventral abdominal wall hernia. 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 No evidence of strangulation or obstruction. Lymphatic: No  lymphadenopathy. Reproductive: IUD in the uterus.  No adnexal mass. Other: No free intraperitoneal fluid or air. Musculoskeletal: No acute fracture. IMPRESSION: 1. Small focus of linear  arterial hyperenhancement near the gastroesophageal junction is not visualized on noncontrast or portal venous phase images. There is extensive streak artifact in this area. Differential considerations include small focus of active bleeding, a small artery, or artifact. Consider correlation with endoscopy. 2. Postoperative changes of small bowel resection with multiple small bowel anastomoses. The small bowel about the most proximal anastomosis is dilated measuring up to 4.5 cm. Possible transition point distal to this anastomosis. Findings are concerning for small bowel obstruction. 3. Partially visualized herniation of the transverse colon into the left ventral abdominal wall hernia. Additional partial herniation of outer border of the transverse colon through a ventral abdominal wall defect (Richter's hernia). No evidence of strangulation or obstruction in the visualized portions of the colon. Aortic Atherosclerosis (ICD10-I70.0). Electronically Signed   By: Minerva Fester M.D.   On: 06/30/2023 22:15   DG Chest Portable 1 View Result Date: 06/30/2023 CLINICAL DATA:  Emesis. EXAM: PORTABLE CHEST 1 VIEW COMPARISON:  10/03/2019. FINDINGS: Cardiac silhouette appears prominent, even for AP view. No focal consolidation. No pneumothorax or pleural effusion. Aorta is calcified. IMPRESSION: Prominent cardiac silhouette. No acute pulmonary process. Electronically Signed   By: Layla Maw M.D.   On: 06/30/2023 20:19      LOS: 1 day   Beverley Fiedler, MD 07/02/2023, 11:35 AM See Loretha Stapler, [facility], to contact our on call provider

## 2023-07-02 NOTE — Progress Notes (Signed)
Tel call from ENDO, RN stated that a per GI HR not stable enough and case will be postponed until tomorrow.

## 2023-07-02 NOTE — Progress Notes (Signed)
[facility] PROGRESS NOTE    Progress Note  Danielle Stephenson  RJJ:[id number] DOB: [birth year] DOA: 06/30/2023 PCP: Stevphen Rochester, MD     Brief Narrative:   Danielle Stephenson is an 77 y.o. female past medical history significant for severe morbid obesity, essential hypertension obstructive sleep apnea on CPAP, hepatic steatosis endometrial cancer with a history of bariatric surgery, history of diverticulitis that required surgical intervention with wound dehiscence leading to a partial hemicolectomy, last colonoscopy in 2020 comes into the ED for complaints of abdominal pain associated with nausea and vomiting for 3 days prior to admission.  She also endorses coffee-ground emesis.  CT angio showed no evidence of bleeding, but did showed small bowel obstruction about 4 cm postanastomosis  Assessment/Plan:   Generalized abdominal pain due to SBO: NG tube in place, hemoglobin is 15. Started on the small bowel protocol contrast is in the colon. She is passing gas has had a bowel movement Further management per general surgery, continue to hold DVT prophylaxis. Try to keep potassium greater than 4 magnesium greater than 2.  SIRS: With a white count of 18, lactic acid of 3.3. No signs of infection on UA or chest x-ray. Only received 1 dose of Zosyn in the ED. This is likely due to SBO. Repeated white blood cell count is improved she has remained afebrile. Continue IV fluids as she is currently NPO. Tmax 98.6.  Reported coffee-ground emesis: Started on Protonix IV twice daily IV antiemetics. Hemoglobin is stable at 13. GI was consulted recommended possible upper endoscopy for suspicious of Mallory-Weiss tear.  New onset A-fib with RVR: Not a candidate for anticoagulation at this point in time due to questionable upper GI bleed. Started on IV diltiazem and heart rate has been improved. Increase diltiazem to 20 and IV metoprolol for heart rate greater than 100. Blood pressure is  tolerating it.  Sleep apnea with use of continuous positive airway pressure (CPAP) Continue CPAP at night.  Type 2 diabetes mellitus without complication, without long-term current use of insulin (HCC) A1c is 6.6 currently n.p.o. started on sliding scale insulin.  Hepatic steatosis/Elevated bilirubin Isolated bilirubin repeat in the morning.  Morbid obesity with BMI of 50.0-59.9, adult (HCC) Counseling.   DVT prophylaxis: scd Family Communication:none Status is: Inpatient Remains inpatient appropriate because: Small bowel obstruction    Code Status:     Code Status Orders  (From admission, onward)           Start     Ordered   07/01/23 0434  Full code  Continuous       Question:  By:  Answer:  Consent: discussion documented in EHR   07/01/23 0433           Code Status History     Date Active Date Inactive Code Status Order ID Comments User Context   08/20/2019 0532 08/20/2019 1543 Full Code [id number]  Doylene Bode, NP Inpatient   05/18/2019 0631 05/18/2019 1340 Full Code [id number]  Sherian Rein, MD Inpatient   08/29/2017 1401 08/30/2017 1605 Full Code [id number]  Jiles Harold, PA-C Inpatient         IV Access:   Peripheral IV   Procedures and diagnostic studies:   DG Abd Portable 1V-Small Bowel Obstruction Protocol-initial, 8 hr delay Result Date: 07/01/2023 CLINICAL DATA:  Assessment for small bowel obstruction, 8 hours after administration of contrast medium. EXAM: PORTABLE ABDOMEN - 1 VIEW COMPARISON:  07/01/2023 1:52 a.m. FINDINGS: Nasogastric tube tip in the stomach  body. Dilute contrast medium appears to be in the colon and rectum. Mildly dilated loops of small bowel up to 4.1 cm in diameter. Thoracolumbar spondylosis.  Degenerative arthropathy of both hips. IMPRESSION: 1. Dilute contrast medium appears to be in the colon and rectum. 2. Mildly dilated loops of small bowel up to 4.1 cm in diameter. 3. Nasogastric tube tip in the stomach  body. Electronically Signed   By: Gaylyn Rong M.D.   On: 07/01/2023 11:00   DG Abd Portable 1V-Small Bowel Protocol-Position Verification Result Date: 07/01/2023 CLINICAL DATA:  NG tube placement EXAM: PORTABLE ABDOMEN - 1 VIEW COMPARISON:  None Available. FINDINGS: Enteric tube tip in the stomach. The side port is not definitively seen but is likely in the stomach. Postoperative change about the gastroesophageal junction. IMPRESSION: Enteric tube tip in the stomach. Electronically Signed   By: Minerva Fester M.D.   On: 07/01/2023 01:36   CT ANGIO GI BLEED Result Date: 06/30/2023 CLINICAL DATA:  Lower back pain. Dr. Reino Kent colored vomitus. Abdominal pain. Best possible images as patient was unable to be positioned to include the bowel in the ventral hernia. EXAM: CTA ABDOMEN AND PELVIS WITHOUT AND WITH CONTRAST TECHNIQUE: Multidetector CT imaging of the abdomen and pelvis was performed using the standard protocol during bolus administration of intravenous contrast. Multiplanar reconstructed images and MIPs were obtained and reviewed to evaluate the vascular anatomy. RADIATION DOSE REDUCTION: This exam was performed according to the departmental dose-optimization program which includes automated exposure control, adjustment of the mA and/or kV according to patient size and/or use of iterative reconstruction technique. CONTRAST:  OMNIPAQUE IOHEXOL 350 MG/ML SOLN COMPARISON:  CT abdomen and pelvis 04/17/2019 FINDINGS: VASCULAR Scattered calcified atherosclerotic plaque the aorta and its mesenteric, renal, and iliac artery branches without severe stenosis. No aortic aneurysm or dissection. Review of the MIP images confirms the above findings. NON-VASCULAR Lower chest: No acute abnormality. Hepatobiliary: Cholecystectomy.  No acute abnormality. Pancreas: Unremarkable. Spleen: Unremarkable. Adrenals/Urinary Tract: Normal adrenal glands. No urinary calculi or hydronephrosis. Unremarkable bladder.  Stomach/Bowel: Postoperative changes about the GE junction. Small focus of linear arterial hyperenhancement near the gastroesophageal junction on series 6/image 30 is not visualized on noncontrast or portal venous phase images. There is extensive streak artifact in this area. Differential considerations include small focus of active bleeding, arterially enhancing small-vessel, or artifact. Consider correlation with endoscopy. Postoperative changes small bowel resection with multiple small bowel anastomoses. The small bowel about the most proximal anastomosis is dilated measuring up to 4.5 cm. Possible transition point distal to this anastomosis (circa series 8/image 174). Normal caliber colon. No colonic wall thickening. Colonic diverticulosis without diverticulitis. The partially visualized herniation of the transverse colon into the left ventral abdominal wall hernia. 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 No evidence of strangulation or obstruction. Lymphatic: No lymphadenopathy. Reproductive: IUD in the uterus.  No adnexal mass. Other: No free intraperitoneal fluid or air. Musculoskeletal: No acute fracture. IMPRESSION: 1. Small focus of linear arterial hyperenhancement near the gastroesophageal junction is not visualized on noncontrast or portal venous phase images. There is extensive streak artifact in this area. Differential considerations include small focus of active bleeding, a small artery, or artifact. Consider correlation with endoscopy. 2. Postoperative changes of small bowel resection with multiple small bowel anastomoses. The small bowel about the most proximal anastomosis is dilated measuring up to 4.5 cm. Possible transition point distal to this anastomosis. Findings  are concerning for small bowel obstruction. 3. Partially visualized herniation of the transverse colon into the left ventral  abdominal wall hernia. Additional partial herniation of outer border of the transverse colon through a ventral abdominal wall defect (Richter's hernia). No evidence of strangulation or obstruction in the visualized portions of the colon. Aortic Atherosclerosis (ICD10-I70.0). Electronically Signed   By: Minerva Fester M.D.   On: 06/30/2023 22:15   DG Chest Portable 1 View Result Date: 06/30/2023 CLINICAL DATA:  Emesis. EXAM: PORTABLE CHEST 1 VIEW COMPARISON:  10/03/2019. FINDINGS: Cardiac silhouette appears prominent, even for AP view. No focal consolidation. No pneumothorax or pleural effusion. Aorta is calcified. IMPRESSION: Prominent cardiac silhouette. No acute pulmonary process. Electronically Signed   By: Layla Maw M.D.   On: 06/30/2023 20:19     Medical Consultants:   None.   Subjective:    Danielle Stephenson she relates her abdominal pain is resolved she feels much better.  Objective:    Vitals:   07/01/23 2314 07/01/23 2332 07/02/23 0129 07/02/23 0416  BP: (!) 106/90   (!) 154/61  Pulse: (!) 122   (!) 108  Resp: 20 18 19 20   Temp: 97.9 F (36.6 C)   97.9 F (36.6 C)  TempSrc: Oral   Oral  SpO2: 93%   94%  Weight:    (!) 148.7 kg  Height:       SpO2: 94 %   Intake/Output Summary (Last 24 hours) at 07/02/2023 0738 Last data filed at 07/01/2023 1604 Gross per 24 hour  Intake 204.2 ml  Output --  Net 204.2 ml   Filed Weights   07/01/23 0500 07/01/23 1133 07/02/23 0416  Weight: (!) 136.1 kg (!) 136.5 kg (!) 148.7 kg    Exam: General exam: In no acute distress. Respiratory system: Good air movement and clear to auscultation. Cardiovascular system: S1 & S2 heard, RRR. No JVD. Gastrointestinal system: Positive sounds soft nontender nondistended cannot appreciate a hernia this morning. Extremities: No pedal edema. Skin: No rashes, lesions or ulcers Psychiatry: Judgement and insight appear normal. Mood & affect appropriate.  Data Reviewed:    Labs: Basic  Metabolic Panel: Recent Labs  Lab 06/30/23 1905 07/01/23 0644 07/01/23 0857 07/02/23 0249  NA 137 137 137 139  K 4.1 3.9 4.0 3.8  CL 99 103 105 107  CO2 23 23 27 22   GLUCOSE 197* 153* 124* 144*  BUN 20 18 17 11   CREATININE 0.87 0.89 1.14* 0.86  CALCIUM 9.0 8.4* 8.0* 7.8*  MG  --  2.3 2.3  --   PHOS  --  3.4  --   --    GFR Estimated Creatinine Clearance: 83.6 mL/min (by C-G formula based on SCr of 0.86 mg/dL). Liver Function Tests: Recent Labs  Lab 06/30/23 1905 07/01/23 0644  AST 23 26  ALT 29 33  ALKPHOS 50 44  BILITOT 1.5* 1.4*  PROT 6.5 6.0*  ALBUMIN 2.8* 2.7*   Recent Labs  Lab 06/30/23 1905  LIPASE 27   No results for input(s): "AMMONIA" in the last 168 hours. Coagulation profile No results for input(s): "INR", "PROTIME" in the last 168 hours. COVID-19 Labs  No results for input(s): "DDIMER", "FERRITIN", "LDH", "CRP" in the last 72 hours.  Lab Results  Component Value Date   SARSCOV2NAA NEGATIVE 06/30/2023   SARSCOV2NAA NEGATIVE 10/02/2020   SARSCOV2NAA NEGATIVE 08/17/2019   SARSCOV2NAA NOT DETECTED 05/15/2019    CBC: Recent Labs  Lab 06/30/23 1905 07/01/23 0644 07/01/23 0857 07/01/23 2348  WBC 18.0* 15.1* 12.9* 12.2*  NEUTROABS 15.5* 11.6*  --   --   HGB 15.1* 14.5 12.9 13.7  HCT 45.6 43.6 39.8 41.7  MCV 94.2 95.2 96.6 96.3  PLT 372 297 285 295   Cardiac Enzymes: No results for input(s): "CKTOTAL", "CKMB", "CKMBINDEX", "TROPONINI" in the last 168 hours. BNP (last 3 results) No results for input(s): "PROBNP" in the last 8760 hours. CBG: Recent Labs  Lab 07/01/23 1634 07/01/23 2025 07/01/23 2317 07/02/23 0421 07/02/23 0724  GLUCAP 108* 119* 138* 142* 128*   D-Dimer: No results for input(s): "DDIMER" in the last 72 hours. Hgb A1c: Recent Labs    07/01/23 0644  HGBA1C 6.6*   Lipid Profile: No results for input(s): "CHOL", "HDL", "LDLCALC", "TRIG", "CHOLHDL", "LDLDIRECT" in the last 72 hours. Thyroid function studies: No  results for input(s): "TSH", "T4TOTAL", "T3FREE", "THYROIDAB" in the last 72 hours.  Invalid input(s): "FREET3" Anemia work up: No results for input(s): "VITAMINB12", "FOLATE", "FERRITIN", "TIBC", "IRON", "RETICCTPCT" in the last 72 hours. Sepsis Labs: Recent Labs  Lab 06/30/23 1905 06/30/23 1922 06/30/23 2154 07/01/23 0536 07/01/23 0644 07/01/23 0857 07/01/23 2348  WBC 18.0*  --   --   --  15.1* 12.9* 12.2*  LATICACIDVEN  --  3.3* 2.1* 1.7  --   --   --    Microbiology Recent Results (from the past 240 hours)  Resp panel by RT-PCR (RSV, Flu A&B, Covid) Anterior Nasal Swab     Status: None   Collection Time: 06/30/23  6:38 PM   Specimen: Anterior Nasal Swab  Result Value Ref Range Status   SARS Coronavirus 2 by RT PCR NEGATIVE NEGATIVE Final   Influenza A by PCR NEGATIVE NEGATIVE Final   Influenza B by PCR NEGATIVE NEGATIVE Final    Comment: (NOTE) The Xpert Xpress SARS-CoV-2/FLU/RSV plus assay is intended as an aid in the diagnosis of influenza from Nasopharyngeal swab specimens and should not be used as a sole basis for treatment. Nasal washings and aspirates are unacceptable for Xpert Xpress SARS-CoV-2/FLU/RSV testing.  Fact Sheet for Patients: BloggerCourse.com  Fact Sheet for Healthcare Providers: SeriousBroker.it  This test is not yet approved or cleared by the [place] FDA and has been authorized for detection and/or diagnosis of SARS-CoV-2 by FDA under an Emergency Use Authorization (EUA). This EUA will remain in effect (meaning this test can be used) for the duration of the COVID-19 declaration under Section 564(b)(1) of the Act, 21 U.S.C. section 360bbb-3(b)(1), unless the authorization is terminated or revoked.     Resp Syncytial Virus by PCR NEGATIVE NEGATIVE Final    Comment: (NOTE) Fact Sheet for Patients: BloggerCourse.com  Fact Sheet for Healthcare  Providers: SeriousBroker.it  This test is not yet approved or cleared by the [place] FDA and has been authorized for detection and/or diagnosis of SARS-CoV-2 by FDA under an Emergency Use Authorization (EUA). This EUA will remain in effect (meaning this test can be used) for the duration of the COVID-19 declaration under Section 564(b)(1) of the Act, 21 U.S.C. section 360bbb-3(b)(1), unless the authorization is terminated or revoked.  Performed at [facility], [address] [place], [place] [address]      Medications:    insulin aspart  0-9 Units Subcutaneous Q4H   pantoprazole (PROTONIX) IV  40 mg Intravenous BID   Continuous Infusions:  diltiazem (CARDIZEM) infusion 15 mg/hr (07/02/23 0505)      LOS: 1 day   Marinda Elk  [facility]  07/02/2023,  7:38 AM

## 2023-07-03 ENCOUNTER — Inpatient Hospital Stay (HOSPITAL_COMMUNITY): Payer: Medicare Other

## 2023-07-03 ENCOUNTER — Encounter (HOSPITAL_COMMUNITY)
Admission: EM | Disposition: A | Discharge status: Home / Self Care | Payer: Self-pay | Source: Home / Self Care | Attending: Internal Medicine

## 2023-07-03 ENCOUNTER — Inpatient Hospital Stay (HOSPITAL_COMMUNITY): Payer: Medicare Other | Admitting: Registered Nurse

## 2023-07-03 ENCOUNTER — Encounter (HOSPITAL_COMMUNITY): Payer: Self-pay | Admitting: Internal Medicine

## 2023-07-03 DIAGNOSIS — K920 Hematemesis: Secondary | ICD-10-CM

## 2023-07-03 DIAGNOSIS — K3189 Other diseases of stomach and duodenum: Secondary | ICD-10-CM

## 2023-07-03 DIAGNOSIS — I4891 Unspecified atrial fibrillation: Secondary | ICD-10-CM

## 2023-07-03 DIAGNOSIS — K2980 Duodenitis without bleeding: Secondary | ICD-10-CM | POA: Diagnosis not present

## 2023-07-03 DIAGNOSIS — Z980 Intestinal bypass and anastomosis status: Secondary | ICD-10-CM

## 2023-07-03 DIAGNOSIS — R933 Abnormal findings on diagnostic imaging of other parts of digestive tract: Secondary | ICD-10-CM | POA: Diagnosis not present

## 2023-07-03 DIAGNOSIS — K56609 Unspecified intestinal obstruction, unspecified as to partial versus complete obstruction: Secondary | ICD-10-CM | POA: Diagnosis not present

## 2023-07-03 DIAGNOSIS — R1084 Generalized abdominal pain: Secondary | ICD-10-CM | POA: Diagnosis not present

## 2023-07-03 DIAGNOSIS — K2970 Gastritis, unspecified, without bleeding: Secondary | ICD-10-CM

## 2023-07-03 HISTORY — PX: ESOPHAGOGASTRODUODENOSCOPY (EGD) WITH PROPOFOL: SHX5813

## 2023-07-03 HISTORY — PX: BIOPSY: SHX5522

## 2023-07-03 LAB — GLUCOSE, CAPILLARY
Glucose-Capillary: 117 mg/dL — ABNORMAL HIGH (ref 70–99)
Glucose-Capillary: 120 mg/dL — ABNORMAL HIGH (ref 70–99)
Glucose-Capillary: 126 mg/dL — ABNORMAL HIGH (ref 70–99)
Glucose-Capillary: 131 mg/dL — ABNORMAL HIGH (ref 70–99)
Glucose-Capillary: 132 mg/dL — ABNORMAL HIGH (ref 70–99)
Glucose-Capillary: 145 mg/dL — ABNORMAL HIGH (ref 70–99)
Glucose-Capillary: 146 mg/dL — ABNORMAL HIGH (ref 70–99)

## 2023-07-03 LAB — CBC
HCT: 44.2 % (ref 36.0–46.0)
Hemoglobin: 14.5 g/dL (ref 12.0–15.0)
MCH: 31.1 pg (ref 26.0–34.0)
MCHC: 32.8 g/dL (ref 30.0–36.0)
MCV: 94.8 fL (ref 80.0–100.0)
Platelets: 345 10*3/uL (ref 150–400)
RBC: 4.66 MIL/uL (ref 3.87–5.11)
RDW: 13 % (ref 11.5–15.5)
WBC: 12.2 10*3/uL — ABNORMAL HIGH (ref 4.0–10.5)
nRBC: 0 % (ref 0.0–0.2)

## 2023-07-03 LAB — ECHOCARDIOGRAM COMPLETE
Area-P 1/2: 4.15 cm2
Calc EF: 53.3 %
Height: 65 in
S' Lateral: 4.8 cm
Single Plane A2C EF: 42.7 %
Single Plane A4C EF: 62.8 %
Weight: 5301.62 [oz_av]

## 2023-07-03 LAB — BASIC METABOLIC PANEL
Anion gap: 8 (ref 5–15)
BUN: 11 mg/dL (ref 8–23)
CO2: 24 mmol/L (ref 22–32)
Calcium: 8.2 mg/dL — ABNORMAL LOW (ref 8.9–10.3)
Chloride: 106 mmol/L (ref 98–111)
Creatinine, Ser: 0.76 mg/dL (ref 0.44–1.00)
GFR, Estimated: >60 mL/min (ref >60)
Glucose, Bld: 148 mg/dL — ABNORMAL HIGH (ref 70–99)
Potassium: 3.5 mmol/L (ref 3.5–5.1)
Sodium: 138 mmol/L (ref 135–145)

## 2023-07-03 LAB — T4, FREE: Free T4: 1.24 ng/dL — ABNORMAL HIGH (ref 0.61–1.12)

## 2023-07-03 LAB — TSH: TSH: 1.937 u[IU]/mL (ref 0.350–4.500)

## 2023-07-03 LAB — MAGNESIUM: Magnesium: 1.9 mg/dL (ref 1.7–2.4)

## 2023-07-03 SURGERY — ESOPHAGOGASTRODUODENOSCOPY (EGD) WITH PROPOFOL
Anesthesia: Monitor Anesthesia Care

## 2023-07-03 MED ORDER — LIDOCAINE 2% (20 MG/ML) 5 ML SYRINGE
INTRAMUSCULAR | Status: DC | PRN
  Administered 2023-07-03: 100 mg via INTRAVENOUS

## 2023-07-03 MED ORDER — EPHEDRINE SULFATE (PRESSORS) 50 MG/ML IJ SOLN
INTRAMUSCULAR | Status: DC | PRN
  Administered 2023-07-03: 5 mg via INTRAVENOUS

## 2023-07-03 MED ORDER — METOPROLOL TARTRATE 12.5 MG HALF TABLET
12.5000 mg | ORAL_TABLET | Freq: Two times a day (BID) | ORAL | Status: DC
  Administered 2023-07-03 – 2023-07-04 (×3): 12.5 mg via ORAL
  Filled 2023-07-03 (×3): qty 1

## 2023-07-03 MED ORDER — POTASSIUM CHLORIDE 10 MEQ/100ML IV SOLN
10.0000 meq | INTRAVENOUS | Status: DC
  Administered 2023-07-03 (×4): 10 meq via INTRAVENOUS
  Filled 2023-07-03 (×4): qty 100

## 2023-07-03 MED ORDER — POTASSIUM CHLORIDE CRYS ER 20 MEQ PO TBCR: 40.0000 meq | EXTENDED_RELEASE_TABLET | Freq: Two times a day (BID) | ORAL | Status: DC

## 2023-07-03 MED ORDER — PROPOFOL 500 MG/50ML IV EMUL
INTRAVENOUS | Status: DC | PRN
  Administered 2023-07-03: 30 mg via INTRAVENOUS
  Administered 2023-07-03: 100 ug/kg/min via INTRAVENOUS

## 2023-07-03 MED ORDER — INSULIN ASPART 100 UNIT/ML IJ SOLN
0.0000 [IU] | Freq: Every day | INTRAMUSCULAR | Status: DC
Start: 2023-07-03 — End: 2023-07-04

## 2023-07-03 MED ORDER — SODIUM CHLORIDE 0.9 % IV SOLN: INTRAVENOUS | Status: DC

## 2023-07-03 MED ORDER — GLYCOPYRROLATE PF 0.2 MG/ML IJ SOSY
PREFILLED_SYRINGE | INTRAMUSCULAR | Status: DC | PRN
  Administered 2023-07-03: .1 mg via INTRAVENOUS

## 2023-07-03 MED ORDER — PANTOPRAZOLE SODIUM 40 MG PO TBEC
40.0000 mg | DELAYED_RELEASE_TABLET | Freq: Every day | ORAL | Status: DC
  Administered 2023-07-04: 40 mg via ORAL
  Filled 2023-07-03: qty 1

## 2023-07-03 MED ORDER — POTASSIUM CHLORIDE CRYS ER 20 MEQ PO TBCR
40.0000 meq | EXTENDED_RELEASE_TABLET | Freq: Once | ORAL | Status: AC
  Administered 2023-07-03: 40 meq via ORAL
  Filled 2023-07-03: qty 2

## 2023-07-03 MED ORDER — PERFLUTREN LIPID MICROSPHERE
1.0000 mL | INTRAVENOUS | Status: AC | PRN
  Administered 2023-07-03: 2 mL via INTRAVENOUS

## 2023-07-03 MED ORDER — INSULIN ASPART 100 UNIT/ML IJ SOLN
0.0000 [IU] | Freq: Three times a day (TID) | INTRAMUSCULAR | Status: DC
  Administered 2023-07-04: 2 [IU] via SUBCUTANEOUS

## 2023-07-03 MED ORDER — METOPROLOL TARTRATE 5 MG/5ML IV SOLN
5.0000 mg | Freq: Four times a day (QID) | INTRAVENOUS | Status: DC | PRN
Start: 2023-07-03 — End: 2023-07-04

## 2023-07-03 SURGICAL SUPPLY — 14 items
BLOCK BITE 60FR ADLT L/F BLUE (MISCELLANEOUS) ×3 IMPLANT
ELECT REM PT RETURN 9FT ADLT (ELECTROSURGICAL)
ELECTRODE REM PT RTRN 9FT ADLT (ELECTROSURGICAL) IMPLANT
FORCEP RJ3 GP 1.8X160 W-NEEDLE (CUTTING FORCEPS) IMPLANT
FORCEPS BIOP RAD 4 LRG CAP 4 (CUTTING FORCEPS) IMPLANT
NDL SCLEROTHERAPY 25GX240 (NEEDLE) IMPLANT
NEEDLE SCLEROTHERAPY 25GX240 (NEEDLE) IMPLANT
PROBE APC STR FIRE (PROBE) IMPLANT
PROBE INJECTION GOLD 7FR (MISCELLANEOUS) IMPLANT
SNARE SHORT THROW 13M SML OVAL (MISCELLANEOUS) IMPLANT
SYR 50ML LL SCALE MARK (SYRINGE) IMPLANT
TUBING ENDO SMARTCAP PENTAX (MISCELLANEOUS) ×6 IMPLANT
TUBING IRRIGATION ENDOGATOR (MISCELLANEOUS) ×3 IMPLANT
WATER STERILE IRR 1000ML POUR (IV SOLUTION) IMPLANT
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
## 2023-07-03 NOTE — Progress Notes (Signed)
  Echocardiogram 2D Echocardiogram has been performed.  Danielle Stephenson 07/03/2023, 4:02 PM

## 2023-07-03 NOTE — Plan of Care (Signed)
  Problem: Education: Goal: Ability to describe self-care measures that may prevent or decrease complications (Diabetes Survival Skills Education) will improve Outcome: Progressing Goal: Individualized Educational Video(s) Outcome: Progressing   Problem: Coping: Goal: Ability to adjust to condition or change in health will improve Outcome: Progressing   Problem: Fluid Volume: Goal: Ability to maintain a balanced intake and output will improve Outcome: Progressing   Problem: Health Behavior/Discharge Planning: Goal: Ability to identify and utilize available resources and services will improve Outcome: Progressing Goal: Ability to manage health-related needs will improve Outcome: Progressing   Problem: Metabolic: Goal: Ability to maintain appropriate glucose levels will improve Outcome: Progressing   Problem: Nutritional: Goal: Maintenance of adequate nutrition will improve Outcome: Progressing Goal: Progress toward achieving an optimal weight will improve Outcome: Progressing   Problem: Skin Integrity: Goal: Risk for impaired skin integrity will decrease Outcome: Progressing   Problem: Tissue Perfusion: Goal: Adequacy of tissue perfusion will improve Outcome: Progressing   Problem: Education: Goal: Knowledge of General Education information will improve Description: Including pain rating scale, medication(s)/side effects and non-pharmacologic comfort measures Outcome: Progressing   Problem: Health Behavior/Discharge Planning: Goal: Ability to manage health-related needs will improve Outcome: Progressing   Problem: Clinical Measurements: Goal: Ability to maintain clinical measurements within normal limits will improve Outcome: Progressing Goal: Will remain free from infection Outcome: Progressing Goal: Diagnostic test results will improve Outcome: Progressing Goal: Respiratory complications will improve Outcome: Progressing Goal: Cardiovascular complication will  be avoided Outcome: Progressing   Problem: Activity: Goal: Risk for activity intolerance will decrease Outcome: Progressing   Problem: Coping: Goal: Level of anxiety will decrease Outcome: Progressing   Problem: Elimination: Goal: Will not experience complications related to bowel motility Outcome: Progressing Goal: Will not experience complications related to urinary retention Outcome: Progressing   Problem: Pain Management: Goal: General experience of comfort will improve Outcome: Progressing   Problem: Skin Integrity: Goal: Risk for impaired skin integrity will decrease Outcome: Progressing

## 2023-07-03 NOTE — Transfer of Care (Signed)
Immediate Anesthesia Transfer of Care Note  Patient: Danielle Stephenson  Procedure(s) Performed: ESOPHAGOGASTRODUODENOSCOPY (EGD) WITH PROPOFOL BIOPSY  Patient Location: PACU  Anesthesia Type:MAC  Level of Consciousness: awake, alert , and oriented  Airway & Oxygen Therapy: Patient Spontanous Breathing  Post-op Assessment: Report given to RN and Post -op Vital signs reviewed and stable  Post vital signs: Reviewed and stable  Last Vitals:  Vitals Value Taken Time  BP 107/50 07/03/23 0858  Temp    Pulse 58 07/03/23 0900  Resp 14 07/03/23 0900  SpO2 93 % 07/03/23 0900  Vitals shown include unfiled device data.  Last Pain:  Vitals:   07/03/23 0820  TempSrc: Temporal  PainSc: 0-No pain         Complications: There were no known notable events for this encounter.

## 2023-07-03 NOTE — Anesthesia Postprocedure Evaluation (Signed)
Anesthesia Post Note  Patient: Danielle Stephenson  Procedure(s) Performed: ESOPHAGOGASTRODUODENOSCOPY (EGD) WITH PROPOFOL BIOPSY     Patient location during evaluation: PACU Anesthesia Type: MAC Level of consciousness: awake and alert Pain management: pain level controlled Vital Signs Assessment: post-procedure vital signs reviewed and stable Respiratory status: spontaneous breathing, nonlabored ventilation, respiratory function stable and patient connected to nasal cannula oxygen Cardiovascular status: stable and blood pressure returned to baseline Postop Assessment: no apparent nausea or vomiting Anesthetic complications: no   There were no known notable events for this encounter.  Last Vitals:  Vitals:   07/03/23 0900 07/03/23 0915  BP:    Pulse:    Resp:    Temp: (!) 36.1 C 37 C  SpO2:      Last Pain:  Vitals:   07/03/23 0915  TempSrc:   PainSc: 0-No pain                 Mariann Barter

## 2023-07-03 NOTE — Progress Notes (Signed)
[facility] PROGRESS NOTE    Progress Note  Danielle Stephenson  ZOX:[id number] DOB: [birth year] DOA: 06/30/2023 PCP: Stevphen Rochester, MD     Brief Narrative:   Danielle Stephenson is an 77 y.o. female past medical history significant for severe morbid obesity, essential hypertension obstructive sleep apnea on CPAP, hepatic steatosis endometrial cancer with a history of bariatric surgery, history of diverticulitis that required surgical intervention with wound dehiscence leading to a partial hemicolectomy, last colonoscopy in 2020 comes into the ED for complaints of abdominal pain associated with nausea and vomiting for 3 days prior to admission.  She also endorses coffee-ground emesis.  CT angio showed no evidence of bleeding, but did showed small bowel obstruction about 4 cm postanastomosis  Assessment/Plan:   Generalized abdominal pain due to SBO: NG tube has been discontinued she has been tolerating her diet. She is passing gas and having bowel movements. Try to keep potassium greater than 4 magnesium greater than 2. Replete potassium orally.  SIRS: Physiology has resolved leukocytosis improved has remained afebrile. Blood cultures remain negative till date. Keep off antibiotics.  Reported coffee-ground emesis: Hemoglobin is stable at 13. GI on board EGD was done that showed no evidence of gastritis or active ulcers biopsies were taken.  They favor peptic duodenitis. Recommended to continue PPI daily.  Follow-up with them as an outpatient for biopsy results.  New onset A-fib with RVR: Not a candidate for anticoagulation. Started on IV diltiazem now converted to sinus rhythm. She is currently on metoprolol low-dose heart rate in the 70s to 60s. Now in sinus rhythm, and is probably reactive due to SBO and probably upper GI bleed.  Sleep apnea with use of continuous positive airway pressure (CPAP) Continue CPAP at night.  Type 2 diabetes mellitus without complication, without  long-term current use of insulin (HCC) A1c is 6.6 currently n.p.o. started on sliding scale insulin. Blood glucose well-controlled continue sliding scale insulin.  Hepatic steatosis/Elevated bilirubin Isolated bilirubin repeat in the morning.  Morbid obesity with BMI of 50.0-59.9, adult (HCC) Counseling.  Ventral hernia: Follow-up with surgery as an outpatient   DVT prophylaxis: scd Family Communication:none Status is: Inpatient Remains inpatient appropriate because: Small bowel obstruction    Code Status:     Code Status Orders  (From admission, onward)           Start     Ordered   07/01/23 0434  Full code  Continuous       Question:  By:  Answer:  Consent: discussion documented in EHR   07/01/23 0433           Code Status History     Date Active Date Inactive Code Status Order ID Comments User Context   08/20/2019 0532 08/20/2019 1543 Full Code [id number]  Doylene Bode, NP Inpatient   05/18/2019 0631 05/18/2019 1340 Full Code [id number]  Sherian Rein, MD Inpatient   08/29/2017 1401 08/30/2017 1605 Full Code [id number]  Jiles Harold, PA-C Inpatient         IV Access:   Peripheral IV   Procedures and diagnostic studies:   DG Abd Portable 1V Result Date: 07/02/2023 CLINICAL DATA:  Small-bowel obstruction EXAM: PORTABLE ABDOMEN - 1 VIEW COMPARISON:  X-ray 07/01/2023 and older. FINDINGS: Markedly limited study as films are under penetrated. Enteric tube overlying the body of the stomach. Surgical clips in the upper abdomen. Persistent dilated loops of bowel throughout the midabdomen with some contrast seen along colon. On these very limited images  the amount of contrast appears decreased from previous. The right hemi abdominal edge is clipped off the edge of the film as well. IMPRESSION: Extremely limited evaluation. Contrast again seen along colon. Persistent dilated loops of bowel in the midabdomen. Please correlate with history and additional  evaluation as clinically appropriate Electronically Signed   By: Karen Kays M.D.   On: 07/02/2023 10:19   DG Abd Portable 1V-Small Bowel Obstruction Protocol-initial, 8 hr delay Result Date: 07/01/2023 CLINICAL DATA:  Assessment for small bowel obstruction, 8 hours after administration of contrast medium. EXAM: PORTABLE ABDOMEN - 1 VIEW COMPARISON:  07/01/2023 1:52 a.m. FINDINGS: Nasogastric tube tip in the stomach body. Dilute contrast medium appears to be in the colon and rectum. Mildly dilated loops of small bowel up to 4.1 cm in diameter. Thoracolumbar spondylosis.  Degenerative arthropathy of both hips. IMPRESSION: 1. Dilute contrast medium appears to be in the colon and rectum. 2. Mildly dilated loops of small bowel up to 4.1 cm in diameter. 3. Nasogastric tube tip in the stomach body. Electronically Signed   By: Gaylyn Rong M.D.   On: 07/01/2023 11:00     Medical Consultants:   None.   Subjective:    Danielle Stephenson feels much better tolerating her diet this morning.  Objective:    Vitals:   07/03/23 0510 07/03/23 0543 07/03/23 0549 07/03/23 0735  BP:   (!) 120/55 (!) 116/55  Pulse:   67 64  Resp: 19 18  20   Temp:    98.4 F (36.9 C)  TempSrc:    Oral  SpO2:    95%  Weight:      Height:       SpO2: 95 %   Intake/Output Summary (Last 24 hours) at 07/03/2023 0806 Last data filed at 07/03/2023 0744 Gross per 24 hour  Intake 1289.24 ml  Output 400 ml  Net 889.24 ml   Filed Weights   07/01/23 1133 07/02/23 0416 07/03/23 0455  Weight: (!) 136.5 kg (!) 148.7 kg (!) 150.3 kg    Exam: General exam: In no acute distress. Respiratory system: Good air movement and clear to auscultation. Cardiovascular system: S1 & S2 heard, RRR. No JVD. Gastrointestinal system: Bowel sounds soft nontender nondistended cannot appreciate a hernia this morning Extremities: No pedal edema. Skin: No rashes, lesions or ulcers Psychiatry: Judgement and insight appear normal. Mood &  affect appropriate.  Data Reviewed:    Labs: Basic Metabolic Panel: Recent Labs  Lab 06/30/23 1905 07/01/23 0644 07/01/23 0857 07/02/23 0249 07/03/23 0313 07/03/23 0314  NA 137 137 137 139  --  138  K 4.1 3.9 4.0 3.8  --  3.5  CL 99 103 105 107  --  106  CO2 23 23 27 22   --  24  GLUCOSE 197* 153* 124* 144*  --  148*  BUN 20 18 17 11   --  11  CREATININE 0.87 0.89 1.14* 0.86  --  0.76  CALCIUM 9.0 8.4* 8.0* 7.8*  --  8.2*  MG  --  2.3 2.3  --  1.9  --   PHOS  --  3.4  --   --   --   --    GFR Estimated Creatinine Clearance: 90.5 mL/min (by C-G formula based on SCr of 0.76 mg/dL). Liver Function Tests: Recent Labs  Lab 06/30/23 1905 07/01/23 0644  AST 23 26  ALT 29 33  ALKPHOS 50 44  BILITOT 1.5* 1.4*  PROT 6.5 6.0*  ALBUMIN 2.8* 2.7*  Recent Labs  Lab 06/30/23 1905  LIPASE 27   No results for input(s): "AMMONIA" in the last 168 hours. Coagulation profile No results for input(s): "INR", "PROTIME" in the last 168 hours. COVID-19 Labs  No results for input(s): "DDIMER", "FERRITIN", "LDH", "CRP" in the last 72 hours.  Lab Results  Component Value Date   SARSCOV2NAA NEGATIVE 06/30/2023   SARSCOV2NAA NEGATIVE 10/02/2020   SARSCOV2NAA NEGATIVE 08/17/2019   SARSCOV2NAA NOT DETECTED 05/15/2019    CBC: Recent Labs  Lab 06/30/23 1905 07/01/23 0644 07/01/23 0857 07/01/23 2348 07/02/23 1134 07/03/23 0018  WBC 18.0* 15.1* 12.9* 12.2* 11.8* 12.2*  NEUTROABS 15.5* 11.6*  --   --   --   --   HGB 15.1* 14.5 12.9 13.7 14.1 14.5  HCT 45.6 43.6 39.8 41.7 43.5 44.2  MCV 94.2 95.2 96.6 96.3 95.0 94.8  PLT 372 297 285 295 313 345   Cardiac Enzymes: No results for input(s): "CKTOTAL", "CKMB", "CKMBINDEX", "TROPONINI" in the last 168 hours. BNP (last 3 results) No results for input(s): "PROBNP" in the last 8760 hours. CBG: Recent Labs  Lab 07/02/23 1527 07/02/23 1950 07/02/23 2356 07/03/23 0454 07/03/23 0732  GLUCAP 168* 139* 120* 145* 132*    D-Dimer: No results for input(s): "DDIMER" in the last 72 hours. Hgb A1c: Recent Labs    07/01/23 0644  HGBA1C 6.6*   Lipid Profile: No results for input(s): "CHOL", "HDL", "LDLCALC", "TRIG", "CHOLHDL", "LDLDIRECT" in the last 72 hours. Thyroid function studies: Recent Labs    07/03/23 0313  TSH 1.937   Anemia work up: No results for input(s): "VITAMINB12", "FOLATE", "FERRITIN", "TIBC", "IRON", "RETICCTPCT" in the last 72 hours. Sepsis Labs: Recent Labs  Lab 06/30/23 1922 06/30/23 2154 07/01/23 0536 07/01/23 0644 07/01/23 0857 07/01/23 2348 07/02/23 1134 07/03/23 0018  WBC  --   --   --    < > 12.9* 12.2* 11.8* 12.2*  LATICACIDVEN 3.3* 2.1* 1.7  --   --   --   --   --    < > = values in this interval not displayed.   Microbiology Recent Results (from the past 240 hours)  Resp panel by RT-PCR (RSV, Flu A&B, Covid) Anterior Nasal Swab     Status: None   Collection Time: 06/30/23  6:38 PM   Specimen: Anterior Nasal Swab  Result Value Ref Range Status   SARS Coronavirus 2 by RT PCR NEGATIVE NEGATIVE Final   Influenza A by PCR NEGATIVE NEGATIVE Final   Influenza B by PCR NEGATIVE NEGATIVE Final    Comment: (NOTE) The Xpert Xpress SARS-CoV-2/FLU/RSV plus assay is intended as an aid in the diagnosis of influenza from Nasopharyngeal swab specimens and should not be used as a sole basis for treatment. Nasal washings and aspirates are unacceptable for Xpert Xpress SARS-CoV-2/FLU/RSV testing.  Fact Sheet for Patients: BloggerCourse.com  Fact Sheet for Healthcare Providers: SeriousBroker.it  This test is not yet approved or cleared by the [place] FDA and has been authorized for detection and/or diagnosis of SARS-CoV-2 by FDA under an Emergency Use Authorization (EUA). This EUA will remain in effect (meaning this test can be used) for the duration of the COVID-19 declaration under Section 564(b)(1) of the Act, 21  U.S.C. section 360bbb-3(b)(1), unless the authorization is terminated or revoked.     Resp Syncytial Virus by PCR NEGATIVE NEGATIVE Final    Comment: (NOTE) Fact Sheet for Patients: BloggerCourse.com  Fact Sheet for Healthcare Providers: SeriousBroker.it  This test is not yet approved or  cleared by the [place] and has been authorized for detection and/or diagnosis of SARS-CoV-2 by FDA under an Emergency Use Authorization (EUA). This EUA will remain in effect (meaning this test can be used) for the duration of the COVID-19 declaration under Section 564(b)(1) of the Act, 21 U.S.C. section 360bbb-3(b)(1), unless the authorization is terminated or revoked.  Performed at [facility], [address], [place], [place] [address]   Culture, blood (routine x 2)     Status: None (Preliminary result)   Collection Time: 07/01/23 12:12 AM   Specimen: BLOOD  Result Value Ref Range Status   Specimen Description BLOOD BLOOD LEFT HAND  Final   Special Requests   Final    BOTTLES DRAWN AEROBIC AND ANAEROBIC Blood Culture results may not be optimal due to an inadequate volume of blood received in culture bottles   Culture   Final    NO GROWTH 1 DAY Performed at [facility], [address], [place], [place] [address]    Report Status PENDING  Incomplete     Medications:    insulin aspart  0-9 Units Subcutaneous Q4H   metoprolol tartrate  12.5 mg Oral BID   pantoprazole (PROTONIX) IV  40 mg Intravenous BID   Continuous Infusions:  sodium chloride     potassium chloride 10 mEq (07/03/23 0743)      LOS: 2 days   Marinda Elk  [facility]  07/03/2023, 8:06 AM

## 2023-07-03 NOTE — Progress Notes (Signed)
   07/03/23 2013  BiPAP/CPAP/SIPAP  BiPAP/CPAP/SIPAP Pt Type Adult  BiPAP/CPAP/SIPAP Resmed  BiPAP/CPAP /SiPAP Vitals  Resp 18   Pt doesn't want to wear CPAP for the night.

## 2023-07-03 NOTE — Progress Notes (Addendum)
New onset of A-fib RVR-resolved-converted to sinus rhythm - Patient found to have new onset of A-fib RVR around 6 PM 12/17.  EKG showing A-fib RVR heart rate 122.  Patient has been started on Cardizem drip 20 mg/h.  Around 3:30 AM today 12/18 patient has been converted to sinus rhythm heart rate between 60-64 and I have decreased the Cardizem drip rate to 10 mg/h for 1 hour and eventually plan to stop it. Spoke with on-call cardiology Dr. Geraldo Pitter over the phone for discussion how to transition to oral Cardizem to p.o. Cardizem versus metoprolol.  Given patient has situational atrial fibrillation stated that can start p.o. metoprolol 12.5 mg twice daily and stop the Cardizem drip and see how the heart rate response with that. - Has converted to sinus rhythm holding the Cardizem drip now.   -Starting p.o. metoprolol 12.5 mg twice daily.  Continue as needed Lopressor 5 mg every 6 hour as needed for heart rate above 120 with holding parameter blood pressure less than 90/60. - Continue cardiac monitoring. -Goal to keep potassium above 4 and mag above 2.  Checking TSH level - Potassium level is 3.5, replating with IV KCl 56M EQ x 4 doses goal to keep K level above 4. - Holding any anticoagulation as patient also having coffee-ground emesis 1 days ago and GI on board for further evaluation for possible endoscopy.  Tereasa Coop, MD [facility] 07/03/2023, 4:42 AM

## 2023-07-03 NOTE — Anesthesia Preprocedure Evaluation (Signed)
Anesthesia Evaluation  Patient identified by MRN, date of birth, ID band Patient awake    Reviewed: Allergy & Precautions, NPO status , Patient's Chart, lab work & pertinent test results, reviewed documented beta blocker date and time   History of Anesthesia Complications Negative for: history of anesthetic complications  Airway Mallampati: III  TM Distance: >3 FB     Dental no notable dental hx.    Pulmonary asthma , sleep apnea and Continuous Positive Airway Pressure Ventilation , pneumonia, resolved   breath sounds clear to auscultation       Cardiovascular hypertension, (-) angina (-) CAD, (-) Past MI and (-) Cardiac Stents  Rhythm:Regular Rate:Normal     Neuro/Psych  Headaches, neg Seizures PSYCHIATRIC DISORDERS  Depression       GI/Hepatic ,neg GERD  ,,(+) neg Cirrhosis        Endo/Other  diabetes, Type 2  Class 4 obesity  Renal/GU Renal disease     Musculoskeletal  (+) Arthritis ,    Abdominal   Peds  Hematology   Anesthesia Other Findings   Reproductive/Obstetrics                             Anesthesia Physical Anesthesia Plan  ASA: 3  Anesthesia Plan: MAC   Post-op Pain Management:    Induction:   PONV Risk Score and Plan: 1 and Ondansetron  Airway Management Planned:   Additional Equipment:   Intra-op Plan:   Post-operative Plan:   Informed Consent: I have reviewed the patients History and Physical, chart, labs and discussed the procedure including the risks, benefits and alternatives for the proposed anesthesia with the patient or authorized representative who has indicated his/her understanding and acceptance.     Dental advisory given  Plan Discussed with: CRNA  Anesthesia Plan Comments:        Anesthesia Quick Evaluation

## 2023-07-03 NOTE — Plan of Care (Signed)
  Problem: Nutrition: Goal: Adequate nutrition will be maintained Outcome: Completed/Met   Problem: Safety: Goal: Ability to remain free from injury will improve Outcome: Completed/Met

## 2023-07-03 NOTE — Op Note (Signed)
[facility] Patient Name: Danielle Stephenson Procedure Date : 07/03/2023 MRN: [id number] Attending MD: Beverley Fiedler , MD, [id number] Date of Birth: [birth year] CSN: [id number] Age: 77 Admit Type: Inpatient Procedure:                Upper GI endoscopy Indications:              Coffee-ground emesis, Abnormal CT of the GI tract                            (CTA + for bleeding at GE junction) Providers:                Carie Caddy. Rhea Belton, MD, Glory Rosebush, RN, Kandice Robinsons, Technician Referring MD:             [facility] Medicines:                Monitored Anesthesia Care Complications:            No immediate complications. Estimated Blood Loss:     Estimated blood loss was minimal. Procedure:                Pre-Anesthesia Assessment:                           - Prior to the procedure, a History and Physical                            was performed, and patient medications and                            allergies were reviewed. The patient's tolerance of                            previous anesthesia was also reviewed. The risks                            and benefits of the procedure and the sedation                            options and risks were discussed with the patient.                            All questions were answered, and informed consent                            was obtained. Prior Anticoagulants: The patient has                            taken no anticoagulant or antiplatelet agents. ASA                            Grade Assessment: III - A patient with severe  systemic disease. After reviewing the risks and                            benefits, the patient was deemed in satisfactory                            condition to undergo the procedure.                           After obtaining informed consent, the endoscope was                            passed under direct vision. Throughout the                             procedure, the patient's blood pressure, pulse, and                            oxygen saturations were monitored continuously. The                            [id number]-H190 ([id number] Olympus endoscope was introduced                            through the mouth, and advanced to the second part                            of duodenum. The upper GI endoscopy was                            accomplished without difficulty. The patient                            tolerated the procedure well. Scope In: Scope Out: Findings:      The examined esophagus was normal.      Evidence of a previous surgical intervention in the cardia. This was       characterized by an intact staple line. There was a mucosal bridge in       the cardia but no evidence of active or recent bleeding.      Diffuse mildly erythematous mucosa without bleeding was found in the       gastric body and in the gastric antrum. Biopsies were taken with a cold       forceps for Helicobacter pylori testing.      Nodular mucosa was found in the duodenal bulb. Biopsies were taken with       a cold forceps for histology.      The second portion of the duodenum was normal. Impression:               - Normal esophagus.                           - Previous surgical intervention in the gastric  cardia was found, characterized by an intact staple                            line and a mucosal bridge. No evidence of ulcer,                            active or recent bleeding.                           - Erythematous mucosa in the gastric body and                            antrum. Biopsied.                           - Nodular mucosa in the duodenal bulb. Biopsied to                            exclude adenoma (peptic duodenitis favored).                           - Normal second portion of the duodenum. Moderate Sedation:      N/A Recommendation:           - Return patient to hospital ward for ongoing  care.                           - Advance diet as tolerated.                           - Continue present medications. Daily PPI is                            recommended.                           - Await pathology results.                           - GI will sign off, call if questions. Procedure Code(s):        --- Professional ---                           [phone], Esophagogastroduodenoscopy, flexible,                            transoral; with biopsy, single or multiple Diagnosis Code(s):        --- Professional ---                           Z98.0, Intestinal bypass and anastomosis status                           K31.89, Other diseases of stomach and duodenum  K92.0, Hematemesis                           R93.3, Abnormal findings on diagnostic imaging of                            other parts of digestive tract CPT copyright 2022 [employer]. All rights reserved. The codes documented in this report are preliminary and upon coder review may  be revised to meet current compliance requirements. Beverley Fiedler, MD 07/03/2023 9:02:23 AM This report has been signed electronically. Number of Addenda: 0

## 2023-07-03 NOTE — Progress Notes (Signed)
Pt converted to Sinus Rhythm HR 60-64. Cardizem infusing at 20mg /hr.  BP 112/82, SpO2 96% on RA. Reviewed order with Sundil, MD. RN instructed to change Cardizem  rate to 10mg /hr, and discontinue cardizem after an hour. Will continue to monitor pt closely.

## 2023-07-03 NOTE — Progress Notes (Signed)
Progress Note   Subjective: Pt underwent EGD this AM. Having bowel function. Abdominal pain improved.   Objective: Vital signs in last 24 hours: Temp:  [97 F (36.1 C)-98.6 F (37 C)] 98.1 F (36.7 C) (12/18 1105) Pulse Rate:  [60-104] 71 (12/18 1105) Resp:  [12-25] 16 (12/18 1105) BP: (107-155)/(51-98) 107/60 (12/18 1105) SpO2:  [90 %-96 %] 96 % (12/18 1105) Weight:  [150.3 kg] 150.3 kg (12/18 0820) Last BM Date : 07/02/23  Intake/Output from previous day: 12/17 0701 - 12/18 0700 In: 1269.2 [P.O.:600; I.V.:569.2; IV Piggyback:100] Out: 401 [Urine:400; Stool:1] Intake/Output this shift: Total I/O In: 520 [P.O.:120; I.V.:200; IV Piggyback:200] Out: 200 [Urine:200]  PE: General: pleasant, WD, obese female who is laying in bed in NAD Heart: irregularly irregular Lungs:  Respiratory effort nonlabored Abd: soft, NT, ND Psych: A&Ox3 with an appropriate affect.    Lab Results:  Recent Labs    07/02/23 1134 07/03/23 0018  WBC 11.8* 12.2*  HGB 14.1 14.5  HCT 43.5 44.2  PLT 313 345   BMET Recent Labs    07/02/23 0249 07/03/23 0314  NA 139 138  K 3.8 3.5  CL 107 106  CO2 22 24  GLUCOSE 144* 148*  BUN 11 11  CREATININE 0.86 0.76  CALCIUM 7.8* 8.2*   PT/INR No results for input(s): "LABPROT", "INR" in the last 72 hours. CMP     Component Value Date/Time   NA 138 07/03/2023 0314   K 3.5 07/03/2023 0314   CL 106 07/03/2023 0314   CO2 24 07/03/2023 0314   GLUCOSE 148 (H) 07/03/2023 0314   BUN 11 07/03/2023 0314   CREATININE 0.76 07/03/2023 0314   CALCIUM 8.2 (L) 07/03/2023 0314   PROT 6.0 (L) 07/01/2023 0644   ALBUMIN 2.7 (L) 07/01/2023 0644   AST 26 07/01/2023 0644   ALT 33 07/01/2023 0644   ALKPHOS 44 07/01/2023 0644   BILITOT 1.4 (H) 07/01/2023 0644   GFRNONAA >60 07/03/2023 0314   GFRAA >60 08/17/2019 1209   Lipase     Component Value Date/Time   LIPASE 27 06/30/2023 1905       Studies/Results: DG Abd Portable 1V Result Date:  07/02/2023 CLINICAL DATA:  Small-bowel obstruction EXAM: PORTABLE ABDOMEN - 1 VIEW COMPARISON:  X-ray 07/01/2023 and older. FINDINGS: Markedly limited study as films are under penetrated. Enteric tube overlying the body of the stomach. Surgical clips in the upper abdomen. Persistent dilated loops of bowel throughout the midabdomen with some contrast seen along colon. On these very limited images the amount of contrast appears decreased from previous. The right hemi abdominal edge is clipped off the edge of the film as well. IMPRESSION: Extremely limited evaluation. Contrast again seen along colon. Persistent dilated loops of bowel in the midabdomen. Please correlate with history and additional evaluation as clinically appropriate Electronically Signed   By: Karen Kays M.D.   On: 07/02/2023 10:19    Anti-infectives: Anti-infectives (From admission, onward)    Start     Dose/Rate Route Frequency Ordered Stop   07/01/23 0600  piperacillin-tazobactam (ZOSYN) IVPB 3.375 g  Status:  Discontinued        3.375 g 100 mL/hr over 30 Minutes Intravenous Every 8 hours 07/01/23 0507 07/01/23 0510   07/01/23 0600  piperacillin-tazobactam (ZOSYN) IVPB 3.375 g  Status:  Discontinued        3.375 g 12.5 mL/hr over 240 Minutes Intravenous Every 8 hours 07/01/23 0510 07/01/23 0727   06/30/23 2315  piperacillin-tazobactam (ZOSYN) IVPB  3.375 g        3.375 g 100 mL/hr over 30 Minutes Intravenous  Once 06/30/23 2318 07/01/23 0217        Assessment/Plan  SBO - contrast passed into colon on 8h film and patient having bowel function  - suspect this may be more of a motility issue secondary to recent back injury and decreased mobility - HH diet as tolerated and recommend bowel regimen at home - no surgical intervention recommended, general surgery will sign off ?UGIB - s/p EGD today with no signs of bleeding  FEN: HH diet VTE: SCDs ID: Zosyn 12/15>12/6  - per TR[facility] -  A.Fib w/ RVR - cardizem  gtt TwDM HTN ?Cirrhosis  Morbid obesity  Hx of endometrial CA OSA on CPAP Depression Recent back trauma in the last year   LOS: 2 days   I reviewed Consultant GI notes, hospitalist notes, last 24 h vitals and pain scores, last 48 h intake and output, last 24 h labs and trends, and last 24 h imaging results.     Juliet Rude, [facility] 07/03/2023, 12:18 PM Please see Amion for pager number during day hours 7:00am-4:30pm

## 2023-07-03 NOTE — Interval H&P Note (Signed)
History and Physical Interval Note: For upper endoscopy today to evaluate coffee-ground emesis and positive CTA near GE junction SBO has resolved RVR improved with rate control     Latest Ref Rng & Units 07/03/2023   12:18 AM 07/02/2023   11:34 AM 07/01/2023   11:48 PM  CBC  WBC 4.0 - 10.5 K/uL 12.2  11.8  12.2   Hemoglobin 12.0 - 15.0 g/dL 86.5  78.4  69.6   Hematocrit 36.0 - 46.0 % 44.2  43.5  41.7   Platelets 150 - 400 K/uL 345  313  295     07/03/2023 8:16 AM  Danielle Stephenson  has presented today for surgery, with the diagnosis of melena, recent upper gastrointestinal bleeding.  The various methods of treatment have been discussed with the patient and family. After consideration of risks, benefits and other options for treatment, the patient has consented to  Procedure(s): ESOPHAGOGASTRODUODENOSCOPY (EGD) WITH PROPOFOL (N/A) as a surgical intervention.  The patient's history has been reviewed, patient examined, no change in status, stable for surgery.  I have reviewed the patient's chart and labs.  Questions were answered to the patient's satisfaction.     Carie Caddy Tranell Wojtkiewicz

## 2023-07-03 NOTE — Progress Notes (Signed)
2D echo attempted, pt care in room

## 2023-07-04 DIAGNOSIS — K56609 Unspecified intestinal obstruction, unspecified as to partial versus complete obstruction: Secondary | ICD-10-CM | POA: Diagnosis not present

## 2023-07-04 DIAGNOSIS — R1084 Generalized abdominal pain: Secondary | ICD-10-CM | POA: Diagnosis not present

## 2023-07-04 LAB — BASIC METABOLIC PANEL
Anion gap: 9 (ref 5–15)
BUN: 13 mg/dL (ref 8–23)
CO2: 22 mmol/L (ref 22–32)
Calcium: 8.3 mg/dL — ABNORMAL LOW (ref 8.9–10.3)
Chloride: 107 mmol/L (ref 98–111)
Creatinine, Ser: 1.1 mg/dL — ABNORMAL HIGH (ref 0.44–1.00)
GFR, Estimated: 52 mL/min — ABNORMAL LOW (ref >60)
Glucose, Bld: 142 mg/dL — ABNORMAL HIGH (ref 70–99)
Potassium: 3.9 mmol/L (ref 3.5–5.1)
Sodium: 138 mmol/L (ref 135–145)

## 2023-07-04 LAB — GLUCOSE, CAPILLARY: Glucose-Capillary: 128 mg/dL — ABNORMAL HIGH (ref 70–99)

## 2023-07-04 MED ORDER — PANTOPRAZOLE SODIUM 40 MG PO TBEC: 40.0000 mg | DELAYED_RELEASE_TABLET | Freq: Every day | ORAL | 0 refills | Status: DC

## 2023-07-04 MED ORDER — HYDROCHLOROTHIAZIDE 25 MG PO TABS: 25.0000 mg | ORAL_TABLET | Freq: Every day | ORAL | Status: DC

## 2023-07-04 MED ORDER — LOSARTAN POTASSIUM 100 MG PO TABS: 100.0000 mg | ORAL_TABLET | Freq: Every day | ORAL | Status: DC

## 2023-07-04 MED ORDER — METOPROLOL TARTRATE 25 MG PO TABS: 12.5000 mg | ORAL_TABLET | Freq: Two times a day (BID) | ORAL | 1 refills | Status: DC

## 2023-07-04 MED ORDER — ACETAMINOPHEN 325 MG PO TABS: 650.0000 mg | ORAL_TABLET | Freq: Four times a day (QID) | ORAL | Status: DC | PRN

## 2023-07-04 NOTE — TOC Transition Note (Signed)
Transition of Care [facility]) - Discharge Note   Patient Details  Name: Danielle Stephenson MRN: [id number] Date of Birth: 1946/06/02  [facility] CM/SW Contact:  Leone Haven, RN Phone Number: 07/04/2023, 9:52 AM   Clinical Narrative:    For dc today, NCM made referral for outpatient physical therapy for patient on [address] thru [facility].  She states she has transportation, her daughter.     Barriers to Discharge: Continued Medical Work up   Patient Goals and CMS Choice Patient states their goals for this hospitalization and ongoing recovery are:: return home   Choice offered to / list presented to : NA      Discharge Placement                       Discharge Plan and Services Additional resources added to the After Visit Summary for   In-house Referral: NA Discharge Planning Services: CM Consult Post Acute Care Choice: NA          DME Arranged: N/A DME Agency: NA       HH Arranged: NA          Social Drivers of Health (SDOH) Interventions SDOH Screenings   Food Insecurity: No Food Insecurity (07/01/2023)  Housing: Unknown (07/01/2023)  Transportation Needs: No Transportation Needs (07/01/2023)  Utilities: Not At Risk (07/01/2023)  Financial Resource Strain: Low Risk  (12/18/2022)   Received from [facility], [facility]  Physical Activity: Insufficiently Active (12/18/2022)   Received from [facility], [facility]  Social Connections: Socially Integrated (12/18/2022)   Received from [facility], [facility]  Stress: No Stress Concern Present (12/18/2022)   Received from [facility], [facility]  Tobacco Use: Low Risk  (07/03/2023)     Readmission Risk Interventions    07/02/2023    3:42 PM  Readmission Risk Prevention Plan  Transportation Screening Complete  PCP or Specialist Appt within 5-7 Days Complete  Home Care Screening Complete  Medication Review (RN CM) Complete

## 2023-07-04 NOTE — Discharge Summary (Addendum)
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 of significant diagnostics from this hospitalization (including imaging, microbiology, ancillary and laboratory) are listed below for reference.     Microbiology: Recent Results (from the past 240 hours)  Resp panel by RT-PCR (RSV, Flu A&B, Covid) Anterior Nasal Swab     Status: None   Collection Time: 06/30/23  6:38 PM   Specimen: Anterior Nasal Swab  Result Value Ref Range Status   SARS Coronavirus 2 by RT PCR NEGATIVE NEGATIVE Final   Influenza A by PCR NEGATIVE NEGATIVE Final   Influenza B by PCR NEGATIVE NEGATIVE Final    Comment: (NOTE) The Xpert Xpress SARS-CoV-2/FLU/RSV plus assay is intended as an aid in the diagnosis  of influenza from Nasopharyngeal swab specimens and should not be used as a sole basis for treatment. Nasal washings and aspirates are unacceptable for Xpert Xpress SARS-CoV-2/FLU/RSV testing.  Fact Sheet for Patients: BloggerCourse.com  Fact Sheet for Healthcare Providers: SeriousBroker.it  This  test is not yet approved or cleared by the [place] FDA and has been authorized for detection and/or diagnosis of SARS-CoV-2 by FDA under an Emergency Use Authorization (EUA). This EUA will remain in effect (meaning this test can be used) for the duration of the COVID-19 declaration under Section 564(b)(1) of the Act, 21 U.S.C. section 360bbb-3(b)(1), unless the authorization is terminated or revoked.     Resp Syncytial Virus by PCR NEGATIVE NEGATIVE Final    Comment: (NOTE) Fact Sheet for Patients: BloggerCourse.com  Fact Sheet for Healthcare Providers: SeriousBroker.it  This test is not yet approved or cleared by the [place] FDA and has been authorized for detection and/or diagnosis of SARS-CoV-2 by FDA under an Emergency Use Authorization (EUA). This EUA will remain in effect (meaning this test can be used) for the duration of the COVID-19 declaration under Section 564(b)(1) of the Act, 21 U.S.C. section 360bbb-3(b)(1), unless the authorization is terminated or revoked.  Performed at [facility], [address] [place], [place] [address]   Culture, blood (routine x 2)     Status: None (Preliminary result)   Collection Time: 07/01/23 12:12 AM   Specimen: BLOOD  Result Value Ref Range Status   Specimen Description BLOOD BLOOD LEFT HAND  Final   Special Requests   Final    BOTTLES DRAWN AEROBIC AND ANAEROBIC Blood Culture results may not be optimal due to an inadequate volume of blood received in culture bottles   Culture   Final    NO GROWTH 2 DAYS Performed at [facility], [address] [place], [place] [address]    Report Status PENDING  Incomplete     Labs: BNP (last 3 results) No results for input(s): "BNP" in the last 8760 hours. Basic Metabolic Panel: Recent Labs  Lab 07/01/23 0644 07/01/23 0857 07/02/23 0249 07/03/23 0313 07/03/23 0314 07/04/23 0254  NA 137 137 139  --  138 138   K 3.9 4.0 3.8  --  3.5 3.9  CL 103 105 107  --  106 107  CO2 23 27 22   --  24 22  GLUCOSE 153* 124* 144*  --  148* 142*  BUN 18 17 11   --  11 13  CREATININE 0.89 1.14* 0.86  --  0.76 1.10*  CALCIUM 8.4* 8.0* 7.8*  --  8.2* 8.3*  MG 2.3 2.3  --  1.9  --   --   PHOS 3.4  --   --   --   --   --    Liver Function Tests: Recent Labs  Lab 06/30/23 1905 07/01/23 0644  AST 23 26  ALT 29 33  ALKPHOS 50 44  BILITOT 1.5* 1.4*  PROT 6.5 6.0*  ALBUMIN 2.8* 2.7*   Recent Labs  Lab 06/30/23 1905  LIPASE 27   No results for input(s): "AMMONIA" in the last 168 hours. CBC: Recent Labs  Lab 06/30/23 1905 07/01/23 0644 07/01/23 0857 07/01/23 2348 07/02/23 1134 07/03/23 0018  WBC 18.0* 15.1* 12.9* 12.2* 11.8* 12.2*  NEUTROABS 15.5* 11.6*  --   --   --   --   HGB 15.1* 14.5 12.9 13.7 14.1 14.5  HCT 45.6 43.6 39.8 41.7 43.5 44.2  MCV  94.2 95.2 96.6 96.3 95.0 94.8  PLT 372 297 285 295 313 345   Cardiac Enzymes: No results for input(s): "CKTOTAL", "CKMB", "CKMBINDEX", "TROPONINI" in the last 168 hours. BNP: Invalid input(s): "POCBNP" CBG: Recent Labs  Lab 07/03/23 0901 07/03/23 1101 07/03/23 1646 07/03/23 2009 07/04/23 0558  GLUCAP 126* 117* 146* 131* 128*   D-Dimer No results for input(s): "DDIMER" in the last 72 hours. Hgb A1c No results for input(s): "HGBA1C" in the last 72 hours. Lipid Profile No results for input(s): "CHOL", "HDL", "LDLCALC", "TRIG", "CHOLHDL", "LDLDIRECT" in the last 72 hours. Thyroid function studies Recent Labs    07/03/23 0313  TSH 1.937   Anemia work up No results for input(s): "VITAMINB12", "FOLATE", "FERRITIN", "TIBC", "IRON", "RETICCTPCT" in the last 72 hours. Urinalysis    Component Value Date/Time   COLORURINE YELLOW 07/01/2023 0248   APPEARANCEUR HAZY (A) 07/01/2023 0248   LABSPEC >1.046 (H) 07/01/2023 0248   PHURINE 5.0 07/01/2023 0248   GLUCOSEU NEGATIVE 07/01/2023 0248   HGBUR SMALL (A) 07/01/2023 0248   BILIRUBINUR  NEGATIVE 07/01/2023 0248   KETONESUR NEGATIVE 07/01/2023 0248   PROTEINUR NEGATIVE 07/01/2023 0248   NITRITE NEGATIVE 07/01/2023 0248   LEUKOCYTESUR SMALL (A) 07/01/2023 0248   Sepsis Labs Recent Labs  Lab 07/01/23 0857 07/01/23 2348 07/02/23 1134 07/03/23 0018  WBC 12.9* 12.2* 11.8* 12.2*   Microbiology Recent Results (from the past 240 hours)  Resp panel by RT-PCR (RSV, Flu A&B, Covid) Anterior Nasal Swab     Status: None   Collection Time: 06/30/23  6:38 PM   Specimen: Anterior Nasal Swab  Result Value Ref Range Status   SARS Coronavirus 2 by RT PCR NEGATIVE NEGATIVE Final   Influenza A by PCR NEGATIVE NEGATIVE Final   Influenza B by PCR NEGATIVE NEGATIVE Final    Comment: (NOTE) The Xpert Xpress SARS-CoV-2/FLU/RSV plus assay is intended as an aid in the diagnosis of influenza from Nasopharyngeal swab specimens and should not be used as a sole basis for treatment. Nasal washings and aspirates are unacceptable for Xpert Xpress SARS-CoV-2/FLU/RSV testing.  Fact Sheet for Patients: BloggerCourse.com  Fact Sheet for Healthcare Providers: SeriousBroker.it  This test is not yet approved or cleared by the [place] FDA and has been authorized for detection and/or diagnosis of SARS-CoV-2 by FDA under an Emergency Use Authorization (EUA). This EUA will remain in effect (meaning this test can be used) for the duration of the COVID-19 declaration under Section 564(b)(1) of the Act, 21 U.S.C. section 360bbb-3(b)(1), unless the authorization is terminated or revoked.     Resp Syncytial Virus by PCR NEGATIVE NEGATIVE Final    Comment: (NOTE) Fact Sheet for Patients: BloggerCourse.com  Fact Sheet for Healthcare Providers: SeriousBroker.it  This test is not yet approved or cleared by the [place] FDA and has been authorized for detection and/or diagnosis of SARS-CoV-2  by FDA under an Emergency Use Authorization (EUA). This EUA will remain in effect (meaning this test can be used) for the duration of the COVID-19 declaration under Section 564(b)(1) of the Act, 21 U.S.C. section 360bbb-3(b)(1), unless the authorization is terminated or revoked.  Performed at [facility], [address], [place], [place] [address]   Culture, blood (routine x 2)     Status: None (Preliminary result)   Collection Time: 07/01/23 12:12 AM   Specimen: BLOOD  Result Value Ref Range Status   Specimen Description BLOOD BLOOD LEFT HAND  Final   Special Requests   Final  BOTTLES DRAWN AEROBIC AND ANAEROBIC Blood Culture results may not be optimal due to an inadequate volume of blood received in culture bottles   Culture   Final    NO GROWTH 2 DAYS Performed at [facility], [address], [place], [place] [address]    Report Status PENDING  Incomplete     Time coordinating discharge: Over 35 minutes  SIGNED:   Marinda Elk, MD  [facility] 07/04/2023, 8:44 AM Pager   If 7PM-7AM, please contact night-coverage [email or web address] Password [email or web address]

## 2023-07-04 NOTE — Progress Notes (Signed)
Discharge instructions reviewed with pt.  Copy of instructions given to pt. Pt informed her scripts were sent to her pharmacy for pick up. Handout on new medications provided. Pt's ride will be coming next few hours if not sooner, pt will go down to the discharge lounge while she waits. Lounge transport to pick up and take to the lounge. Pt will be d/c'd via wheelchair with belongings.              Dashay Giesler,RN SWOT

## 2023-07-05 ENCOUNTER — Encounter (HOSPITAL_COMMUNITY): Payer: Self-pay | Admitting: Internal Medicine

## 2023-07-05 LAB — SURGICAL PATHOLOGY

## 2023-07-06 ENCOUNTER — Encounter: Payer: Self-pay | Admitting: Internal Medicine

## 2023-07-06 LAB — CULTURE, BLOOD (ROUTINE X 2): Culture: NO GROWTH

## 2023-07-08 ENCOUNTER — Encounter (HOSPITAL_COMMUNITY): Payer: Self-pay | Admitting: Internal Medicine
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
## 2023-07-24 ENCOUNTER — Encounter: Payer: Self-pay | Admitting: Adult Health

## 2023-07-24 ENCOUNTER — Ambulatory Visit: Payer: Medicare Other | Admitting: Adult Health

## 2023-08-28 ENCOUNTER — Emergency Department (HOSPITAL_COMMUNITY): Payer: Medicare Other

## 2023-08-28 ENCOUNTER — Other Ambulatory Visit: Payer: Self-pay

## 2023-08-28 ENCOUNTER — Encounter (HOSPITAL_COMMUNITY): Payer: Self-pay

## 2023-08-28 ENCOUNTER — Inpatient Hospital Stay (HOSPITAL_COMMUNITY)
Admission: EM | Admit: 2023-08-28 | Discharge: 2023-09-01 | DRG: 641 | Disposition: A | Discharge status: Home / Self Care | Payer: Medicare Other | Attending: Internal Medicine | Admitting: Internal Medicine

## 2023-08-28 DIAGNOSIS — Z1152 Encounter for screening for COVID-19: Secondary | ICD-10-CM | POA: Diagnosis not present

## 2023-08-28 DIAGNOSIS — E119 Type 2 diabetes mellitus without complications: Secondary | ICD-10-CM | POA: Diagnosis present

## 2023-08-28 DIAGNOSIS — Z808 Family history of malignant neoplasm of other organs or systems: Secondary | ICD-10-CM

## 2023-08-28 DIAGNOSIS — F32A Depression, unspecified: Secondary | ICD-10-CM | POA: Diagnosis present

## 2023-08-28 DIAGNOSIS — I10 Essential (primary) hypertension: Secondary | ICD-10-CM | POA: Diagnosis present

## 2023-08-28 DIAGNOSIS — G4733 Obstructive sleep apnea (adult) (pediatric): Secondary | ICD-10-CM

## 2023-08-28 DIAGNOSIS — Z8049 Family history of malignant neoplasm of other genital organs: Secondary | ICD-10-CM | POA: Diagnosis not present

## 2023-08-28 DIAGNOSIS — M109 Gout, unspecified: Secondary | ICD-10-CM | POA: Diagnosis present

## 2023-08-28 DIAGNOSIS — Z888 Allergy status to other drugs, medicaments and biological substances status: Secondary | ICD-10-CM

## 2023-08-28 DIAGNOSIS — Z96652 Presence of left artificial knee joint: Secondary | ICD-10-CM | POA: Diagnosis present

## 2023-08-28 DIAGNOSIS — N179 Acute kidney failure, unspecified: Secondary | ICD-10-CM | POA: Diagnosis present

## 2023-08-28 DIAGNOSIS — Z79899 Other long term (current) drug therapy: Secondary | ICD-10-CM

## 2023-08-28 DIAGNOSIS — Z9884 Bariatric surgery status: Secondary | ICD-10-CM | POA: Diagnosis not present

## 2023-08-28 DIAGNOSIS — Z6841 Body Mass Index (BMI) 40.0 and over, adult: Secondary | ICD-10-CM

## 2023-08-28 DIAGNOSIS — Z880 Allergy status to penicillin: Secondary | ICD-10-CM

## 2023-08-28 DIAGNOSIS — K219 Gastro-esophageal reflux disease without esophagitis: Secondary | ICD-10-CM | POA: Diagnosis present

## 2023-08-28 DIAGNOSIS — R531 Weakness: Principal | ICD-10-CM

## 2023-08-28 DIAGNOSIS — Z7951 Long term (current) use of inhaled steroids: Secondary | ICD-10-CM

## 2023-08-28 DIAGNOSIS — K7581 Nonalcoholic steatohepatitis (NASH): Secondary | ICD-10-CM | POA: Diagnosis present

## 2023-08-28 DIAGNOSIS — I480 Paroxysmal atrial fibrillation: Secondary | ICD-10-CM | POA: Diagnosis present

## 2023-08-28 DIAGNOSIS — E785 Hyperlipidemia, unspecified: Secondary | ICD-10-CM | POA: Diagnosis present

## 2023-08-28 DIAGNOSIS — R110 Nausea: Secondary | ICD-10-CM | POA: Diagnosis present

## 2023-08-28 DIAGNOSIS — E440 Moderate protein-calorie malnutrition: Secondary | ICD-10-CM | POA: Diagnosis present

## 2023-08-28 DIAGNOSIS — E876 Hypokalemia: Principal | ICD-10-CM | POA: Diagnosis present

## 2023-08-28 DIAGNOSIS — R202 Paresthesia of skin: Secondary | ICD-10-CM

## 2023-08-28 DIAGNOSIS — K7460 Unspecified cirrhosis of liver: Secondary | ICD-10-CM | POA: Diagnosis present

## 2023-08-28 DIAGNOSIS — E662 Morbid (severe) obesity with alveolar hypoventilation: Secondary | ICD-10-CM | POA: Diagnosis present

## 2023-08-28 DIAGNOSIS — Z91048 Other nonmedicinal substance allergy status: Secondary | ICD-10-CM

## 2023-08-28 DIAGNOSIS — Z96611 Presence of right artificial shoulder joint: Secondary | ICD-10-CM | POA: Diagnosis present

## 2023-08-28 DIAGNOSIS — E860 Dehydration: Secondary | ICD-10-CM | POA: Diagnosis present

## 2023-08-28 DIAGNOSIS — Z8542 Personal history of malignant neoplasm of other parts of uterus: Secondary | ICD-10-CM | POA: Diagnosis not present

## 2023-08-28 DIAGNOSIS — K2970 Gastritis, unspecified, without bleeding: Secondary | ICD-10-CM | POA: Diagnosis present

## 2023-08-28 DIAGNOSIS — C541 Malignant neoplasm of endometrium: Secondary | ICD-10-CM | POA: Diagnosis present

## 2023-08-28 DIAGNOSIS — Z91041 Radiographic dye allergy status: Secondary | ICD-10-CM

## 2023-08-28 LAB — COMPREHENSIVE METABOLIC PANEL
ALT: 10 U/L (ref 0–44)
AST: 16 U/L (ref 15–41)
Albumin: 2.9 g/dL — ABNORMAL LOW (ref 3.5–5.0)
Alkaline Phosphatase: 53 U/L (ref 38–126)
Anion gap: 18 — ABNORMAL HIGH (ref 5–15)
BUN: 22 mg/dL (ref 8–23)
CO2: 21 mmol/L — ABNORMAL LOW (ref 22–32)
Calcium: 6.2 mg/dL — CL (ref 8.9–10.3)
Chloride: 98 mmol/L (ref 98–111)
Creatinine, Ser: 1.46 mg/dL — ABNORMAL HIGH (ref 0.44–1.00)
GFR, Estimated: 37 mL/min — ABNORMAL LOW (ref >60)
Glucose, Bld: 119 mg/dL — ABNORMAL HIGH (ref 70–99)
Potassium: 2.7 mmol/L — CL (ref 3.5–5.1)
Sodium: 137 mmol/L (ref 135–145)
Total Bilirubin: 1.9 mg/dL — ABNORMAL HIGH (ref 0.0–1.2)
Total Protein: 6.5 g/dL (ref 6.5–8.1)

## 2023-08-28 LAB — CBC WITH DIFFERENTIAL/PLATELET
Abs Immature Granulocytes: 0 10*3/uL (ref 0.00–0.07)
Basophils Absolute: 0 10*3/uL (ref 0.0–0.1)
Basophils Relative: 0 %
Eosinophils Absolute: 0 10*3/uL (ref 0.0–0.5)
Eosinophils Relative: 0 %
HCT: 38.5 % (ref 36.0–46.0)
Hemoglobin: 13.2 g/dL (ref 12.0–15.0)
Lymphocytes Relative: 12 %
Lymphs Abs: 1.6 10*3/uL (ref 0.7–4.0)
MCH: 31.1 pg (ref 26.0–34.0)
MCHC: 34.3 g/dL (ref 30.0–36.0)
MCV: 90.6 fL (ref 80.0–100.0)
Monocytes Absolute: 0.5 10*3/uL (ref 0.1–1.0)
Monocytes Relative: 4 %
Neutro Abs: 11.3 10*3/uL — ABNORMAL HIGH (ref 1.7–7.7)
Neutrophils Relative %: 84 %
Platelets: 295 10*3/uL (ref 150–400)
RBC: 4.25 MIL/uL (ref 3.87–5.11)
RDW: 12.4 % (ref 11.5–15.5)
WBC: 13.4 10*3/uL — ABNORMAL HIGH (ref 4.0–10.5)
nRBC: 0 % (ref 0.0–0.2)
nRBC: 0 /100{WBCs}

## 2023-08-28 LAB — RESP PANEL BY RT-PCR (RSV, FLU A&B, COVID)  RVPGX2
Influenza A by PCR: NEGATIVE
Influenza B by PCR: NEGATIVE
Resp Syncytial Virus by PCR: NEGATIVE
SARS Coronavirus 2 by RT PCR: NEGATIVE

## 2023-08-28 LAB — MAGNESIUM: Magnesium: 0.6 mg/dL — CL (ref 1.7–2.4)

## 2023-08-28 LAB — TROPONIN I (HIGH SENSITIVITY)
Troponin I (High Sensitivity): 25 ng/L — ABNORMAL HIGH (ref <18)
Troponin I (High Sensitivity): 30 ng/L — ABNORMAL HIGH (ref <18)

## 2023-08-28 MED ORDER — MORPHINE SULFATE (PF) 4 MG/ML IV SOLN
4.0000 mg | Freq: Once | INTRAVENOUS | Status: AC
  Administered 2023-08-28: 4 mg via INTRAVENOUS
  Filled 2023-08-28: qty 1

## 2023-08-28 MED ORDER — ONDANSETRON HCL 4 MG/2ML IJ SOLN
4.0000 mg | Freq: Once | INTRAMUSCULAR | Status: AC
  Administered 2023-08-28: 4 mg via INTRAVENOUS
  Filled 2023-08-28: qty 2

## 2023-08-28 MED ORDER — ONDANSETRON HCL 4 MG PO TABS: 4.0000 mg | ORAL_TABLET | Freq: Four times a day (QID) | ORAL | Status: DC | PRN

## 2023-08-28 MED ORDER — MONTELUKAST SODIUM 5 MG PO CHEW
5.0000 mg | CHEWABLE_TABLET | Freq: Every day | ORAL | Status: DC
Start: 2023-08-28 — End: 2023-09-01
  Administered 2023-08-28 – 2023-08-31 (×4): 5 mg via ORAL
  Filled 2023-08-28 (×5): qty 1

## 2023-08-28 MED ORDER — POTASSIUM CHLORIDE 10 MEQ/100ML IV SOLN
10.0000 meq | INTRAVENOUS | Status: AC
  Administered 2023-08-28 (×3): 10 meq via INTRAVENOUS
  Filled 2023-08-28 (×3): qty 100

## 2023-08-28 MED ORDER — ALBUTEROL SULFATE (2.5 MG/3ML) 0.083% IN NEBU: 2.5000 mg | INHALATION_SOLUTION | RESPIRATORY_TRACT | Status: DC | PRN

## 2023-08-28 MED ORDER — SODIUM CHLORIDE 0.9 % IV SOLN: INTRAVENOUS | Status: DC

## 2023-08-28 MED ORDER — METOPROLOL TARTRATE 25 MG PO TABS: 12.5000 mg | ORAL_TABLET | Freq: Two times a day (BID) | ORAL | Status: DC

## 2023-08-28 MED ORDER — POTASSIUM CHLORIDE IN NACL 40-0.9 MEQ/L-% IV SOLN
INTRAVENOUS | Status: DC
  Filled 2023-08-28 (×3): qty 1000

## 2023-08-28 MED ORDER — LACTATED RINGERS IV SOLN: INTRAVENOUS | Status: DC

## 2023-08-28 MED ORDER — ROSUVASTATIN CALCIUM 20 MG PO TABS
20.0000 mg | ORAL_TABLET | Freq: Every day | ORAL | Status: DC
  Administered 2023-08-28 – 2023-09-01 (×5): 20 mg via ORAL
  Filled 2023-08-28 (×5): qty 1

## 2023-08-28 MED ORDER — METOPROLOL TARTRATE 25 MG PO TABS
12.5000 mg | ORAL_TABLET | Freq: Every day | ORAL | Status: DC
  Administered 2023-08-28 – 2023-08-31 (×3): 12.5 mg via ORAL
  Filled 2023-08-28 (×4): qty 1

## 2023-08-28 MED ORDER — MAGNESIUM SULFATE 2 GM/50ML IV SOLN
2.0000 g | Freq: Once | INTRAVENOUS | Status: AC
Start: 2023-08-28 — End: 2023-08-28
  Administered 2023-08-28: 2 g via INTRAVENOUS
  Filled 2023-08-28: qty 50

## 2023-08-28 MED ORDER — MOMETASONE FURO-FORMOTEROL FUM 200-5 MCG/ACT IN AERO
2.0000 | INHALATION_SPRAY | Freq: Two times a day (BID) | RESPIRATORY_TRACT | Status: DC
  Administered 2023-08-29 – 2023-09-01 (×6): 2 via RESPIRATORY_TRACT
  Filled 2023-08-28: qty 8.8

## 2023-08-28 MED ORDER — ENOXAPARIN SODIUM 80 MG/0.8ML IJ SOSY
70.0000 mg | PREFILLED_SYRINGE | INTRAMUSCULAR | Status: DC
  Administered 2023-08-28 – 2023-08-31 (×4): 70 mg via SUBCUTANEOUS
  Filled 2023-08-28 (×3): qty 0.8
  Filled 2023-08-28: qty 0.7
  Filled 2023-08-28: qty 0.8

## 2023-08-28 MED ORDER — ALBUTEROL SULFATE HFA 108 (90 BASE) MCG/ACT IN AERS: 1.0000 | INHALATION_SPRAY | RESPIRATORY_TRACT | Status: DC | PRN

## 2023-08-28 MED ORDER — POTASSIUM CHLORIDE CRYS ER 20 MEQ PO TBCR
40.0000 meq | EXTENDED_RELEASE_TABLET | Freq: Once | ORAL | Status: AC
  Administered 2023-08-28: 40 meq via ORAL
  Filled 2023-08-28: qty 2

## 2023-08-28 MED ORDER — CALCIUM CARBONATE ANTACID 500 MG PO CHEW
400.0000 mg | CHEWABLE_TABLET | Freq: Once | ORAL | Status: AC
  Administered 2023-08-28: 400 mg via ORAL
  Filled 2023-08-28: qty 2

## 2023-08-28 MED ORDER — FLUOXETINE HCL 20 MG PO CAPS
40.0000 mg | ORAL_CAPSULE | Freq: Every morning | ORAL | Status: DC
  Administered 2023-08-29 – 2023-09-01 (×4): 40 mg via ORAL
  Filled 2023-08-28 (×4): qty 2

## 2023-08-28 MED ORDER — BUPROPION HCL ER (XL) 150 MG PO TB24
150.0000 mg | ORAL_TABLET | Freq: Every evening | ORAL | Status: DC
  Administered 2023-08-28 – 2023-08-31 (×4): 150 mg via ORAL
  Filled 2023-08-28 (×4): qty 1

## 2023-08-28 MED ORDER — ADULT MULTIVITAMIN W/MINERALS CH
1.0000 | ORAL_TABLET | Freq: Every day | ORAL | Status: DC
  Administered 2023-08-28 – 2023-09-01 (×5): 1 via ORAL
  Filled 2023-08-28 (×5): qty 1

## 2023-08-28 MED ORDER — ONDANSETRON HCL 4 MG/2ML IJ SOLN
4.0000 mg | Freq: Four times a day (QID) | INTRAMUSCULAR | Status: DC | PRN
  Administered 2023-08-29: 4 mg via INTRAVENOUS
  Filled 2023-08-28: qty 2

## 2023-08-28 MED ORDER — VITAMIN D 25 MCG (1000 UNIT) PO TABS
2000.0000 [IU] | ORAL_TABLET | Freq: Every day | ORAL | Status: DC
  Administered 2023-08-28 – 2023-09-01 (×5): 2000 [IU] via ORAL
  Filled 2023-08-28 (×5): qty 2

## 2023-08-28 MED ORDER — LACTATED RINGERS IV BOLUS
1000.0000 mL | Freq: Once | INTRAVENOUS | Status: AC
  Administered 2023-08-28: 1000 mL via INTRAVENOUS

## 2023-08-28 MED ORDER — MAGNESIUM SULFATE 2 GM/50ML IV SOLN
2.0000 g | Freq: Once | INTRAVENOUS | Status: AC
  Administered 2023-08-28: 2 g via INTRAVENOUS
  Filled 2023-08-28: qty 50

## 2023-08-28 MED ORDER — SODIUM CHLORIDE 0.9 % IV SOLN: 0.5000 mg/kg/h | INTRAVENOUS | Status: DC
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
## 2023-08-28 NOTE — ED Notes (Signed)
6N unit secretary notified of patient transport to unit.

## 2023-08-28 NOTE — ED Triage Notes (Signed)
Woke up yesterdaY with bilateral facial numbness and right foot leg numbness.  Recent back injury per patient.

## 2023-08-28 NOTE — ED Provider Notes (Signed)
Care of patient received from prior provider at 5:48 PM, please see their note for complete H/P and care plan.  Received handoff per ED course.  Clinical Course as of 08/28/23 1748  Wed Aug 28, 2023  1620 Stable  58 YOF with neurologic symptoms. Tingling and msk weakness.  Hx of SBO/Gastric bypass. Multiple electrolytes down being replaced On hydrochlorothiazide and Losartan  [CC]    Clinical Course User Index [CC] Glyn Ade, MD   Disposition:   Based on the above findings, I believe this patient is stable for admission.    Patient/family educated about specific findings on our evaluation and explained exact reasons for admission.  Patient/family educated about clinical situation and time was allowed to answer questions.   Admission team communicated with and agreed with need for admission. Patient admitted. Patient ready to move at this time.     Emergency Department Medication Summary:   Medications  potassium chloride 10 mEq in 100 mL IVPB (10 mEq Intravenous New Bag/Given 08/28/23 1712)  lactated ringers infusion ( Intravenous New Bag/Given 08/28/23 1551)  lactated ringers bolus 1,000 mL (0 mLs Intravenous Stopped 08/28/23 1458)  ondansetron (ZOFRAN) injection 4 mg (4 mg Intravenous Given 08/28/23 1219)  magnesium sulfate IVPB 2 g 50 mL (0 g Intravenous Stopped 08/28/23 1543)  calcium carbonate (TUMS - dosed in mg elemental calcium) chewable tablet 400 mg of elemental calcium (400 mg of elemental calcium Oral Given 08/28/23 1550)  morphine (PF) 4 MG/ML injection 4 mg (4 mg Intravenous Given 08/28/23 1548)          Glyn Ade, MD 08/28/23 1749

## 2023-08-28 NOTE — ED Notes (Signed)
Patient having generalized weakness on neuro exam, bilateral, not focal.

## 2023-08-28 NOTE — ED Provider Notes (Addendum)
[place] EMERGENCY DEPARTMENT AT [facility] Provider Note   CSN: [id number] Arrival date & time: 08/28/23  1122     History  Chief Complaint  Patient presents with   Neurologic Problem    Danielle Stephenson is a 78 y.o. female.  Patient is a 78 year old female with a history of diabetes, hypertension, GERD, recent injection in her lumbar spine for a ruptured disc at the beginning of January,hepatic steatosis endometrial cancer with a history of bariatric surgery, history of diverticulitis that required surgical intervention with wound dehiscence leading to a partial hemicolectomy, and prior SBO who is presenting today with multiple complaints.  Patient reports for the last 3 weeks after changing blood pressure medications she has been having ongoing nausea with occasional vomiting and poor oral intake.  In the last few days to week she has noticed recurrence of her lower back pain and symptoms radiating down her left leg.  She reports all of this had resolved after getting the steroid injection at the beginning of January.  However in the last 24 hours she started noticing a numb tingling sensation in her face which has been more persistent on the left side but she also feels that intermittently on the right side as well as the same sensations in Biot lateral arms and legs.  Today she was able to get out of bed and go to the bathroom but after sitting down on the toilet she was not able to get up and her family member had to help get her up.  She complains of ongoing nausea but denies any diarrhea or abdominal pain.  She denies chest pain or shortness of breath.  He has not noticed any leg swelling.  The history is provided by the patient.  Neurologic Problem       Home Medications Prior to Admission medications   Medication Sig Start Date End Date Taking? Authorizing Provider  acetaminophen (TYLENOL) 500 MG tablet Take 1,000 mg by mouth as needed.    [provider]   albuterol (VENTOLIN HFA) 108 (90 Base) MCG/ACT inhaler Inhale 1-2 puffs into the lungs every 4 (four) hours as needed for wheezing.     [provider]  amLODipine (NORVASC) 10 MG tablet Take 10 mg by mouth daily. 07/17/17   [provider]  buPROPion (WELLBUTRIN XL) 150 MG 24 hr tablet Take 1 tablet by mouth daily. 06/28/23   [provider]  carvedilol (COREG) 12.5 MG tablet Take 1 tablet by mouth in the morning and at bedtime.  10/15/19   [provider]  Cholecalciferol (VITAMIN D) 2000 UNITS tablet Take 2,000 Units by mouth daily.    [provider]  dexamethasone (DECADRON) 4 MG tablet Take 1 tablet (4 mg total) by mouth 2 (two) times daily with a meal. 06/16/23   Terrilee Files, MD  FLUoxetine (PROZAC) 40 MG capsule Take 40 mg by mouth daily. 01/08/22   [provider]  Fluticasone-Salmeterol (ADVAIR) 500-50 MCG/DOSE AEPB Inhale 1 puff into the lungs daily.    [provider]  hydrochlorothiazide (HYDRODIURIL) 25 MG tablet Take 1 tablet (25 mg total) by mouth daily. 07/09/23   Marinda Elk, MD  levonorgestrel (MIRENA) 20 MCG/24HR IUD 1 Intra Uterine Device (1 each total) by Intrauterine route once for 1 dose. To be placed in OR Patient taking differently: 1 each by Intrauterine route continuous. 06/05/19 07/01/23  Warner Mccreedy D, NP  losartan (COZAAR) 100 MG tablet Take 1 tablet (100 mg  total) by mouth daily. 07/11/23   Marinda Elk, MD  metoprolol tartrate (LOPRESSOR) 25 MG tablet Take 0.5 tablets (12.5 mg total) by mouth 2 (two) times daily. 07/04/23   Marinda Elk, MD  montelukast (SINGULAIR) 5 MG chewable tablet Chew 5 mg by mouth daily. 04/28/19   [provider]  Multiple Vitamins-Minerals (MULTIVITAMIN WITH MINERALS) tablet Take 1 tablet by mouth daily.    [provider]  oxyCODONE-acetaminophen (PERCOCET) 5-325 MG tablet Take 1 tablet by mouth every 6 (six) hours as needed. 06/16/23    Terrilee Files, MD  pantoprazole (PROTONIX) 40 MG tablet Take 1 tablet (40 mg total) by mouth daily before breakfast. 07/04/23   Marinda Elk, MD  rosuvastatin (CRESTOR) 20 MG tablet Take 20 mg by mouth daily.    [provider]      Allergies    Penicillins, Ciprofloxacin, Iodine, Demerol [meperidine], and Tape    Review of Systems   Review of Systems  Physical Exam Updated Vital Signs BP 135/67   Pulse 74   Temp 99 F (37.2 C) (Oral)   Resp (!) 26   Ht 5\' 5"  (1.651 m)   Wt (!) 147.9 kg   SpO2 90%   BMI 54.25 kg/m  Physical Exam Vitals and nursing note reviewed.  Constitutional:      General: She is not in acute distress.    Appearance: She is well-developed.  HENT:     Head: Normocephalic and atraumatic.     Mouth/Throat:     Mouth: Mucous membranes are dry.  Eyes:     Pupils: Pupils are equal, round, and reactive to light.  Cardiovascular:     Rate and Rhythm: Normal rate and regular rhythm.     Heart sounds: Normal heart sounds. No murmur heard.    No friction rub.  Pulmonary:     Effort: Pulmonary effort is normal.     Breath sounds: Normal breath sounds. No wheezing or rales.  Abdominal:     General: Bowel sounds are normal. There is no distension.     Palpations: Abdomen is soft.     Tenderness: There is no abdominal tenderness. There is no guarding or rebound.  Musculoskeletal:        General: No tenderness. Normal range of motion.     Right lower leg: No edema.     Left lower leg: No edema.     Comments: No edema.  Lumbar spine tenderness  Skin:    General: Skin is warm and dry.     Findings: No rash.  Neurological:     Mental Status: She is alert and oriented to person, place, and time.     Cranial Nerves: No cranial nerve deficit.     Comments: When she smiles mouth moves symmetrically, symmetric eyebrow raise.  Speech is normal.  Mild pronator drift in bilateral upper extremities with 4 out of 5 strength bilaterally.  Mild  pronator drift in bilateral lower extremities with 4 out of 5 strength bilaterally.  Sensation is intact throughout  Psychiatric:        Behavior: Behavior normal.     ED Results / Procedures / Treatments   Labs (all labs ordered are listed, but only abnormal results are displayed) Labs Reviewed  CBC WITH DIFFERENTIAL/PLATELET - Abnormal; Notable for the following components:      Result Value   WBC 13.4 (*)    Neutro Abs 11.3 (*)    All other components within normal  limits  COMPREHENSIVE METABOLIC PANEL - Abnormal; Notable for the following components:   Potassium 2.7 (*)    CO2 21 (*)    Glucose, Bld 119 (*)    Creatinine, Ser 1.46 (*)    Calcium 6.2 (*)    Albumin 2.9 (*)    Total Bilirubin 1.9 (*)    GFR, Estimated 37 (*)    Anion gap 18 (*)    All other components within normal limits  MAGNESIUM - Abnormal; Notable for the following components:   Magnesium 0.6 (*)    All other components within normal limits  TROPONIN I (HIGH SENSITIVITY) - Abnormal; Notable for the following components:   Troponin I (High Sensitivity) 25 (*)    All other components within normal limits  TROPONIN I (HIGH SENSITIVITY) - Abnormal; Notable for the following components:   Troponin I (High Sensitivity) 30 (*)    All other components within normal limits  RESP PANEL BY RT-PCR (RSV, FLU A&B, COVID)  [id number]    EKG EKG Interpretation Date/Time:  Wednesday August 28 2023 12:45:10 EST Ventricular Rate:  69 PR Interval:  122 QRS Duration:  136 QT Interval:  476 QTC Calculation: 510 R Axis:   -33  Text Interpretation: Atrial fibrillation , pvc Left axis deviation Left bundle branch block No significant change since last tracing When compared with ECG of 01-Jul-2023 16:39, PREVIOUS ECG IS PRESENT Confirmed by Gwyneth Sprout ([id number]) on 08/28/2023 1:45:47 PM  Radiology CT Head Wo Contrast Result Date: 08/28/2023 CLINICAL DATA:  Provided history: Mental status change, unknown cause.  Additional history provided: Bilateral facial numbness, right leg/foot numbness. Recent back injury. Generalized weakness on neuro exam. EXAM: CT HEAD WITHOUT CONTRAST TECHNIQUE: Contiguous axial images were obtained from the base of the skull through the vertex without intravenous contrast. RADIATION DOSE REDUCTION: This exam was performed according to the departmental dose-optimization program which includes automated exposure control, adjustment of the mA and/or kV according to patient size and/or use of iterative reconstruction technique. COMPARISON:  Head CT 05/01/2022.  Brain MRI 11/25/2014. FINDINGS: Streak/beam hardening artifact arising from dental restoration obscures portions of the supratentorial brain. Within this limitation, findings are as follows. Brain: Mild-to-moderate patchy and ill-defined hypoattenuation within the cerebral white matter, nonspecific but compatible with chronic small vessel disease. Cavum septum pellucidum and cavum vergae (anatomic variant). Partially empty sella turcica. There is no acute intracranial hemorrhage. No demarcated cortical infarct. No extra-axial fluid collection. No evidence of an intracranial mass. No midline shift. Vascular: No hyperdense vessel.  Atherosclerotic calcifications. Skull: No calvarial fracture or aggressive osseous lesion. Sinuses/Orbits: No mass or acute finding within the imaged orbits. Mild mucosal thickening within the left maxillary sinus. Mild mucosal thickening within the left maxillary and bilateral sphenoid sinuses. Other: Nasal septal defect. IMPRESSION: 1. Streak/beam hardening artifact arising from dental restoration obscures portions of the supratentorial brain. 2. Within this limitation, there is no evidence of an acute intracranial abnormality. 3. Mild-to-moderate chronic small vessel ischemic changes within the cerebral white matter. 4. Mild paranasal sinus mucosal thickening. 5. Nasal septal defect. Electronically Signed   By: Jackey Loge D.O.   On: 08/28/2023 14:32    Procedures Procedures    Medications Ordered in ED Medications  potassium chloride 10 mEq in 100 mL IVPB (10 mEq Intravenous New Bag/Given 08/28/23 1433)  calcium carbonate (TUMS - dosed in mg elemental calcium) chewable tablet 400 mg of elemental calcium (has no administration in time range)  morphine (PF) 4 MG/ML injection 4 mg (  has no administration in time range)  lactated ringers bolus 1,000 mL (0 mLs Intravenous Stopped 08/28/23 1458)  ondansetron (ZOFRAN) injection 4 mg (4 mg Intravenous Given 08/28/23 1219)  magnesium sulfate IVPB 2 g 50 mL (2 g Intravenous New Bag/Given 08/28/23 1435)    ED Course/ Medical Decision Making/ A&P                                 Medical Decision Making Amount and/or Complexity of Data Reviewed External Data Reviewed: notes. Labs: ordered. Decision-making details documented in ED Course. Radiology: ordered and independent interpretation performed. Decision-making details documented in ED Course. ECG/medicine tests: ordered and independent interpretation performed. Decision-making details documented in ED Course.  Risk OTC drugs. Prescription drug management. Decision regarding hospitalization.   Pt with multiple medical problems and comorbidities and presenting today with a complaint that caries a high risk for morbidity and mortality.  Here today with multiple complaints but mostly of generalized weakness with numbness and tingling from head to toe.  This is also in the setting of nausea and vomiting for 3 weeks.  Patient's symptoms seem to be symmetric although she does report they are worse on the left.  Concern for electrolyte abnormality, dehydration, lower suspicion for stroke or brain mass.  Low suspicion for anemia.  Patient does have a temperature today of 99 and possible infectious etiology as well.  She has some back pain but not excruciating back pain more consistent with infectious etiology.   However she did have injections done a little over a month ago.  She is neurologically intact here although has some mild pronator drift in all 4 extremities.  No notable facial weakness at this time.  Patient given IV fluids and antiemetics.  Workup is pending.  3:40 PM I independently interpreted patient's EKG and labs.  EKG with atrial fibrillation with a left bundle branch block which is unchanged from prior, CBC with a white count of 13.4 today within normal hemoglobin, CMP showing new hypokalemia of 2.7, mild AKI with creatinine of 1.46 from her baseline of 1 or below as well as a calcium of 6.2 and an anion gap of 18.  Also magnesium of 0.6 and a mild elevated troponin of 25 with repeat at 30.  Viral panel was negative.  I have independently visualized and interpreted pt's images today.  Head CT without acute findings.  CXR with poor expiratory film but no obvious abnormalities.  On repeat exam patient is complaining of significant pain in her right foot and some swelling.  On closer inspection patient does have ecchymosis and erythema with mild warmth and bony tenderness.  Will get an x-ray to ensure no fracture. Low suspicion for celluitis.  Possible gout. 3:40 PM Plain film of floot without obvious fracture.  Will admit for above issues.  Pt given potassium, magnesium and calcium replacement.  Hospitalist consulted for admission.  Pt and family are comfortable with this plan. CRITICAL CARE Performed by: Karandeep Resende Total critical care time: 30 minutes Critical care time was exclusive of separately billable procedures and treating other patients. Critical care was necessary to treat or prevent imminent or life-threatening deterioration. Critical care was time spent personally by me on the following activities: development of treatment plan with patient and/or surrogate as well as nursing, discussions with consultants, evaluation of patient's response to treatment, examination of patient,  obtaining history from patient or surrogate, ordering and performing treatments  and interventions, ordering and review of laboratory studies, ordering and review of radiographic studies, pulse oximetry and re-evaluation of patient's condition.         Final Clinical Impression(s) / ED Diagnoses Final diagnoses:  Weakness  Paresthesias  Hypokalemia  Hypomagnesemia  Hypocalcemia  AKI (acute kidney injury) (HCC)  Dehydration    Rx / DC Orders ED Discharge Orders     None         Gwyneth Sprout, MD 08/28/23 1541    Gwyneth Sprout, MD 08/28/23 1543

## 2023-08-29 LAB — CBC
HCT: 34.7 % — ABNORMAL LOW (ref 36.0–46.0)
Hemoglobin: 12 g/dL (ref 12.0–15.0)
MCH: 31.1 pg (ref 26.0–34.0)
MCHC: 34.6 g/dL (ref 30.0–36.0)
MCV: 89.9 fL (ref 80.0–100.0)
Platelets: 272 10*3/uL (ref 150–400)
RBC: 3.86 MIL/uL — ABNORMAL LOW (ref 3.87–5.11)
RDW: 12.5 % (ref 11.5–15.5)
WBC: 9.7 10*3/uL (ref 4.0–10.5)
nRBC: 0 % (ref 0.0–0.2)

## 2023-08-29 LAB — COMPREHENSIVE METABOLIC PANEL
ALT: 10 U/L (ref 0–44)
AST: 14 U/L — ABNORMAL LOW (ref 15–41)
Albumin: 2.6 g/dL — ABNORMAL LOW (ref 3.5–5.0)
Alkaline Phosphatase: 47 U/L (ref 38–126)
Anion gap: 18 — ABNORMAL HIGH (ref 5–15)
BUN: 18 mg/dL (ref 8–23)
CO2: 24 mmol/L (ref 22–32)
Calcium: 6.7 mg/dL — ABNORMAL LOW (ref 8.9–10.3)
Chloride: 98 mmol/L (ref 98–111)
Creatinine, Ser: 1.19 mg/dL — ABNORMAL HIGH (ref 0.44–1.00)
GFR, Estimated: 48 mL/min — ABNORMAL LOW (ref >60)
Glucose, Bld: 109 mg/dL — ABNORMAL HIGH (ref 70–99)
Potassium: 2.8 mmol/L — ABNORMAL LOW (ref 3.5–5.1)
Sodium: 140 mmol/L (ref 135–145)
Total Bilirubin: 1.8 mg/dL — ABNORMAL HIGH (ref 0.0–1.2)
Total Protein: 5.7 g/dL — ABNORMAL LOW (ref 6.5–8.1)

## 2023-08-29 LAB — VITAMIN D 25 HYDROXY (VIT D DEFICIENCY, FRACTURES): Vit D, 25-Hydroxy: 37.49 ng/mL (ref 30–100)

## 2023-08-29 LAB — MAGNESIUM: Magnesium: 1.4 mg/dL — ABNORMAL LOW (ref 1.7–2.4)

## 2023-08-29 LAB — TSH: TSH: 1.293 u[IU]/mL (ref 0.350–4.500)

## 2023-08-29 LAB — URIC ACID: Uric Acid, Serum: 9.5 mg/dL — ABNORMAL HIGH (ref 2.5–7.1)

## 2023-08-29 MED ORDER — ENSURE ENLIVE PO LIQD
237.0000 mL | Freq: Two times a day (BID) | ORAL | Status: DC
Start: 2023-08-29 — End: 2023-09-01
  Administered 2023-08-30 – 2023-09-01 (×4): 237 mL via ORAL

## 2023-08-29 MED ORDER — KETOROLAC TROMETHAMINE 15 MG/ML IJ SOLN
15.0000 mg | Freq: Once | INTRAMUSCULAR | Status: AC
Start: 2023-08-29 — End: 2023-08-29
  Administered 2023-08-29: 15 mg via INTRAVENOUS
  Filled 2023-08-29: qty 1

## 2023-08-29 MED ORDER — MAGNESIUM SULFATE 4 GM/100ML IV SOLN
4.0000 g | Freq: Once | INTRAVENOUS | Status: AC
  Administered 2023-08-29: 4 g via INTRAVENOUS
  Filled 2023-08-29: qty 100

## 2023-08-29 MED ORDER — ACETAMINOPHEN 500 MG PO TABS
1000.0000 mg | ORAL_TABLET | Freq: Once | ORAL | Status: AC
  Administered 2023-08-29: 1000 mg via ORAL
  Filled 2023-08-29: qty 2

## 2023-08-29 MED ORDER — PANTOPRAZOLE SODIUM 40 MG IV SOLR
40.0000 mg | INTRAVENOUS | Status: AC
Start: 2023-08-29 — End: 2023-08-30
  Administered 2023-08-29 – 2023-08-30 (×2): 40 mg via INTRAVENOUS
  Filled 2023-08-29 (×2): qty 10

## 2023-08-29 MED ORDER — THIAMINE MONONITRATE 100 MG PO TABS
100.0000 mg | ORAL_TABLET | Freq: Every day | ORAL | Status: DC
Start: 2023-08-29 — End: 2023-09-01
  Administered 2023-08-29 – 2023-09-01 (×4): 100 mg via ORAL
  Filled 2023-08-29 (×4): qty 1

## 2023-08-29 MED ORDER — POTASSIUM CHLORIDE CRYS ER 20 MEQ PO TBCR
40.0000 meq | EXTENDED_RELEASE_TABLET | ORAL | Status: AC
  Administered 2023-08-29 (×4): 40 meq via ORAL
  Filled 2023-08-29 (×4): qty 2

## 2023-08-29 MED ORDER — CALCIUM CARBONATE ANTACID 500 MG PO CHEW
400.0000 mg | CHEWABLE_TABLET | Freq: Three times a day (TID) | ORAL | Status: DC
  Administered 2023-08-29 – 2023-09-01 (×9): 400 mg via ORAL
  Filled 2023-08-29 (×9): qty 2

## 2023-08-29 NOTE — Progress Notes (Signed)
   08/29/23 0812  Vitals  Temp 98 F (36.7 C)  Temp Source Oral  BP (!) 109/47  MAP (mmHg) (!) 64  BP Location Left Arm  BP Method Automatic  Patient Position (if appropriate) Lying  Pulse Rate 72  Pulse Rate Source Monitor  Resp 17  MEWS COLOR  MEWS Score Color Green  Oxygen Therapy  SpO2 91 %  O2 Device Room Air  MEWS Score  MEWS Temp 0  MEWS Systolic 0  MEWS Pulse 0  MEWS RR 0  MEWS LOC 0  MEWS Score 0

## 2023-08-29 NOTE — Progress Notes (Signed)
Breakfast tray ordered for pt. Pt put on auto trays per request.

## 2023-08-29 NOTE — Plan of Care (Signed)
  Problem: Health Behavior/Discharge Planning: Goal: Ability to manage health-related needs will improve Outcome: Progressing   Problem: Activity: Goal: Risk for activity intolerance will decrease Outcome: Progressing   Problem: Nutrition: Goal: Adequate nutrition will be maintained Outcome: Progressing   Problem: Pain Managment: Goal: General experience of comfort will improve and/or be controlled Outcome: Progressing   Problem: Safety: Goal: Ability to remain free from injury will improve Outcome: Progressing
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
## 2023-08-29 NOTE — Evaluation (Signed)
Physical Therapy Evaluation Patient Details Name: Danielle Stephenson MRN: [id number] DOB: 1945/12/09 Today's Date: 08/29/2023  History of Present Illness  Pt is a 78 y/o female presents to ED 2/12 with tingling in arms, legs and face. Found to have hypomagnesemia, and hypokalemia and admitted PMHx, remote endometrial CA, OSA/OHS, prior gastric bypass, history of complicated diverticulitis with hemicolectomy, fatty liver/cirrhosis, gastritis and esophagitis, spinal injection  Clinical Impression  PTA pt was living in a town house with her daughter who works  in the evenings. Pt is independent in mobility in home, uses quad cane for support with community level ambualation and is independent with ADLs, and iADLs. Pt is presently limited in safe mobility by pain and swelling in her L foot causing mild instability and limping. Pt will not need any PT at discharge however PT will continue to follow acutely for RW training.       If plan is discharge home, recommend the following: A little help with walking and/or transfers;Assist for transportation;Help with stairs or ramp for entrance   Can travel by private vehicle    Yes    Equipment Recommendations Rolling walker (2 wheels);BSC/3in1 (bariatric due for weight and body habitus)     Functional Status Assessment Patient has had a recent decline in their functional status and demonstrates the ability to make significant improvements in function in a reasonable and predictable amount of time.     Precautions / Restrictions Precautions Precautions: Fall Precaution/Restrictions Comments: 9/24 fall Restrictions Weight Bearing Restrictions Per Provider Order: No      Mobility  Bed Mobility Overal bed mobility: Modified Independent             General bed mobility comments: minor use of bed rail    Transfers Overall transfer level: Modified independent Equipment used: None               General transfer comment: good power up and  self steady, EoB, no dizziness or lightheadedness    Ambulation/Gait Ambulation/Gait assistance: Supervision Gait Distance (Feet): 20 Feet Assistive device: None Gait Pattern/deviations: Decreased weight shift to left, Decreased stance time - left, Step-through pattern, Decreased step length - right, Shuffle Gait velocity: slowed Gait velocity interpretation: <1.31 ft/sec, indicative of household ambulator   General Gait Details: pt with L foot pain causing limp with ambulation, with increasing instability, only Youth RW in room so had pt return to recliner      Balance Overall balance assessment: Mild deficits observed, not formally tested                                           Pertinent Vitals/Pain Pain Assessment Pain Assessment: No/denies pain    Home Living Family/patient expects to be discharged to:: Private residence Living Arrangements: Other relatives Available Help at Discharge: Family (daughter works in evenings) Type of Home: House Home Access: Stairs to enter   Secretary/administrator of Steps: 1   Home Layout: Two level;Bed/bath upstairs;1/2 bath on main level Home Equipment: Cane - quad      Prior Function Prior Level of Function : Independent/Modified Independent             Mobility Comments: uses quad cane in community since back pain started, independent in home ADLs Comments: Independent     Extremity/Trunk Assessment   Upper Extremity Assessment Upper Extremity Assessment: Overall WFL for tasks assessed  Lower Extremity Assessment Lower Extremity Assessment: LLE deficits/detail;Generalized weakness LLE Deficits / Details: L foot dorsum with edema and rubor, pt reporting pain with weightbearing and hx of gout    Cervical / Trunk Assessment Cervical / Trunk Assessment: Other exceptions Cervical / Trunk Exceptions: history of back pain, received injection 1/24  Communication   Communication Communication: No  apparent difficulties    Cognition Arousal: Alert Behavior During Therapy: WFL for tasks assessed/performed   PT - Cognitive impairments: No apparent impairments                         Following commands: Intact       Cueing Cueing Techniques: Verbal cues     General Comments General comments (skin integrity, edema, etc.): pt report she has a history of gout and she feels like she is having a flare up     Assessment/Plan    PT Assessment Patient needs continued PT services  PT Problem List Decreased activity tolerance;Decreased balance;Decreased mobility;Pain       PT Treatment Interventions DME instruction;Gait training;Stair training;Functional mobility training;Therapeutic activities;Therapeutic exercise;Balance training;Cognitive remediation;Patient/family education    PT Goals (Current goals can be found in the Care Plan section)  Acute Rehab PT Goals PT Goal Formulation: With patient Time For Goal Achievement: 09/12/23 Potential to Achieve Goals: Fair    Frequency Min 1X/week        AM-PAC PT "6 Clicks" Mobility  Outcome Measure Help needed turning from your back to your side while in a flat bed without using bedrails?: None Help needed moving from lying on your back to sitting on the side of a flat bed without using bedrails?: None Help needed moving to and from a bed to a chair (including a wheelchair)?: None Help needed standing up from a chair using your arms (e.g., wheelchair or bedside chair)?: None Help needed to walk in hospital room?: None Help needed climbing 3-5 steps with a railing? : A Little 6 Click Score: 23    End of Session   Activity Tolerance: Patient limited by pain Patient left: in chair;with call bell/phone within reach Nurse Communication: Mobility status;Other (comment) (use RW for safety) PT Visit Diagnosis: Unsteadiness on feet (R26.81);Other abnormalities of gait and mobility (R26.89);Pain Pain - Right/Left:  Left Pain - part of body: Ankle and joints of foot    Time: 1610-9604 PT Time Calculation (min) (ACUTE ONLY): 23 min   Charges:   PT Evaluation $PT Eval Low Complexity: 1 Low PT Treatments $Gait Training: 8-22 mins PT General Charges $$ ACUTE PT VISIT: 1 Visit         Dannis Deroche B. Beverely Risen PT, DPT Acute Rehabilitation Services Please use secure chat or  Call Office [phone]   [facility] 08/29/2023, 2:35 PM

## 2023-08-29 NOTE — Progress Notes (Signed)
Mobility Specialist Progress Note:   08/29/23 1146  Mobility  Activity Transferred from bed to chair  Level of Assistance Standby assist, set-up cues, supervision of patient - no hands on  Assistive Device None  Distance Ambulated (ft) 3 ft  Activity Response Tolerated well  Mobility Referral Yes  Mobility visit 1 Mobility  Mobility Specialist Start Time (ACUTE ONLY) 1045  Mobility Specialist Stop Time (ACUTE ONLY) 1055  Mobility Specialist Time Calculation (min) (ACUTE ONLY) 10 min   Pt received EOB, eager to transfer to chair. Pt c/o R foot pain during session limiting WB tolerance during ambulation. No physical assistance needed during transfer. Pt left in chair with call bell in reach and all needs met.   Leory Plowman  Mobility Specialist Please contact via [employer] office at [phone]

## 2023-08-29 NOTE — Progress Notes (Addendum)
PROGRESS NOTE    QUERIDA BERETTA  GNF:[id number] DOB: 1946-03-16 DOA: 08/28/2023 PCP: Stevphen Rochester, MD  [name] is a morbidly obese chronically ill female with history of remote endometrial CA, OSA/OHS, prior gastric bypass, history of complicated diverticulitis with hemicolectomy, fatty liver/cirrhosis, recently hospitalized with ileus versus small bowel obstruction suspected to be from adhesions, versus motility following recent back injury, this resolved with NG decompression bowel rest, she also had an endoscopy then which noted some gastritis and esophagitis.  Patient reports having a back injury in September preceding this which resulted in disc herniation, she saw neurosurgery in January and had spinal injection, subsequently reports starting on a blood pressure medicine, and since then has been having intermittent nausea and vomiting for few weeks.  Denied any abdominal pain, no diarrhea, has chronic semisolid stools.  Subsequently noticed tingling in her arms and legs, occasionally in her face, this progressively got worse and presented to the ED 2/12 ED Course: Vital signs stable labs noted severe hypokalemia, potassium 2.7, calcium 6.2, magnesium 0.6, troponin 25, WBC 13.4, respiratory panel by RT-PCR was negative, CT head unrevealing, chest x-ray unremarkable   Subjective: -Feels better overall, still weak, tingling improving  Assessment and Plan:  Hypocalcemia Hypokalemia Hypomagnesemia -Likely multifactorial, malnutrition, poor oral intake compounded by hydrochlorothiazide and PPI use and possibly malabsorption as well (prior gastric bypass) -Symptoms likely attributable to severe hypokalemia, hypomagnesemia and some hypocalcemia  -Replaced potassium p.o. and IV, repeat oral potassium -Repeat 4 g of IV mag today -Monitor on telemetry -Dietitian consult, DC hydrochlorothiazide and PPI at Discharge -Increase activity, PT OT  Hypocalcemia Corrected calcium for albumin is  7.8 today Follow-up PTH, check vitamin D - continue calcium carbonate 3 times daily   Subacute nausea and vomiting -Known GERD on endoscopy 12/24 -Also had ileus versus SBO which resolved with conservative management -Monitor clinically, PPI, supportive care -Abdominal exam is benign -Family thinks she was started on a new antihypertensive in the last few weeks preceding her GI symptoms, they are unsure which medication, will bring her list today to clarify   Paroxysmal A-fib -Transient in the setting of recent hospitalization -Not on anticoagulation, in sinus rhythm now, continue metoprolol, monitor on telemetry   NASH, cirrhosis -Needs diet, lifestyle modification   Type 2 diabetes mellitus -Recent A1c was 6.6   Hypertension -Blood pressure soft but stable, continue metoprolol, hold losartan and amlodipine   OSA -Resume CPAP    Morbid obesity with BMI of 50.0-59.9, adult (HCC) -Needs diet and lifestyle modification   Endometrial CA -Follow-up with GYN     DVT prophylaxis: Lovenox Code Status: DN R Family Communication: Patient and family at bedside yesterday Disposition Plan: Home likely 48 hours   Consultants:    Procedures:   Antimicrobials:    Objective: Vitals:   08/29/23 0055 08/29/23 0410 08/29/23 0812 08/29/23 1143  BP: (!) 98/43 (!) 118/58 (!) 109/47   Pulse: 65 72 72   Resp: 17 16 17    Temp: 98 F (36.7 C) 98 F (36.7 C) 98 F (36.7 C)   TempSrc: Oral Oral Oral   SpO2: 91% 95% 91%   Weight:    (!) 140.8 kg  Height:        Intake/Output Summary (Last 24 hours) at 08/29/2023 1152 Last data filed at 08/29/2023 1045 Gross per 24 hour  Intake 120 ml  Output --  Net 120 ml   Filed Weights   08/28/23 1124 08/29/23 1143  Weight: (!) 147.9 kg Marland Kitchen)  140.8 kg    Examination:  General exam: Obese chronically ill female sitting up in bed, AAOx3 HEENT: Neck obese unable to assess JVD CVS: S1-S2, regular rhythm Lungs: Decreased breath sounds at  the bases Abdomen: Soft, nontender, bowel sounds present Extremities: Trace edema Skin: No rashes Psychiatry:  Mood & affect appropriate.     Data Reviewed:   CBC: Recent Labs  Lab 08/28/23 1213 08/29/23 0528  WBC 13.4* 9.7  NEUTROABS 11.3*  --   HGB 13.2 12.0  HCT 38.5 34.7*  MCV 90.6 89.9  PLT 295 272   Basic Metabolic Panel: Recent Labs  Lab 08/28/23 1213 08/29/23 0528  NA 137 140  K 2.7* 2.8*  CL 98 98  CO2 21* 24  GLUCOSE 119* 109*  BUN 22 18  CREATININE 1.46* 1.19*  CALCIUM 6.2* 6.7*  MG 0.6* 1.4*   GFR: Estimated Creatinine Clearance: 58.4 mL/min (A) (by C-G formula based on SCr of 1.19 mg/dL (H)). Liver Function Tests: Recent Labs  Lab 08/28/23 1213 08/29/23 0528  AST 16 14*  ALT 10 10  ALKPHOS 53 47  BILITOT 1.9* 1.8*  PROT 6.5 5.7*  ALBUMIN 2.9* 2.6*   No results for input(s): "LIPASE", "AMYLASE" in the last 168 hours. No results for input(s): "AMMONIA" in the last 168 hours. Coagulation Profile: No results for input(s): "INR", "PROTIME" in the last 168 hours. Cardiac Enzymes: No results for input(s): "CKTOTAL", "CKMB", "CKMBINDEX", "TROPONINI" in the last 168 hours. BNP (last 3 results) No results for input(s): "PROBNP" in the last 8760 hours. HbA1C: No results for input(s): "HGBA1C" in the last 72 hours. CBG: No results for input(s): "GLUCAP" in the last 168 hours. Lipid Profile: No results for input(s): "CHOL", "HDL", "LDLCALC", "TRIG", "CHOLHDL", "LDLDIRECT" in the last 72 hours. Thyroid Function Tests: Recent Labs    08/29/23 0528  TSH 1.293   Anemia Panel: No results for input(s): "VITAMINB12", "FOLATE", "FERRITIN", "TIBC", "IRON", "RETICCTPCT" in the last 72 hours. Urine analysis:    Component Value Date/Time   COLORURINE YELLOW 07/01/2023 0248   APPEARANCEUR HAZY (A) 07/01/2023 0248   LABSPEC >1.046 (H) 07/01/2023 0248   PHURINE 5.0 07/01/2023 0248   GLUCOSEU NEGATIVE 07/01/2023 0248   HGBUR SMALL (A) 07/01/2023 0248    BILIRUBINUR NEGATIVE 07/01/2023 0248   KETONESUR NEGATIVE 07/01/2023 0248   PROTEINUR NEGATIVE 07/01/2023 0248   NITRITE NEGATIVE 07/01/2023 0248   LEUKOCYTESUR SMALL (A) 07/01/2023 0248   Sepsis Labs: @LABRCNTIP (procalcitonin:4,lacticidven:4)  ) Recent Results (from the past 240 hours)  Resp panel by RT-PCR (RSV, Flu A&B, Covid) Anterior Nasal Swab     Status: None   Collection Time: 08/28/23 11:56 AM   Specimen: Anterior Nasal Swab  Result Value Ref Range Status   SARS Coronavirus 2 by RT PCR NEGATIVE NEGATIVE Final   Influenza A by PCR NEGATIVE NEGATIVE Final   Influenza B by PCR NEGATIVE NEGATIVE Final    Comment: (NOTE) The Xpert Xpress SARS-CoV-2/FLU/RSV plus assay is intended as an aid in the diagnosis of influenza from Nasopharyngeal swab specimens and should not be used as a sole basis for treatment. Nasal washings and aspirates are unacceptable for Xpert Xpress SARS-CoV-2/FLU/RSV testing.  Fact Sheet for Patients: BloggerCourse.com  Fact Sheet for Healthcare Providers: SeriousBroker.it  This test is not yet approved or cleared by the [place] FDA and has been authorized for detection and/or diagnosis of SARS-CoV-2 by FDA under an Emergency Use Authorization (EUA). This EUA will remain in effect (meaning this test can be used)  for the duration of the COVID-19 declaration under Section 564(b)(1) of the Act, 21 U.S.C. section 360bbb-3(b)(1), unless the authorization is terminated or revoked.     Resp Syncytial Virus by PCR NEGATIVE NEGATIVE Final    Comment: (NOTE) Fact Sheet for Patients: BloggerCourse.com  Fact Sheet for Healthcare Providers: SeriousBroker.it  This test is not yet approved or cleared by the [place] FDA and has been authorized for detection and/or diagnosis of SARS-CoV-2 by FDA under an Emergency Use Authorization (EUA). This EUA  will remain in effect (meaning this test can be used) for the duration of the COVID-19 declaration under Section 564(b)(1) of the Act, 21 U.S.C. section 360bbb-3(b)(1), unless the authorization is terminated or revoked.  Performed at [facility], [address], [place], [place] [address]      Radiology Studies: DG Foot Complete Right Result Date: 08/28/2023 CLINICAL DATA:  Right foot pain and swelling. Random onset of right foot and leg numbness. EXAM: RIGHT FOOT COMPLETE - 3+ VIEW COMPARISON:  None Available. FINDINGS: Mildly decreased bone mineralization. Mild second DIP joint space narrowing and subchondral sclerosis with chronic lateral angulation of the distal phalanx of the second toe. Mild medial great toe metatarsal head degenerative spurring. Mild first through fifth tarsometatarsal subchondral sclerosis. Mild dorsal tarsometatarsal degenerative osteophytosis on lateral view. Moderate posterior and mild to moderate plantar calcaneal heel spur. Well corticated chronic ossicle at the posterior dorsal aspect of the navicular, likely the sequela of remote trauma/degenerative change. Mild dorsal navicular-cuneiform degenerative osteophytosis with moderate navicular-cuneiform joint space narrowing. No acute fracture or dislocation. IMPRESSION: 1. 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 2. Moderate posterior and mild to moderate plantar calcaneal heel spurs. Electronically Signed   By: Neita Garnet M.D.   On: 08/28/2023 16:54   DG Chest 2 View Result Date: 08/28/2023 CLINICAL DATA:  Shortness of breath. EXAM: CHEST - 2 VIEW COMPARISON:  06/30/2023. FINDINGS: Bilateral lung fields are clear. Bilateral costophrenic angles are clear. Stable mildly enlarged cardio-mediastinal silhouette, which is likely accentuated by AP technique. No acute osseous abnormalities. Right reverse shoulder arthroplasty noted. The soft tissues are within normal limits. Note is made of bilateral breast implants.  IMPRESSION: No active cardiopulmonary disease. Electronically Signed   By: Jules Schick M.D.   On: 08/28/2023 16:18   CT Head Wo Contrast Result Date: 08/28/2023 CLINICAL DATA:  Provided history: Mental status change, unknown cause. Additional history provided: Bilateral facial numbness, right leg/foot numbness. Recent back injury. Generalized weakness on neuro exam. EXAM: CT HEAD WITHOUT CONTRAST TECHNIQUE: Contiguous axial images were obtained from the base of the skull through the vertex without intravenous contrast. RADIATION DOSE REDUCTION: This exam was performed according to the departmental dose-optimization program which includes automated exposure control, adjustment of the mA and/or kV according to patient size and/or use of iterative reconstruction technique. COMPARISON:  Head CT 05/01/2022.  Brain MRI 11/25/2014. FINDINGS: Streak/beam hardening artifact arising from dental restoration obscures portions of the supratentorial brain. Within this limitation, findings are as follows. Brain: Mild-to-moderate patchy and ill-defined hypoattenuation within the cerebral white matter, nonspecific but compatible with chronic small vessel disease. Cavum septum pellucidum and cavum vergae (anatomic variant). Partially empty sella turcica. There is no acute intracranial hemorrhage. No demarcated cortical infarct. No extra-axial fluid collection. No evidence of an intracranial mass. No midline shift. Vascular: No hyperdense vessel.  Atherosclerotic calcifications. Skull: No calvarial fracture or aggressive osseous lesion. Sinuses/Orbits: No mass or acute finding within the imaged orbits. Mild mucosal thickening within the left maxillary sinus.  Mild mucosal thickening within the left maxillary and bilateral sphenoid sinuses. Other: Nasal septal defect. IMPRESSION: 1. Streak/beam hardening artifact arising from dental restoration obscures portions of the supratentorial brain. 2. Within this limitation, there is no  evidence of an acute intracranial abnormality. 3. Mild-to-moderate chronic small vessel ischemic changes within the cerebral white matter. 4. Mild paranasal sinus mucosal thickening. 5. Nasal septal defect. Electronically Signed   By: Jackey Loge D.O.   On: 08/28/2023 14:32     Scheduled Meds:  buPROPion  150 mg Oral QPM   cholecalciferol  2,000 Units Oral Daily   enoxaparin (LOVENOX) injection  70 mg Subcutaneous Q24H   feeding supplement  237 mL Oral BID BM   FLUoxetine  40 mg Oral q AM   metoprolol tartrate  12.5 mg Oral q1800   mometasone-formoterol  2 puff Inhalation BID   montelukast  5 mg Oral QHS   multivitamin with minerals  1 tablet Oral Daily   potassium chloride  40 mEq Oral Q3H   rosuvastatin  20 mg Oral Daily   Continuous Infusions:  magnesium sulfate bolus IVPB 4 g (08/29/23 0956)     LOS: 1 day    Time spent:    Zannie Cove, MD [facility]   08/29/2023, 11:52 AM

## 2023-08-30 DIAGNOSIS — E440 Moderate protein-calorie malnutrition: Secondary | ICD-10-CM | POA: Insufficient documentation

## 2023-08-30 LAB — COMPREHENSIVE METABOLIC PANEL
ALT: 15 U/L (ref 0–44)
AST: 21 U/L (ref 15–41)
Albumin: 2.6 g/dL — ABNORMAL LOW (ref 3.5–5.0)
Alkaline Phosphatase: 49 U/L (ref 38–126)
Anion gap: 10 (ref 5–15)
BUN: 15 mg/dL (ref 8–23)
CO2: 26 mmol/L (ref 22–32)
Calcium: 7 mg/dL — ABNORMAL LOW (ref 8.9–10.3)
Chloride: 102 mmol/L (ref 98–111)
Creatinine, Ser: 0.99 mg/dL (ref 0.44–1.00)
GFR, Estimated: 59 mL/min — ABNORMAL LOW (ref >60)
Glucose, Bld: 115 mg/dL — ABNORMAL HIGH (ref 70–99)
Potassium: 4.1 mmol/L (ref 3.5–5.1)
Sodium: 138 mmol/L (ref 135–145)
Total Bilirubin: 1.3 mg/dL — ABNORMAL HIGH (ref 0.0–1.2)
Total Protein: 5.8 g/dL — ABNORMAL LOW (ref 6.5–8.1)

## 2023-08-30 LAB — MAGNESIUM
Magnesium: 1.6 mg/dL — ABNORMAL LOW (ref 1.7–2.4)
Magnesium: 2.4 mg/dL (ref 1.7–2.4)

## 2023-08-30 LAB — PHOSPHORUS
Phosphorus: 2.3 mg/dL — ABNORMAL LOW (ref 2.5–4.6)
Phosphorus: 1.5 mg/dL — ABNORMAL LOW (ref 2.5–4.6)

## 2023-08-30 LAB — POTASSIUM: Potassium: 4.1 mmol/L (ref 3.5–5.1)

## 2023-08-30 LAB — PTH, INTACT AND CALCIUM
Calcium, Total (PTH): 6.2 mg/dL — CL (ref 8.7–10.3)
PTH: 32 pg/mL (ref 15–65)

## 2023-08-30 MED ORDER — MAGNESIUM SULFATE 4 GM/100ML IV SOLN
4.0000 g | Freq: Once | INTRAVENOUS | Status: AC
Start: 2023-08-30 — End: 2023-08-30
  Administered 2023-08-30: 4 g via INTRAVENOUS
  Filled 2023-08-30: qty 100

## 2023-08-30 MED ORDER — POTASSIUM CHLORIDE CRYS ER 20 MEQ PO TBCR
40.0000 meq | EXTENDED_RELEASE_TABLET | Freq: Once | ORAL | Status: AC
  Administered 2023-08-30: 40 meq via ORAL
  Filled 2023-08-30: qty 2

## 2023-08-30 MED ORDER — ACETAMINOPHEN 325 MG PO TABS
650.0000 mg | ORAL_TABLET | Freq: Four times a day (QID) | ORAL | Status: DC | PRN
Start: 2023-08-30 — End: 2023-09-01
  Administered 2023-08-30 – 2023-09-01 (×2): 650 mg via ORAL
  Filled 2023-08-30 (×2): qty 2

## 2023-08-30 NOTE — Plan of Care (Signed)
  Problem: Education: Goal: Knowledge of General Education information will improve Description: Including pain rating scale, medication(s)/side effects and non-pharmacologic comfort measures Outcome: Progressing   Problem: Clinical Measurements: Goal: Diagnostic test results will improve Outcome: Progressing   Problem: Activity: Goal: Risk for activity intolerance will decrease Outcome: Progressing   Problem: Nutrition: Goal: Adequate nutrition will be maintained Outcome: Progressing   Problem: Elimination: Goal: Will not experience complications related to bowel motility Outcome: Progressing   Problem: Pain Managment: Goal: General experience of comfort will improve and/or be controlled Outcome: Progressing

## 2023-08-30 NOTE — Progress Notes (Signed)
PROGRESS NOTE    ANONA GIOVANNINI  HQI:[id number] DOB: [birth year] DOA: 08/28/2023 PCP: Stevphen Rochester, MD  [name] is a morbidly obese chronically ill female with history of remote endometrial CA, OSA/OHS, prior gastric bypass, history of complicated diverticulitis with hemicolectomy, fatty liver/cirrhosis, recently hospitalized with ileus versus small bowel obstruction suspected to be from adhesions, versus motility following recent back injury, this resolved with NG decompression bowel rest, she also had an endoscopy then which noted some gastritis and esophagitis.  Patient reports having a back injury in September preceding this which resulted in disc herniation, she saw neurosurgery in January and had spinal injection, subsequently reports starting on a blood pressure medicine, and since then has been having intermittent nausea and vomiting for few weeks.  Denied any abdominal pain, no diarrhea, has chronic semisolid stools.  Subsequently noticed tingling in her arms and legs, occasionally in her face, this progressively got worse and presented to the ED 2/12 ED Course: Vital signs stable labs noted severe hypokalemia, potassium 2.7, calcium 6.2, magnesium 0.6, troponin 25, WBC 13.4, respiratory panel by RT-PCR was negative, CT head unrevealing, chest x-ray unremarkable   Subjective: -Feels better overall, improving  Assessment and Plan:  Hypocalcemia Hypokalemia Hypomagnesemia -Likely multifactorial, malnutrition, poor oral intake compounded by hydrochlorothiazide and PPI use and possibly malabsorption as well (prior gastric bypass) -Symptoms likely attributable to severe hypokalemia, hypomagnesemia and some hypocalcemia  -Replaced potassium p.o. and IV, repeat oral potassium -Repeat 4 g of IV mag today -Monitor on telemetry -Dietitian consult, DC hydrochlorothiazide and PPI at Discharge -Increase activity, PT OT  Hypocalcemia Corrected calcium for albumin is 8 today Follow-up  PTH, check vitamin D - continue calcium carbonate 3 times daily   Subacute nausea and vomiting -Known GERD on endoscopy 12/24 -Also had ileus versus SBO which resolved with conservative management -Monitor clinically, PPI, supportive care -Abdominal exam is benign -improving   Paroxysmal A-fib -Transient in the setting of recent hospitalization -Not on anticoagulation, in sinus rhythm now, continue metoprolol, monitor on telemetry   NASH, cirrhosis -Needs diet, lifestyle modification   Type 2 diabetes mellitus -Recent A1c was 6.6   Hypertension -Blood pressure soft but stable, continue metoprolol, hold losartan and amlodipine   OSA -Resume CPAP    Morbid obesity with BMI of 50.0-59.9, adult (HCC) -Needs diet and lifestyle modification   Endometrial CA -Follow-up with GYN     DVT prophylaxis: Lovenox Code Status: DN R Family Communication: Patient and family at bedside yesterday Disposition Plan: Home likely 1-2days   Consultants:    Procedures:   Antimicrobials:    Objective: Vitals:   08/29/23 1953 08/29/23 2159 08/30/23 0500 08/30/23 0831  BP: 109/64  136/71 124/72  Pulse: 69 75 80 81  Resp: (!) 22 18 20 18   Temp: 98.3 F (36.8 C)  98.2 F (36.8 C) 98.5 F (36.9 C)  TempSrc: Oral  Oral Oral  SpO2: 96%  98% 97%  Weight:      Height:        Intake/Output Summary (Last 24 hours) at 08/30/2023 1334 Last data filed at 08/30/2023 0930 Gross per 24 hour  Intake 700 ml  Output --  Net 700 ml   Filed Weights   08/28/23 1124 08/29/23 1143  Weight: (!) 147.9 kg (!) 140.8 kg    Examination:  General exam: Obese chronically ill female sitting up in bed, AAOx3 HEENT: Neck obese unable to assess JVD CVS: S1-S2, regular rhythm Lungs: Decreased breath sounds at the bases  Abdomen: Soft, nontender, bowel sounds present Extremities: no edema Skin: No rashes Psychiatry:  Mood & affect appropriate.     Data Reviewed:   CBC: Recent Labs  Lab  08/28/23 1213 08/29/23 0528  WBC 13.4* 9.7  NEUTROABS 11.3*  --   HGB 13.2 12.0  HCT 38.5 34.7*  MCV 90.6 89.9  PLT 295 272   Basic Metabolic Panel: Recent Labs  Lab 08/28/23 1213 08/28/23 1951 08/29/23 0528 08/30/23 0636  NA 137  --  140 138  K 2.7*  --  2.8* 4.1  CL 98  --  98 102  CO2 21*  --  24 26  GLUCOSE 119*  --  109* 115*  BUN 22  --  18 15  CREATININE 1.46*  --  1.19* 0.99  CALCIUM 6.2* 6.2* 6.7* 7.0*  MG 0.6*  --  1.4* 1.6*  PHOS  --   --   --  2.3*   GFR: Estimated Creatinine Clearance: 70.1 mL/min (by C-G formula based on SCr of 0.99 mg/dL). Liver Function Tests: Recent Labs  Lab 08/28/23 1213 08/29/23 0528 08/30/23 0636  AST 16 14* 21  ALT 10 10 15   ALKPHOS 53 47 49  BILITOT 1.9* 1.8* 1.3*  PROT 6.5 5.7* 5.8*  ALBUMIN 2.9* 2.6* 2.6*   No results for input(s): "LIPASE", "AMYLASE" in the last 168 hours. No results for input(s): "AMMONIA" in the last 168 hours. Coagulation Profile: No results for input(s): "INR", "PROTIME" in the last 168 hours. Cardiac Enzymes: No results for input(s): "CKTOTAL", "CKMB", "CKMBINDEX", "TROPONINI" in the last 168 hours. BNP (last 3 results) No results for input(s): "PROBNP" in the last 8760 hours. HbA1C: No results for input(s): "HGBA1C" in the last 72 hours. CBG: No results for input(s): "GLUCAP" in the last 168 hours. Lipid Profile: No results for input(s): "CHOL", "HDL", "LDLCALC", "TRIG", "CHOLHDL", "LDLDIRECT" in the last 72 hours. Thyroid Function Tests: Recent Labs    08/29/23 0528  TSH 1.293   Anemia Panel: No results for input(s): "VITAMINB12", "FOLATE", "FERRITIN", "TIBC", "IRON", "RETICCTPCT" in the last 72 hours. Urine analysis:    Component Value Date/Time   COLORURINE YELLOW 07/01/2023 0248   APPEARANCEUR HAZY (A) 07/01/2023 0248   LABSPEC >1.046 (H) 07/01/2023 0248   PHURINE 5.0 07/01/2023 0248   GLUCOSEU NEGATIVE 07/01/2023 0248   HGBUR SMALL (A) 07/01/2023 0248   BILIRUBINUR NEGATIVE  07/01/2023 0248   KETONESUR NEGATIVE 07/01/2023 0248   PROTEINUR NEGATIVE 07/01/2023 0248   NITRITE NEGATIVE 07/01/2023 0248   LEUKOCYTESUR SMALL (A) 07/01/2023 0248   Sepsis Labs: @LABRCNTIP (procalcitonin:4,lacticidven:4)  ) Recent Results (from the past 240 hours)  Resp panel by RT-PCR (RSV, Flu A&B, Covid) Anterior Nasal Swab     Status: None   Collection Time: 08/28/23 11:56 AM   Specimen: Anterior Nasal Swab  Result Value Ref Range Status   SARS Coronavirus 2 by RT PCR NEGATIVE NEGATIVE Final   Influenza A by PCR NEGATIVE NEGATIVE Final   Influenza B by PCR NEGATIVE NEGATIVE Final    Comment: (NOTE) The Xpert Xpress SARS-CoV-2/FLU/RSV plus assay is intended as an aid in the diagnosis of influenza from Nasopharyngeal swab specimens and should not be used as a sole basis for treatment. Nasal washings and aspirates are unacceptable for Xpert Xpress SARS-CoV-2/FLU/RSV testing.  Fact Sheet for Patients: BloggerCourse.com  Fact Sheet for Healthcare Providers: SeriousBroker.it  This test is not yet approved or cleared by the [place] FDA and has been authorized for detection and/or diagnosis of SARS-CoV-2  by FDA under an Emergency Use Authorization (EUA). This EUA will remain in effect (meaning this test can be used) for the duration of the COVID-19 declaration under Section 564(b)(1) of the Act, 21 U.S.C. section 360bbb-3(b)(1), unless the authorization is terminated or revoked.     Resp Syncytial Virus by PCR NEGATIVE NEGATIVE Final    Comment: (NOTE) Fact Sheet for Patients: BloggerCourse.com  Fact Sheet for Healthcare Providers: SeriousBroker.it  This test is not yet approved or cleared by the [place] FDA and has been authorized for detection and/or diagnosis of SARS-CoV-2 by FDA under an Emergency Use Authorization (EUA). This EUA will remain in effect  (meaning this test can be used) for the duration of the COVID-19 declaration under Section 564(b)(1) of the Act, 21 U.S.C. section 360bbb-3(b)(1), unless the authorization is terminated or revoked.  Performed at [facility], [address] [place], [place] [address]      Radiology Studies: DG Foot Complete Right Result Date: 08/28/2023 CLINICAL DATA:  Right foot pain and swelling. Random onset of right foot and leg numbness. EXAM: RIGHT FOOT COMPLETE - 3+ VIEW COMPARISON:  None Available. FINDINGS: Mildly decreased bone mineralization. Mild second DIP joint space narrowing and subchondral sclerosis with chronic lateral angulation of the distal phalanx of the second toe. Mild medial great toe metatarsal head degenerative spurring. Mild first through fifth tarsometatarsal subchondral sclerosis. Mild dorsal tarsometatarsal degenerative osteophytosis on lateral view. Moderate posterior and mild to moderate plantar calcaneal heel spur. Well corticated chronic ossicle at the posterior dorsal aspect of the navicular, likely the sequela of remote trauma/degenerative change. Mild dorsal navicular-cuneiform degenerative osteophytosis with moderate navicular-cuneiform joint space narrowing. No acute fracture or dislocation. IMPRESSION: 1. 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 2. Moderate posterior and mild to moderate plantar calcaneal heel spurs. Electronically Signed   By: Neita Garnet M.D.   On: 08/28/2023 16:54   DG Chest 2 View Result Date: 08/28/2023 CLINICAL DATA:  Shortness of breath. EXAM: CHEST - 2 VIEW COMPARISON:  06/30/2023. FINDINGS: Bilateral lung fields are clear. Bilateral costophrenic angles are clear. Stable mildly enlarged cardio-mediastinal silhouette, which is likely accentuated by AP technique. No acute osseous abnormalities. Right reverse shoulder arthroplasty noted. The soft tissues are within normal limits. Note is made of bilateral breast implants. IMPRESSION: No active  cardiopulmonary disease. Electronically Signed   By: Jules Schick M.D.   On: 08/28/2023 16:18   CT Head Wo Contrast Result Date: 08/28/2023 CLINICAL DATA:  Provided history: Mental status change, unknown cause. Additional history provided: Bilateral facial numbness, right leg/foot numbness. Recent back injury. Generalized weakness on neuro exam. EXAM: CT HEAD WITHOUT CONTRAST TECHNIQUE: Contiguous axial images were obtained from the base of the skull through the vertex without intravenous contrast. RADIATION DOSE REDUCTION: This exam was performed according to the departmental dose-optimization program which includes automated exposure control, adjustment of the mA and/or kV according to patient size and/or use of iterative reconstruction technique. COMPARISON:  Head CT 05/01/2022.  Brain MRI 11/25/2014. FINDINGS: Streak/beam hardening artifact arising from dental restoration obscures portions of the supratentorial brain. Within this limitation, findings are as follows. Brain: Mild-to-moderate patchy and ill-defined hypoattenuation within the cerebral white matter, nonspecific but compatible with chronic small vessel disease. Cavum septum pellucidum and cavum vergae (anatomic variant). Partially empty sella turcica. There is no acute intracranial hemorrhage. No demarcated cortical infarct. No extra-axial fluid collection. No evidence of an intracranial mass. No midline shift. Vascular: No hyperdense vessel.  Atherosclerotic calcifications. Skull: No calvarial fracture or aggressive  osseous lesion. Sinuses/Orbits: No mass or acute finding within the imaged orbits. Mild mucosal thickening within the left maxillary sinus. Mild mucosal thickening within the left maxillary and bilateral sphenoid sinuses. Other: Nasal septal defect. IMPRESSION: 1. Streak/beam hardening artifact arising from dental restoration obscures portions of the supratentorial brain. 2. Within this limitation, there is no evidence of an acute  intracranial abnormality. 3. Mild-to-moderate chronic small vessel ischemic changes within the cerebral white matter. 4. Mild paranasal sinus mucosal thickening. 5. Nasal septal defect. Electronically Signed   By: Jackey Loge D.O.   On: 08/28/2023 14:32     Scheduled Meds:  buPROPion  150 mg Oral QPM   calcium carbonate  400 mg of elemental calcium Oral TID   cholecalciferol  2,000 Units Oral Daily   enoxaparin (LOVENOX) injection  70 mg Subcutaneous Q24H   feeding supplement  237 mL Oral BID BM   FLUoxetine  40 mg Oral q AM   metoprolol tartrate  12.5 mg Oral q1800   mometasone-formoterol  2 puff Inhalation BID   montelukast  5 mg Oral QHS   multivitamin with minerals  1 tablet Oral Daily   pantoprazole (PROTONIX) IV  40 mg Intravenous Q24H   rosuvastatin  20 mg Oral Daily   thiamine  100 mg Oral Daily   Continuous Infusions:  magnesium sulfate bolus IVPB       LOS: 2 days    Time spent:    Zannie Cove, MD [facility]   08/30/2023, 1:34 PM

## 2023-08-30 NOTE — Progress Notes (Signed)
Physical Therapy Treatment Patient Details Name: Danielle Stephenson MRN: [id number] DOB: Jul 05, 1946 Today's Date: 08/30/2023   History of Present Illness Pt is a 78 y/o female presents to ED 2/12 with tingling in arms, legs and face. Found to have hypomagnesemia, and hypokalemia and admitted PMHx, remote endometrial CA, OSA/OHS, prior gastric bypass, history of complicated diverticulitis with hemicolectomy, fatty liver/cirrhosis, gastritis and esophagitis, spinal injection    PT Comments  Received pt semi-reclined in bed with questions/concerns regarding stair navigation. Pt reports having 10+ steps in her townhouse with 1 handrail and that her grandson was assisting prior to admission. Pt performed bed mobility mod I using bed features and transferred into recliner with RW and mod I. Transported to stairwell, stood without AD and supervision, and navigated 6 6in steps with R handrail and CGA using a lateral stepping technique. Provided cues for safety, hand placement, and recommended grandson be behind her as she ascends steps (demonstrated proper positioning and hand placement). Pt required standing rest break at 6th step and seated rest break afterwards. Pt reports having landing after step 10 - recommended placing chair on landing for energy conservation. Returned to room and pt left in recliner with all needs met. Discharge recommendations remain appropriate.     If plan is discharge home, recommend the following: Assist for transportation;Help with stairs or ramp for entrance   Can travel by private vehicle        Equipment Recommendations  Rolling walker (2 wheels);BSC/3in1 (bariatric due for weight and body habitus)    Recommendations for Other Services       Precautions / Restrictions Precautions Precautions: Fall Precaution/Restrictions Comments: 9/24 fall Restrictions Weight Bearing Restrictions Per Provider Order: No     Mobility  Bed Mobility Overal bed mobility: Modified  Independent             General bed mobility comments: HOB slightly elevated and minor use of bed rail Patient Response: Cooperative  Transfers Overall transfer level: Modified independent Equipment used: Rolling walker (2 wheels)               General transfer comment: Stood from EOB and transferred into recliner with RW mod I.    Ambulation/Gait               General Gait Details: pt requesting to focus on stair navigation this session   Stairs Stairs: Yes Stairs assistance: Contact guard assist Stair Management: One rail Right, Step to pattern, Sideways Number of Stairs: 6 (6in) General stair comments: Stood from recliner at stairwell with supervision and navigated 6 6in steps with R handrail using a lateral stepping technique. Provided cues for BUE support on handrail for improved stability and encouraged pt to have grandson behind her and ascending with her at home for safety - pt verbalized understanding and in agreement. Required standing rest break on 6th step prior to descending.   Wheelchair Mobility     Tilt Bed Tilt Bed Patient Response: Cooperative  Modified Rankin (Stroke Patients Only)       Balance Overall balance assessment: Needs assistance Sitting-balance support: Feet supported, No upper extremity supported Sitting balance-Leahy Scale: Good     Standing balance support: Bilateral upper extremity supported, During functional activity (RW) Standing balance-Leahy Scale: Good                              Communication Communication Communication: No apparent difficulties  Cognition Arousal: Alert Behavior During  Therapy: WFL for tasks assessed/performed   PT - Cognitive impairments: No apparent impairments                       PT - Cognition Comments: pleasant and cooperative Following commands: Intact      Cueing Cueing Techniques: Verbal cues  Exercises      General Comments General comments (skin  integrity, edema, etc.): pt requires CGA for higher level balance activites (ex: stair navigation)      Pertinent Vitals/Pain Pain Assessment Pain Assessment: Faces Faces Pain Scale: Hurts a little bit Pain Location: RLE Pain Descriptors / Indicators: Discomfort, Grimacing, Sore Pain Intervention(s): Limited activity within patient's tolerance, Monitored during session, Repositioned    Home Living                          Prior Function            PT Goals (current goals can now be found in the care plan section) Acute Rehab PT Goals PT Goal Formulation: With patient Time For Goal Achievement: 09/12/23 Potential to Achieve Goals: Good Progress towards PT goals: Progressing toward goals    Frequency    Min 1X/week      PT Plan      Co-evaluation              AM-PAC PT "6 Clicks" Mobility   Outcome Measure  Help needed turning from your back to your side while in a flat bed without using bedrails?: None Help needed moving from lying on your back to sitting on the side of a flat bed without using bedrails?: None Help needed moving to and from a bed to a chair (including a wheelchair)?: None Help needed standing up from a chair using your arms (e.g., wheelchair or bedside chair)?: None Help needed to walk in hospital room?: None Help needed climbing 3-5 steps with a railing? : A Little 6 Click Score: 23    End of Session   Activity Tolerance: Patient tolerated treatment well Patient left: in chair;with call bell/phone within reach Nurse Communication: Mobility status;Other (comment) (use RW for safety) PT Visit Diagnosis: Unsteadiness on feet (R26.81);Other abnormalities of gait and mobility (R26.89);Pain;Muscle weakness (generalized) (M62.81) Pain - Right/Left: Right Pain - part of body: Leg     Time: 1027-2536 PT Time Calculation (min) (ACUTE ONLY): 20 min  Charges:    $Therapeutic Activity: 8-22 mins PT General Charges $$ ACUTE PT  VISIT: 1 Visit                     Blima Rich PT, DPT Marlana Salvage Zaunegger 08/30/2023, 11:41 AM

## 2023-08-30 NOTE — Progress Notes (Signed)
Mobility Specialist Progress Note:   08/30/23 1122  Mobility  Activity Ambulated with assistance in room  Level of Assistance Standby assist, set-up cues, supervision of patient - no hands on  Assistive Device Front wheel walker  Distance Ambulated (ft) 40 ft  Activity Response Tolerated well  Mobility Referral Yes  Mobility visit 1 Mobility  Mobility Specialist Start Time (ACUTE ONLY) 1130  Mobility Specialist Stop Time (ACUTE ONLY) 1145  Mobility Specialist Time Calculation (min) (ACUTE ONLY) 15 min   Pt received in bed, agreeable to mobility. C/o slight R foot pain, otherwise asx throughout. No physical assistance needed during session. Pt stated she feels more stable with RW. Pt left EOB with RN present in room.   Leory Plowman  Mobility Specialist Please contact via [employer] office at [phone]

## 2023-08-30 NOTE — Progress Notes (Signed)
   08/30/23 2053  BiPAP/CPAP/SIPAP  Reason BIPAP/CPAP not in use Non-compliant   Pt refused CPAP for nighttime use.

## 2023-08-31 LAB — COMPREHENSIVE METABOLIC PANEL
ALT: 15 U/L (ref 0–44)
AST: 20 U/L (ref 15–41)
Albumin: 2.6 g/dL — ABNORMAL LOW (ref 3.5–5.0)
Alkaline Phosphatase: 43 U/L (ref 38–126)
Anion gap: 11 (ref 5–15)
BUN: 11 mg/dL (ref 8–23)
CO2: 25 mmol/L (ref 22–32)
Calcium: 7.8 mg/dL — ABNORMAL LOW (ref 8.9–10.3)
Chloride: 103 mmol/L (ref 98–111)
Creatinine, Ser: 0.8 mg/dL (ref 0.44–1.00)
GFR, Estimated: >60 mL/min (ref >60)
Glucose, Bld: 122 mg/dL — ABNORMAL HIGH (ref 70–99)
Potassium: 3.7 mmol/L (ref 3.5–5.1)
Sodium: 139 mmol/L (ref 135–145)
Total Bilirubin: 1 mg/dL (ref 0.0–1.2)
Total Protein: 5.8 g/dL — ABNORMAL LOW (ref 6.5–8.1)

## 2023-08-31 LAB — MAGNESIUM
Magnesium: 1.6 mg/dL — ABNORMAL LOW (ref 1.7–2.4)
Magnesium: 2.3 mg/dL (ref 1.7–2.4)

## 2023-08-31 LAB — POTASSIUM: Potassium: 4 mmol/L (ref 3.5–5.1)

## 2023-08-31 LAB — PHOSPHORUS
Phosphorus: 2.3 mg/dL — ABNORMAL LOW (ref 2.5–4.6)
Phosphorus: 1.3 mg/dL — ABNORMAL LOW (ref 2.5–4.6)

## 2023-08-31 MED ORDER — MAGNESIUM SULFATE 4 GM/100ML IV SOLN
4.0000 g | Freq: Once | INTRAVENOUS | Status: AC
  Administered 2023-08-31: 4 g via INTRAVENOUS
  Filled 2023-08-31: qty 100

## 2023-08-31 NOTE — Plan of Care (Signed)

## 2023-08-31 NOTE — Progress Notes (Signed)
Mobility Specialist Progress Note:    08/31/23 1400  Mobility  Activity Ambulated with assistance in hallway  Level of Assistance Standby assist, set-up cues, supervision of patient - no hands on  Assistive Device Front wheel walker  Distance Ambulated (ft) 100 ft  Activity Response Tolerated well  Mobility Referral Yes  Mobility visit 1 Mobility  Mobility Specialist Start Time (ACUTE ONLY) 1411  Mobility Specialist Stop Time (ACUTE ONLY) 1426  Mobility Specialist Time Calculation (min) (ACUTE ONLY) 15 min   Pt received in chair and agreeable. C/o R foot pain from gout, otherwise asymptomatic. Pt left in chair with call bell and all needs met.  D'Vante Earlene Plater Mobility Specialist Please contact via Special educational needs teacher or Rehab office at [phone]

## 2023-08-31 NOTE — Plan of Care (Signed)
  Problem: Education: Goal: Knowledge of General Education information will improve Description: Including pain rating scale, medication(s)/side effects and non-pharmacologic comfort measures 08/31/2023 1936 by Birdena Jubilee, RN Outcome: Progressing 08/31/2023 1935 by Birdena Jubilee, RN Outcome: Progressing   Problem: Health Behavior/Discharge Planning: Goal: Ability to manage health-related needs will improve 08/31/2023 1936 by Birdena Jubilee, RN Outcome: Progressing 08/31/2023 1935 by Trula Slade I, RN Outcome: Progressing   Problem: Clinical Measurements: Goal: Ability to maintain clinical measurements within normal limits will improve 08/31/2023 1936 by Birdena Jubilee, RN Outcome: Progressing 08/31/2023 1935 by Trula Slade I, RN Outcome: Progressing Goal: Will remain free from infection 08/31/2023 1936 by Birdena Jubilee, RN Outcome: Progressing 08/31/2023 1935 by Trula Slade I, RN Outcome: Progressing Goal: Diagnostic test results will improve 08/31/2023 1936 by Birdena Jubilee, RN Outcome: Progressing 08/31/2023 1935 by Trula Slade I, RN Outcome: Progressing Goal: Respiratory complications will improve 08/31/2023 1936 by Birdena Jubilee, RN Outcome: Progressing 08/31/2023 1935 by Trula Slade I, RN Outcome: Progressing Goal: Cardiovascular complication will be avoided 08/31/2023 1936 by Birdena Jubilee, RN Outcome: Progressing 08/31/2023 1935 by Birdena Jubilee, RN Outcome: Progressing   Problem: Activity: Goal: Risk for activity intolerance will decrease 08/31/2023 1936 by Birdena Jubilee, RN Outcome: Progressing 08/31/2023 1935 by Trula Slade I, RN Outcome: Progressing   Problem: Nutrition: Goal: Adequate nutrition will be maintained 08/31/2023 1936 by Birdena Jubilee, RN Outcome: Progressing 08/31/2023 1935 by Trula Slade I, RN Outcome: Progressing   Problem: Coping: Goal: Level of anxiety will decrease 08/31/2023  1936 by Birdena Jubilee, RN Outcome: Progressing 08/31/2023 1935 by Trula Slade I, RN Outcome: Progressing   Problem: Elimination: Goal: Will not experience complications related to bowel motility 08/31/2023 1936 by Birdena Jubilee, RN Outcome: Progressing 08/31/2023 1935 by Trula Slade I, RN Outcome: Progressing Goal: Will not experience complications related to urinary retention 08/31/2023 1936 by Birdena Jubilee, RN Outcome: Progressing 08/31/2023 1935 by Trula Slade I, RN Outcome: Progressing   Problem: Elimination: Goal: Will not experience complications related to urinary retention 08/31/2023 1936 by Birdena Jubilee, RN Outcome: Progressing 08/31/2023 1935 by Trula Slade I, RN Outcome: Progressing   Problem: Elimination: Goal: Will not experience complications related to urinary retention 08/31/2023 1936 by Birdena Jubilee, RN Outcome: Progressing 08/31/2023 1935 by Trula Slade I, RN Outcome: Progressing   Problem: Pain Managment: Goal: General experience of comfort will improve and/or be controlled 08/31/2023 1936 by Birdena Jubilee, RN Outcome: Progressing 08/31/2023 1935 by Trula Slade I, RN Outcome: Progressing   Problem: Safety: Goal: Ability to remain free from injury will improve 08/31/2023 1936 by Birdena Jubilee, RN Outcome: Progressing 08/31/2023 1935 by Trula Slade I, RN Outcome: Progressing   Problem: Skin Integrity: Goal: Risk for impaired skin integrity will decrease 08/31/2023 1936 by Birdena Jubilee, RN Outcome: Progressing 08/31/2023 1935 by Birdena Jubilee, RN Outcome: Progressing

## 2023-08-31 NOTE — Progress Notes (Signed)
PROGRESS NOTE    Danielle Stephenson  WNU:[id number] DOB: 05-28-1946 DOA: 08/28/2023 PCP: Stevphen Rochester, MD  [name] is a morbidly obese chronically ill female with history of remote endometrial CA, OSA/OHS, prior gastric bypass, history of complicated diverticulitis with hemicolectomy, fatty liver/cirrhosis, recently hospitalized with ileus versus small bowel obstruction suspected to be from adhesions, versus motility following recent back injury, this resolved with NG decompression bowel rest, she also had an endoscopy then which noted some gastritis and esophagitis.  Patient reports having a back injury in September preceding this which resulted in disc herniation, she saw neurosurgery in January and had spinal injection, subsequently reports starting on a blood pressure medicine, and since then has been having intermittent nausea and vomiting for few weeks.  Denied any abdominal pain, no diarrhea, has chronic semisolid stools.  Subsequently noticed tingling in her arms and legs, occasionally in her face, this progressively got worse and presented to the ED 2/12 ED Course: Vital signs stable labs noted severe hypokalemia, potassium 2.7, calcium 6.2, magnesium 0.6, troponin 25, WBC 13.4, respiratory panel by RT-PCR was negative, CT head unrevealing, chest x-ray unremarkable   Subjective: -Feels well, no complaints, ambulating, denies nausea or vomiting  Assessment and Plan:  Hypocalcemia Hypokalemia Hypomagnesemia -Likely multifactorial, malnutrition, poor oral intake compounded by hydrochlorothiazide and PPI use and possibly malabsorption as well (prior gastric bypass) -Replaced potassium p.o. and IV, repeat oral potassium -Mag level improving but still low, repeat 4 g of IV mag today -Monitor on telemetry -Dietitian consulted, DC hydrochlorothiazide and PPI at Discharge -Activity, discharge planning  Hypocalcemia Corrected calcium for albumin is close to normal now -Vitamin D is low  normal range - continue calcium carbonate 3 times daily   Subacute nausea and vomiting -Known GERD on endoscopy 12/24 -Also had ileus versus SBO which resolved with conservative management -Monitor clinically, PPI, supportive care -Abdominal exam is benign -improving   Paroxysmal A-fib -Transient in the setting of recent hospitalization -Not on anticoagulation, in sinus rhythm now, continue metoprolol, monitor on telemetry   NASH, cirrhosis -Needs diet, lifestyle modification   Type 2 diabetes mellitus -Recent A1c was 6.6   Hypertension -Blood pressure soft but stable, continue metoprolol, hold losartan and amlodipine   OSA -Resume CPAP    Morbid obesity with BMI of 50.0-59.9, adult (HCC) -Needs diet and lifestyle modification   Endometrial CA -Follow-up with GYN     DVT prophylaxis: Lovenox Code Status: DN R Family Communication: None present Disposition Plan: Home likely tomorrow   Consultants:    Procedures:   Antimicrobials:    Objective: Vitals:   08/30/23 2040 08/30/23 2051 08/31/23 0454 08/31/23 0818  BP: 94/60  127/61 (!) 124/49  Pulse: 77  80 83  Resp: 18  18 18   Temp: 98 F (36.7 C)  98.2 F (36.8 C) 98.2 F (36.8 C)  TempSrc: Oral  Oral Oral  SpO2: 94% 95% 93% 97%  Weight:      Height:        Intake/Output Summary (Last 24 hours) at 08/31/2023 1157 Last data filed at 08/30/2023 0347 Gross per 24 hour  Intake 460 ml  Output --  Net 460 ml   Filed Weights   08/28/23 1124 08/29/23 1143  Weight: (!) 147.9 kg (!) 140.8 kg    Examination:  General exam: Obese pleasant female laying in bed, AAOx3 CVS: S1-S2, regular rhythm Lungs: Decreased breath sounds to bases Abdomen: Soft, nontender, bowel sounds present Extremities: no edema Skin: No rashes  Psychiatry:  Mood & affect appropriate.     Data Reviewed:   CBC: Recent Labs  Lab 08/28/23 1213 08/29/23 0528  WBC 13.4* 9.7  NEUTROABS 11.3*  --   HGB 13.2 12.0  HCT 38.5  34.7*  MCV 90.6 89.9  PLT 295 272   Basic Metabolic Panel: Recent Labs  Lab 08/28/23 1213 08/28/23 1951 08/29/23 0528 08/30/23 0636 08/30/23 1651 08/31/23 0627  NA 137  --  140 138  --  139  K 2.7*  --  2.8* 4.1 4.1 3.7  CL 98  --  98 102  --  103  CO2 21*  --  24 26  --  25  GLUCOSE 119*  --  109* 115*  --  122*  BUN 22  --  18 15  --  11  CREATININE 1.46*  --  1.19* 0.99  --  0.80  CALCIUM 6.2* 6.2* 6.7* 7.0*  --  7.8*  MG 0.6*  --  1.4* 1.6* 2.4 1.6*  PHOS  --   --   --  2.3* 1.5* 2.3*   GFR: Estimated Creatinine Clearance: 86.8 mL/min (by C-G formula based on SCr of 0.8 mg/dL). Liver Function Tests: Recent Labs  Lab 08/28/23 1213 08/29/23 0528 08/30/23 0636 08/31/23 0627  AST 16 14* 21 20  ALT 10 10 15 15   ALKPHOS 53 47 49 43  BILITOT 1.9* 1.8* 1.3* 1.0  PROT 6.5 5.7* 5.8* 5.8*  ALBUMIN 2.9* 2.6* 2.6* 2.6*   No results for input(s): "LIPASE", "AMYLASE" in the last 168 hours. No results for input(s): "AMMONIA" in the last 168 hours. Coagulation Profile: No results for input(s): "INR", "PROTIME" in the last 168 hours. Cardiac Enzymes: No results for input(s): "CKTOTAL", "CKMB", "CKMBINDEX", "TROPONINI" in the last 168 hours. BNP (last 3 results) No results for input(s): "PROBNP" in the last 8760 hours. HbA1C: No results for input(s): "HGBA1C" in the last 72 hours. CBG: No results for input(s): "GLUCAP" in the last 168 hours. Lipid Profile: No results for input(s): "CHOL", "HDL", "LDLCALC", "TRIG", "CHOLHDL", "LDLDIRECT" in the last 72 hours. Thyroid Function Tests: Recent Labs    08/29/23 0528  TSH 1.293   Anemia Panel: No results for input(s): "VITAMINB12", "FOLATE", "FERRITIN", "TIBC", "IRON", "RETICCTPCT" in the last 72 hours. Urine analysis:    Component Value Date/Time   COLORURINE YELLOW 07/01/2023 0248   APPEARANCEUR HAZY (A) 07/01/2023 0248   LABSPEC >1.046 (H) 07/01/2023 0248   PHURINE 5.0 07/01/2023 0248   GLUCOSEU NEGATIVE 07/01/2023  0248   HGBUR SMALL (A) 07/01/2023 0248   BILIRUBINUR NEGATIVE 07/01/2023 0248   KETONESUR NEGATIVE 07/01/2023 0248   PROTEINUR NEGATIVE 07/01/2023 0248   NITRITE NEGATIVE 07/01/2023 0248   LEUKOCYTESUR SMALL (A) 07/01/2023 0248   Sepsis Labs: @LABRCNTIP (procalcitonin:4,lacticidven:4)  ) Recent Results (from the past 240 hours)  Resp panel by RT-PCR (RSV, Flu A&B, Covid) Anterior Nasal Swab     Status: None   Collection Time: 08/28/23 11:56 AM   Specimen: Anterior Nasal Swab  Result Value Ref Range Status   SARS Coronavirus 2 by RT PCR NEGATIVE NEGATIVE Final   Influenza A by PCR NEGATIVE NEGATIVE Final   Influenza B by PCR NEGATIVE NEGATIVE Final    Comment: (NOTE) The Xpert Xpress SARS-CoV-2/FLU/RSV plus assay is intended as an aid in the diagnosis of influenza from Nasopharyngeal swab specimens and should not be used as a sole basis for treatment. Nasal washings and aspirates are unacceptable for Xpert Xpress SARS-CoV-2/FLU/RSV testing.  Fact Sheet  for Patients: BloggerCourse.com  Fact Sheet for Healthcare Providers: SeriousBroker.it  This test is not yet approved or cleared by the [place] FDA and has been authorized for detection and/or diagnosis of SARS-CoV-2 by FDA under an Emergency Use Authorization (EUA). This EUA will remain in effect (meaning this test can be used) for the duration of the COVID-19 declaration under Section 564(b)(1) of the Act, 21 U.S.C. section 360bbb-3(b)(1), unless the authorization is terminated or revoked.     Resp Syncytial Virus by PCR NEGATIVE NEGATIVE Final    Comment: (NOTE) Fact Sheet for Patients: BloggerCourse.com  Fact Sheet for Healthcare Providers: SeriousBroker.it  This test is not yet approved or cleared by the [place] FDA and has been authorized for detection and/or diagnosis of SARS-CoV-2 by FDA under an  Emergency Use Authorization (EUA). This EUA will remain in effect (meaning this test can be used) for the duration of the COVID-19 declaration under Section 564(b)(1) of the Act, 21 U.S.C. section 360bbb-3(b)(1), unless the authorization is terminated or revoked.  Performed at [facility], [address] [place], [place] [address]      Radiology Studies: No results found.    Scheduled Meds:  buPROPion  150 mg Oral QPM   calcium carbonate  400 mg of elemental calcium Oral TID   cholecalciferol  2,000 Units Oral Daily   enoxaparin (LOVENOX) injection  70 mg Subcutaneous Q24H   feeding supplement  237 mL Oral BID BM   FLUoxetine  40 mg Oral q AM   metoprolol tartrate  12.5 mg Oral q1800   mometasone-formoterol  2 puff Inhalation BID   montelukast  5 mg Oral QHS   multivitamin with minerals  1 tablet Oral Daily   rosuvastatin  20 mg Oral Daily   thiamine  100 mg Oral Daily   Continuous Infusions:  magnesium sulfate bolus IVPB 4 g (08/31/23 1103)     LOS: 3 days    Time spent:    Zannie Cove, MD [facility]   08/31/2023, 11:57 AM

## 2023-09-01 LAB — COMPREHENSIVE METABOLIC PANEL
ALT: 17 U/L (ref 0–44)
AST: 24 U/L (ref 15–41)
Albumin: 2.8 g/dL — ABNORMAL LOW (ref 3.5–5.0)
Alkaline Phosphatase: 44 U/L (ref 38–126)
Anion gap: 15 (ref 5–15)
BUN: 14 mg/dL (ref 8–23)
CO2: 23 mmol/L (ref 22–32)
Calcium: 8.7 mg/dL — ABNORMAL LOW (ref 8.9–10.3)
Chloride: 101 mmol/L (ref 98–111)
Creatinine, Ser: 0.93 mg/dL (ref 0.44–1.00)
GFR, Estimated: >60 mL/min (ref >60)
Glucose, Bld: 124 mg/dL — ABNORMAL HIGH (ref 70–99)
Potassium: 4.2 mmol/L (ref 3.5–5.1)
Sodium: 139 mmol/L (ref 135–145)
Total Bilirubin: 0.9 mg/dL (ref 0.0–1.2)
Total Protein: 6 g/dL — ABNORMAL LOW (ref 6.5–8.1)

## 2023-09-01 LAB — PHOSPHORUS: Phosphorus: 2 mg/dL — ABNORMAL LOW (ref 2.5–4.6)

## 2023-09-01 LAB — MAGNESIUM: Magnesium: 1.7 mg/dL (ref 1.7–2.4)

## 2023-09-01 MED ORDER — METOPROLOL TARTRATE 25 MG PO TABS: 12.5000 mg | ORAL_TABLET | Freq: Two times a day (BID) | ORAL | 1 refills | Status: AC

## 2023-09-01 MED ORDER — CALCIUM CARBONATE ANTACID 500 MG PO CHEW: 400.0000 mg | CHEWABLE_TABLET | Freq: Two times a day (BID) | ORAL | 0 refills | Status: AC

## 2023-09-01 MED ORDER — LOSARTAN POTASSIUM 25 MG PO TABS: 25.0000 mg | ORAL_TABLET | Freq: Every day | ORAL | 0 refills | Status: AC

## 2023-09-01 MED ORDER — MAGNESIUM OXIDE 250 MG PO TABS: 250.0000 mg | ORAL_TABLET | Freq: Two times a day (BID) | ORAL | 0 refills | Status: AC

## 2023-09-01 NOTE — Discharge Summary (Signed)
Physician Discharge Summary  Danielle Stephenson QMV:[id number] DOB: August 02, 1945 DOA: 08/28/2023  PCP: Stevphen Rochester, MD  Admit date: 08/28/2023 Discharge date: 09/01/2023  Time spent:45 minutes  Recommendations for Outpatient Follow-up:  PCP in 1 week Discontinued HCTZ on account of significant electrolyte abnormalities Discontinued PPI for severe hypomagnesemia Check CMP and mag level in 2 weeks  Discharge Diagnoses:  Principal Problem:   Hypomagnesemia Hypokalemia Hypocalcemia   OSA on CPAP   Morbid obesity with BMI of 50.0-59.9, adult (HCC)   Nausea without vomiting   Type 2 diabetes mellitus without complication, without long-term current use of insulin (HCC)   Endometrial adenocarcinoma (HCC)   Gastritis and gastroduodenitis   Liver cirrhosis secondary to NASH (HCC)   Hypokalemia   Hypocalcemia   Malnutrition of moderate degree   Discharge Condition: Improved  Diet recommendation: Low-sodium, heart healthy  Filed Weights   08/28/23 1124 08/29/23 1143  Weight: (!) 147.9 kg (!) 140.8 kg    History of present illness:  Danielle Stephenson is a morbidly obese chronically ill female with history of remote endometrial CA, OSA/OHS, prior gastric bypass, history of complicated diverticulitis with hemicolectomy, fatty liver/cirrhosis, recently hospitalized with ileus versus small bowel obstruction suspected to be from adhesions, versus motility following recent back injury, this resolved with NG decompression bowel rest, she also had an endoscopy then which noted some gastritis and esophagitis.  Patient reports having a back injury in September preceding this which resulted in disc herniation, she saw neurosurgery in January and had spinal injection, subsequently reports starting on a blood pressure medicine, and since then has been having intermittent nausea and vomiting for few weeks.  Denied any abdominal pain, no diarrhea, has chronic semisolid stools.  Subsequently noticed tingling in  her arms and legs, occasionally in her face, this progressively got worse and presented to the ED 2/12 ED Course: Vital signs stable labs noted severe hypokalemia, potassium 2.7, calcium 6.2, magnesium 0.6, troponin 25, WBC 13.4, respiratory panel by RT-PCR was negative, CT head unrevealing, chest x-ray unremarkable  Hospital Course:   Hypocalcemia Hypokalemia Hypomagnesemia -With significant symptoms and functional decline,  -Etiology likely multifactorial, malnutrition, poor oral intake compounded by hydrochlorothiazide and PPI use and possibly malabsorption as well (prior gastric bypass) -Replaced potassium p.o. and IV, repeat oral potassium -Mag level improved, add oral magnesium for 5 more days -Dietitian consulted, DC hydrochlorothiazide and PPI at Discharge -Discharged home in improved condition, follow-up with PCP in 1 week, repeat labs in 2 weeks   Hypocalcemia Corrected calcium for albumin is close to normal now -Vitamin D is low normal range - continue calcium carbonate 3 times daily for [redacted] week along with vitamin D   Subacute nausea and vomiting -Known GERD on endoscopy 12/24 -Also had ileus versus SBO which resolved with conservative management -Monitor clinically, PPI, supportive care -Abdominal exam is benign -improving   Paroxysmal A-fib -Transient in the setting of recent hospitalization -Not on anticoagulation, in sinus rhythm now, continue metoprolol, monitor on telemetry, she was also on carvedilol which is unnecessary while on metoprolol, hence carvedilol was discontinued   NASH, cirrhosis -Needs diet, lifestyle modification   Type 2 diabetes mellitus -Recent A1c was 6.6   Hypertension -Blood pressure soft but stable, continue metoprolol,, losartan low-dose resumed, discontinue amlodipine and Coreg  OSA -Resume CPAP    Morbid obesity with BMI of 50.0-59.9, adult (HCC) -Needs diet and lifestyle modification   Remote history of endometrial  CA -Follow-up with GYN  Discharge Exam: Vitals:  09/01/23 0503 09/01/23 0756  BP: 134/85 113/70  Pulse: 71 74  Resp: 18 17  Temp: 98 F (36.7 C)   SpO2: 96% 96%   General exam: Obese pleasant female laying in bed, AAOx3 CVS: S1-S2, regular rhythm Lungs: Decreased breath sounds to bases Abdomen: Soft, nontender, bowel sounds present Extremities: no edema Skin: No rashes Psychiatry:  Mood & affect appropriate.     Discharge Instructions   Discharge Instructions     Diet - low sodium heart healthy   Complete by: As directed    Discharge instructions   Complete by: As directed    FU with PCP in 1 week, check CMP, and Mag level in 2 weeks   Increase activity slowly   Complete by: As directed       Allergies as of 09/01/2023       Reactions   Penicillins Anaphylaxis, Swelling   Has patient had a PCN reaction causing immediate rash, facial/tongue/throat swelling, SOB or lightheadedness with hypotension: Yes Has patient had a PCN reaction causing severe rash involving mucus membranes or skin necrosis: No Has patient had a PCN reaction that required hospitalization: No Has patient had a PCN reaction occurring within the last 10 years: No If all of the above answers are "NO", then may proceed with Cephalosporin use.   Ciprofloxacin Other (See Comments)   Severe vertigo/dizziness   Iodine Rash, Other (See Comments)   Rash, blisters - PT IS ALLERGIC TO TOPICAL IODINE, OK W/ IV CONTRAST EDITED BY ALICE CALHOUN ON 02-04-14    Demerol [meperidine] Other (See Comments)   Mood changes w/hallucinations.   Tape Other (See Comments)   Blisters with tegaderm        Medication List     STOP taking these medications    amLODipine 10 MG tablet Commonly known as: NORVASC   carvedilol 12.5 MG tablet Commonly known as: COREG   dexamethasone 4 MG tablet Commonly known as: DECADRON   hydrochlorothiazide 25 MG tablet Commonly known as: HYDRODIURIL   oxyCODONE-acetaminophen  5-325 MG tablet Commonly known as: Percocet   pantoprazole 40 MG tablet Commonly known as: PROTONIX       TAKE these medications    acetaminophen 500 MG tablet Commonly known as: TYLENOL Take 1,000 mg by mouth as needed.   albuterol 108 (90 Base) MCG/ACT inhaler Commonly known as: VENTOLIN HFA Inhale 1-2 puffs into the lungs every 4 (four) hours as needed for wheezing.   buPROPion 150 MG 24 hr tablet Commonly known as: WELLBUTRIN XL Take 1 tablet by mouth every evening.   calcium carbonate 500 MG chewable tablet Commonly known as: TUMS - dosed in mg elemental calcium Chew 2 tablets (400 mg of elemental calcium total) by mouth 2 (two) times daily with a meal for 7 days.   FLUoxetine 40 MG capsule Commonly known as: PROZAC Take 40 mg by mouth in the morning.   Fluticasone-Salmeterol 500-50 MCG/DOSE Aepb Commonly known as: ADVAIR Inhale 1 puff into the lungs daily as needed.   levonorgestrel 20 MCG/24HR IUD Commonly known as: MIRENA 1 Intra Uterine Device (1 each total) by Intrauterine route once for 1 dose. To be placed in OR What changed:  when to take this additional instructions   losartan 25 MG tablet Commonly known as: COZAAR Take 1 tablet (25 mg total) by mouth daily. What changed:  medication strength how much to take   Magnesium Oxide 250 MG Tabs Take 1 tablet (250 mg total) by mouth 2 (two) times  daily for 5 days.   metoprolol tartrate 25 MG tablet Commonly known as: LOPRESSOR Take 0.5 tablets (12.5 mg total) by mouth 2 (two) times daily. What changed: when to take this   montelukast 5 MG chewable tablet Commonly known as: SINGULAIR Chew 5 mg by mouth daily.   multivitamin with minerals tablet Take 1 tablet by mouth daily.   rosuvastatin 20 MG tablet Commonly known as: CRESTOR Take 20 mg by mouth daily.   Vitamin D 50 MCG (2000 UT) tablet Take 2,000 Units by mouth daily.       Allergies  Allergen Reactions   Penicillins Anaphylaxis  and Swelling    Has patient had a PCN reaction causing immediate rash, facial/tongue/throat swelling, SOB or lightheadedness with hypotension: Yes Has patient had a PCN reaction causing severe rash involving mucus membranes or skin necrosis: No Has patient had a PCN reaction that required hospitalization: No Has patient had a PCN reaction occurring within the last 10 years: No If all of the above answers are "NO", then may proceed with Cephalosporin use.    Ciprofloxacin Other (See Comments)    Severe vertigo/dizziness   Iodine Rash and Other (See Comments)    Rash, blisters - PT IS ALLERGIC TO TOPICAL IODINE, OK W/ IV CONTRAST EDITED BY ALICE CALHOUN ON 02-04-14    Demerol [Meperidine] Other (See Comments)    Mood changes w/hallucinations.   Tape Other (See Comments)    Blisters with tegaderm      The results of significant diagnostics from this hospitalization (including imaging, microbiology, ancillary and laboratory) are listed below for reference.    Significant Diagnostic Studies: DG Foot Complete Right Result Date: 08/28/2023 CLINICAL DATA:  Right foot pain and swelling. Random onset of right foot and leg numbness. EXAM: RIGHT FOOT COMPLETE - 3+ VIEW COMPARISON:  None Available. FINDINGS: Mildly decreased bone mineralization. Mild second DIP joint space narrowing and subchondral sclerosis with chronic lateral angulation of the distal phalanx of the second toe. Mild medial great toe metatarsal head degenerative spurring. Mild first through fifth tarsometatarsal subchondral sclerosis. Mild dorsal tarsometatarsal degenerative osteophytosis on lateral view. Moderate posterior and mild to moderate plantar calcaneal heel spur. Well corticated chronic ossicle at the posterior dorsal aspect of the navicular, likely the sequela of remote trauma/degenerative change. Mild dorsal navicular-cuneiform degenerative osteophytosis with moderate navicular-cuneiform joint space narrowing. No acute fracture  or dislocation. IMPRESSION: 1. 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 2. Moderate posterior and mild to moderate plantar calcaneal heel spurs. Electronically Signed   By: Neita Garnet M.D.   On: 08/28/2023 16:54   DG Chest 2 View Result Date: 08/28/2023 CLINICAL DATA:  Shortness of breath. EXAM: CHEST - 2 VIEW COMPARISON:  06/30/2023. FINDINGS: Bilateral lung fields are clear. Bilateral costophrenic angles are clear. Stable mildly enlarged cardio-mediastinal silhouette, which is likely accentuated by AP technique. No acute osseous abnormalities. Right reverse shoulder arthroplasty noted. The soft tissues are within normal limits. Note is made of bilateral breast implants. IMPRESSION: No active cardiopulmonary disease. Electronically Signed   By: Jules Schick M.D.   On: 08/28/2023 16:18   CT Head Wo Contrast Result Date: 08/28/2023 CLINICAL DATA:  Provided history: Mental status change, unknown cause. Additional history provided: Bilateral facial numbness, right leg/foot numbness. Recent back injury. Generalized weakness on neuro exam. EXAM: CT HEAD WITHOUT CONTRAST TECHNIQUE: Contiguous axial images were obtained from the base of the skull through the vertex without intravenous contrast. RADIATION DOSE REDUCTION: This exam was performed according to the departmental dose-optimization program  which includes automated exposure control, adjustment of the mA and/or kV according to patient size and/or use of iterative reconstruction technique. COMPARISON:  Head CT 05/01/2022.  Brain MRI 11/25/2014. FINDINGS: Streak/beam hardening artifact arising from dental restoration obscures portions of the supratentorial brain. Within this limitation, findings are as follows. Brain: Mild-to-moderate patchy and ill-defined hypoattenuation within the cerebral white matter, nonspecific but compatible with chronic small vessel disease. Cavum septum pellucidum and cavum vergae (anatomic variant). Partially empty sella turcica.  There is no acute intracranial hemorrhage. No demarcated cortical infarct. No extra-axial fluid collection. No evidence of an intracranial mass. No midline shift. Vascular: No hyperdense vessel.  Atherosclerotic calcifications. Skull: No calvarial fracture or aggressive osseous lesion. Sinuses/Orbits: No mass or acute finding within the imaged orbits. Mild mucosal thickening within the left maxillary sinus. Mild mucosal thickening within the left maxillary and bilateral sphenoid sinuses. Other: Nasal septal defect. IMPRESSION: 1. Streak/beam hardening artifact arising from dental restoration obscures portions of the supratentorial brain. 2. Within this limitation, there is no evidence of an acute intracranial abnormality. 3. Mild-to-moderate chronic small vessel ischemic changes within the cerebral white matter. 4. Mild paranasal sinus mucosal thickening. 5. Nasal septal defect. Electronically Signed   By: Jackey Loge D.O.   On: 08/28/2023 14:32    Microbiology: Recent Results (from the past 240 hours)  Resp panel by RT-PCR (RSV, Flu A&B, Covid) Anterior Nasal Swab     Status: None   Collection Time: 08/28/23 11:56 AM   Specimen: Anterior Nasal Swab  Result Value Ref Range Status   SARS Coronavirus 2 by RT PCR NEGATIVE NEGATIVE Final   Influenza A by PCR NEGATIVE NEGATIVE Final   Influenza B by PCR NEGATIVE NEGATIVE Final    Comment: (NOTE) The Xpert Xpress SARS-CoV-2/FLU/RSV plus assay is intended as an aid in the diagnosis of influenza from Nasopharyngeal swab specimens and should not be used as a sole basis for treatment. Nasal washings and aspirates are unacceptable for Xpert Xpress SARS-CoV-2/FLU/RSV testing.  Fact Sheet for Patients: BloggerCourse.com  Fact Sheet for Healthcare Providers: SeriousBroker.it  This test is not yet approved or cleared by the [place] FDA and has been authorized for detection and/or diagnosis of  SARS-CoV-2 by FDA under an Emergency Use Authorization (EUA). This EUA will remain in effect (meaning this test can be used) for the duration of the COVID-19 declaration under Section 564(b)(1) of the Act, 21 U.S.C. section 360bbb-3(b)(1), unless the authorization is terminated or revoked.     Resp Syncytial Virus by PCR NEGATIVE NEGATIVE Final    Comment: (NOTE) Fact Sheet for Patients: BloggerCourse.com  Fact Sheet for Healthcare Providers: SeriousBroker.it  This test is not yet approved or cleared by the [place] FDA and has been authorized for detection and/or diagnosis of SARS-CoV-2 by FDA under an Emergency Use Authorization (EUA). This EUA will remain in effect (meaning this test can be used) for the duration of the COVID-19 declaration under Section 564(b)(1) of the Act, 21 U.S.C. section 360bbb-3(b)(1), unless the authorization is terminated or revoked.  Performed at [facility], [address] [place], [place] [address]      Labs: Basic Metabolic Panel: Recent Labs  Lab 08/28/23 1213 08/28/23 1951 08/29/23 0528 08/30/23 0636 08/30/23 1651 08/31/23 0627 08/31/23 1639 09/01/23 0456  NA 137  --  140 138  --  139  --  139  K 2.7*  --  2.8* 4.1 4.1 3.7 4.0 4.2  CL 98  --  98 102  --  103  --  101  CO2 21*  --  24 26  --  25  --  23  GLUCOSE 119*  --  109* 115*  --  122*  --  124*  BUN 22  --  18 15  --  11  --  14  CREATININE 1.46*  --  1.19* 0.99  --  0.80  --  0.93  CALCIUM 6.2* 6.2* 6.7* 7.0*  --  7.8*  --  8.7*  MG 0.6*  --  1.4* 1.6* 2.4 1.6* 2.3 1.7  PHOS  --   --   --  2.3* 1.5* 2.3* 1.3* 2.0*   Liver Function Tests: Recent Labs  Lab 08/28/23 1213 08/29/23 0528 08/30/23 0636 08/31/23 0627 09/01/23 0456  AST 16 14* 21 20 24   ALT 10 10 15 15 17   ALKPHOS 53 47 49 43 44  BILITOT 1.9* 1.8* 1.3* 1.0 0.9  PROT 6.5 5.7* 5.8* 5.8* 6.0*  ALBUMIN 2.9* 2.6* 2.6* 2.6* 2.8*   No results for  input(s): "LIPASE", "AMYLASE" in the last 168 hours. No results for input(s): "AMMONIA" in the last 168 hours. CBC: Recent Labs  Lab 08/28/23 1213 08/29/23 0528  WBC 13.4* 9.7  NEUTROABS 11.3*  --   HGB 13.2 12.0  HCT 38.5 34.7*  MCV 90.6 89.9  PLT 295 272   Cardiac Enzymes: No results for input(s): "CKTOTAL", "CKMB", "CKMBINDEX", "TROPONINI" in the last 168 hours. BNP: BNP (last 3 results) No results for input(s): "BNP" in the last 8760 hours.  ProBNP (last 3 results) No results for input(s): "PROBNP" in the last 8760 hours.  CBG: No results for input(s): "GLUCAP" in the last 168 hours.     Signed:  Zannie Cove MD.  [facility] 09/01/2023, 9:26 AM

## 2023-09-01 NOTE — Care Management (Cosign Needed)
    Durable Medical Equipment  (From admission, onward)           Start     Ordered   09/01/23 0952  For home use only DME 3 n 1  Once        09/01/23 0951   09/01/23 0951  For home use only DME Walker rolling  Once       Question Answer Comment  Walker: With 5 Inch Wheels   Patient needs a walker to treat with the following condition Weakness      09/01/23 0951

## 2023-09-01 NOTE — TOC Transition Note (Signed)
Transition of Care [facility]) - Discharge Note   Patient Details  Name: Danielle Stephenson MRN: [id number] Date of Birth: 05-05-1946  [facility] CM/SW Contact:  Ronny Bacon, RN Phone Number: 09/01/2023, 9:49 AM   Clinical Narrative:   Patient being discharged today. Recommendations for RW and BSC/3:1 noted and ordered through [facility] with [employer] to be delivered to bedside.    Final next level of care: Home/Self Care Barriers to Discharge: No Barriers Identified   Patient Goals and CMS Choice            Discharge Placement                       Discharge Plan and Services Additional resources added to the After Visit Summary for                  DME Arranged: Walker rolling, 3-N-1 DME Agency: [employer] Date DME Agency Contacted: 09/01/23 Time DME Agency Contacted: [phone] Representative spoke with at DME Agency: Vaughan Basta            Social Drivers of Health (SDOH) Interventions SDOH Screenings   Food Insecurity: No Food Insecurity (08/28/2023)  Housing: Patient Declined (08/28/2023)  Transportation Needs: Patient Declined (08/28/2023)  Utilities: Patient Declined (08/28/2023)  Financial Resource Strain: Low Risk  (08/09/2023)   Received from [facility]  Physical Activity: Insufficiently Active (12/18/2022)   Received from [facility], [facility]  Social Connections: Moderately Integrated (08/28/2023)  Stress: No Stress Concern Present (12/18/2022)   Received from [facility], [facility]  Tobacco Use: Low Risk  (08/28/2023)     Readmission Risk Interventions    07/02/2023    3:42 PM  Readmission Risk Prevention Plan  Transportation Screening Complete  PCP or Specialist Appt within 5-7 Days Complete  Home Care Screening Complete  Medication Review (RN CM) Complete

## 2024-03-21 ENCOUNTER — Encounter (HOSPITAL_COMMUNITY): Payer: Self-pay | Admitting: *Deleted

## 2024-03-21 ENCOUNTER — Emergency Department (HOSPITAL_COMMUNITY)

## 2024-03-21 ENCOUNTER — Other Ambulatory Visit: Payer: Self-pay

## 2024-03-21 ENCOUNTER — Emergency Department (HOSPITAL_COMMUNITY)
Admission: EM | Admit: 2024-03-21 | Discharge: 2024-03-21 | Disposition: A | Discharge status: Home / Self Care | Attending: Emergency Medicine | Admitting: Emergency Medicine

## 2024-03-21 DIAGNOSIS — Z79899 Other long term (current) drug therapy: Secondary | ICD-10-CM | POA: Diagnosis not present

## 2024-03-21 DIAGNOSIS — E119 Type 2 diabetes mellitus without complications: Secondary | ICD-10-CM | POA: Insufficient documentation

## 2024-03-21 DIAGNOSIS — R112 Nausea with vomiting, unspecified: Secondary | ICD-10-CM | POA: Insufficient documentation

## 2024-03-21 DIAGNOSIS — R197 Diarrhea, unspecified: Secondary | ICD-10-CM | POA: Insufficient documentation

## 2024-03-21 DIAGNOSIS — I493 Ventricular premature depolarization: Secondary | ICD-10-CM | POA: Insufficient documentation

## 2024-03-21 DIAGNOSIS — I10 Essential (primary) hypertension: Secondary | ICD-10-CM | POA: Insufficient documentation

## 2024-03-21 DIAGNOSIS — E860 Dehydration: Secondary | ICD-10-CM | POA: Insufficient documentation

## 2024-03-21 LAB — COMPREHENSIVE METABOLIC PANEL WITH GFR
ALT: 14 U/L (ref 0–44)
AST: 23 U/L (ref 15–41)
Albumin: 3.9 g/dL (ref 3.5–5.0)
Alkaline Phosphatase: 69 U/L (ref 38–126)
Anion gap: 15 (ref 5–15)
BUN: 13 mg/dL (ref 8–23)
CO2: 21 mmol/L — ABNORMAL LOW (ref 22–32)
Calcium: 9.5 mg/dL (ref 8.9–10.3)
Chloride: 104 mmol/L (ref 98–111)
Creatinine, Ser: 1.19 mg/dL — ABNORMAL HIGH (ref 0.44–1.00)
GFR, Estimated: 48 mL/min — ABNORMAL LOW (ref >60)
Glucose, Bld: 126 mg/dL — ABNORMAL HIGH (ref 70–99)
Potassium: 3.4 mmol/L — ABNORMAL LOW (ref 3.5–5.1)
Sodium: 140 mmol/L (ref 135–145)
Total Bilirubin: 1.3 mg/dL — ABNORMAL HIGH (ref 0.0–1.2)
Total Protein: 7.4 g/dL (ref 6.5–8.1)

## 2024-03-21 LAB — CBC
HCT: 46.8 % — ABNORMAL HIGH (ref 36.0–46.0)
Hemoglobin: 15.5 g/dL — ABNORMAL HIGH (ref 12.0–15.0)
MCH: 30.3 pg (ref 26.0–34.0)
MCHC: 33.1 g/dL (ref 30.0–36.0)
MCV: 91.6 fL (ref 80.0–100.0)
Platelets: 359 K/uL (ref 150–400)
RBC: 5.11 MIL/uL (ref 3.87–5.11)
RDW: 12.5 % (ref 11.5–15.5)
WBC: 9.2 K/uL (ref 4.0–10.5)
nRBC: 0 % (ref 0.0–0.2)

## 2024-03-21 LAB — URINALYSIS, ROUTINE W REFLEX MICROSCOPIC
Bilirubin Urine: NEGATIVE
Glucose, UA: NEGATIVE mg/dL
Hgb urine dipstick: NEGATIVE
Ketones, ur: 5 mg/dL — AB
Leukocytes,Ua: NEGATIVE
Nitrite: NEGATIVE
Protein, ur: 100 mg/dL — AB
Specific Gravity, Urine: 1.023 (ref 1.005–1.030)
pH: 5 (ref 5.0–8.0)

## 2024-03-21 LAB — MAGNESIUM: Magnesium: 1.7 mg/dL (ref 1.7–2.4)

## 2024-03-21 LAB — TROPONIN I (HIGH SENSITIVITY)
Troponin I (High Sensitivity): 41 ng/L — ABNORMAL HIGH
Troponin I (High Sensitivity): 43 ng/L — ABNORMAL HIGH (ref <18)

## 2024-03-21 LAB — CBG MONITORING, ED: Glucose-Capillary: 130 mg/dL — ABNORMAL HIGH (ref 70–99)

## 2024-03-21 LAB — LIPASE, BLOOD: Lipase: 24 U/L (ref 11–51)

## 2024-03-21 MED ORDER — ONDANSETRON HCL 4 MG/2ML IJ SOLN
4.0000 mg | Freq: Once | INTRAMUSCULAR | Status: AC
  Administered 2024-03-21: 4 mg via INTRAVENOUS
  Filled 2024-03-21: qty 2

## 2024-03-21 MED ORDER — SODIUM CHLORIDE 0.9 % IV BOLUS
1000.0000 mL | Freq: Once | INTRAVENOUS | Status: AC
  Administered 2024-03-21: 1000 mL via INTRAVENOUS

## 2024-03-21 MED ORDER — MAGNESIUM SULFATE 2 GM/50ML IV SOLN
2.0000 g | Freq: Once | INTRAVENOUS | Status: AC
  Administered 2024-03-21: 2 g via INTRAVENOUS
  Filled 2024-03-21: qty 50

## 2024-03-21 MED ORDER — ONDANSETRON 4 MG PO TBDP: 4.0000 mg | ORAL_TABLET | Freq: Three times a day (TID) | ORAL | 0 refills | Status: AC | PRN

## 2024-03-21 NOTE — ED Triage Notes (Signed)
 Patient c/o dehydration, emesis, and diarrhea x 3 days, initially diarrhea was black, now orange. Patient feels not with it, has hx of electrolyte problems causing confusion.

## 2024-03-21 NOTE — ED Provider Notes (Signed)
 [place] EMERGENCY DEPARTMENT AT [facility] Provider Note   CSN: [id number] Arrival date & time: 03/21/24  1300     Patient presents with: Emesis and Diarrhea   Danielle Stephenson is a 78 y.o. female.   Pt is a 78 yo female with pmhx significant for htn, diverticulitis, obesity, depression, htn, gout, sleep apnea, dm, arthritis, and hx sbo.  Pt has had n/v/d for 3 days.  She has been unable to eat/drink anything.  She said if she tries to drink, she has diarrhea.  Pt feels out of it.  She did recently take meds for gout, but has stopped taking them and still has the diarrhea.  She was admitted in the past (Feb 2025) for a similar problem and her k and mg were very low.  She is concerned this is the case again.       Prior to Admission medications   Medication Sig Start Date End Date Taking? Authorizing Provider  ondansetron  (ZOFRAN -ODT) 4 MG disintegrating tablet Take 1 tablet (4 mg total) by mouth every 8 (eight) hours as needed. 03/21/24  Yes Dean Clarity, MD  acetaminophen  (TYLENOL ) 500 MG tablet Take 1,000 mg by mouth as needed.    [provider]  albuterol  (VENTOLIN  HFA) 108 (90 Base) MCG/ACT inhaler Inhale 1-2 puffs into the lungs every 4 (four) hours as needed for wheezing.  Patient not taking: Reported on 08/28/2023    [provider]  buPROPion  (WELLBUTRIN  XL) 150 MG 24 hr tablet Take 1 tablet by mouth every evening. 06/28/23   [provider]  Cholecalciferol  (VITAMIN D ) 2000 UNITS tablet Take 2,000 Units by mouth daily.    [provider]  FLUoxetine  (PROZAC ) 40 MG capsule Take 40 mg by mouth in the morning. 01/08/22   [provider]  Fluticasone-Salmeterol (ADVAIR) 500-50 MCG/DOSE AEPB Inhale 1 puff into the lungs daily as needed. Patient not taking: Reported on 08/28/2023    [provider]  levonorgestrel  (MIRENA ) 20 MCG/24HR IUD 1 Intra Uterine Device (1 each total) by Intrauterine route once for 1 dose. To be  placed in OR Patient taking differently: 1 each by Intrauterine route continuous. 06/05/19 08/28/23  Cross, Melissa D, NP  losartan  (COZAAR ) 25 MG tablet Take 1 tablet (25 mg total) by mouth daily. 09/01/23   Fairy Frames, MD  metoprolol  tartrate (LOPRESSOR ) 25 MG tablet Take 0.5 tablets (12.5 mg total) by mouth 2 (two) times daily. 09/01/23   Fairy Frames, MD  montelukast  (SINGULAIR ) 5 MG chewable tablet Chew 5 mg by mouth daily. 04/28/19   [provider]  Multiple Vitamins-Minerals (MULTIVITAMIN WITH MINERALS) tablet Take 1 tablet by mouth daily.    [provider]  rosuvastatin  (CRESTOR ) 20 MG tablet Take 20 mg by mouth daily.    [provider]    Allergies: Penicillins, Ciprofloxacin, Iodine, Demerol [meperidine], and Tape    Review of Systems  Gastrointestinal:  Positive for diarrhea, nausea and vomiting.  All other systems reviewed and are negative.   Updated Vital Signs BP (!) 157/91   Pulse 98   Temp 97.6 F (36.4 C)   Resp 19   Ht 5' 5 (1.651 m)   Wt (!) 140.8 kg   SpO2 96%   BMI 51.65 kg/m   Physical Exam Vitals and nursing note reviewed.  Constitutional:      Appearance: Normal appearance. She is obese.  HENT:     Head: Normocephalic and atraumatic.     Right Ear: External  ear normal.     Left Ear: External ear normal.     Nose: Nose normal.     Mouth/Throat:     Mouth: Mucous membranes are dry.  Eyes:     Extraocular Movements: Extraocular movements intact.     Conjunctiva/sclera: Conjunctivae normal.     Pupils: Pupils are equal, round, and reactive to light.  Cardiovascular:     Rate and Rhythm: Regular rhythm. Tachycardia present.     Pulses: Normal pulses.     Heart sounds: Normal heart sounds.  Pulmonary:     Effort: Pulmonary effort is normal.     Breath sounds: Normal breath sounds.  Abdominal:     General: Abdomen is flat. Bowel sounds are normal.     Palpations: Abdomen is soft.  Musculoskeletal:         General: Normal range of motion.     Cervical back: Normal range of motion and neck supple.  Skin:    General: Skin is warm.     Capillary Refill: Capillary refill takes less than 2 seconds.  Neurological:     General: No focal deficit present.     Mental Status: She is alert and oriented to person, place, and time.  Psychiatric:        Mood and Affect: Mood normal.        Behavior: Behavior normal.     (all labs ordered are listed, but only abnormal results are displayed) Labs Reviewed  COMPREHENSIVE METABOLIC PANEL WITH GFR - Abnormal; Notable for the following components:      Result Value   Potassium 3.4 (*)    CO2 21 (*)    Glucose, Bld 126 (*)    Creatinine, Ser 1.19 (*)    Total Bilirubin 1.3 (*)    GFR, Estimated 48 (*)    All other components within normal limits  CBC - Abnormal; Notable for the following components:   Hemoglobin 15.5 (*)    HCT 46.8 (*)    All other components within normal limits  URINALYSIS, ROUTINE W REFLEX MICROSCOPIC - Abnormal; Notable for the following components:   Color, Urine AMBER (*)    APPearance CLOUDY (*)    Ketones, ur 5 (*)    Protein, ur 100 (*)    Bacteria, UA RARE (*)    All other components within normal limits  CBG MONITORING, ED - Abnormal; Notable for the following components:   Glucose-Capillary 130 (*)    All other components within normal limits  TROPONIN I (HIGH SENSITIVITY) - Abnormal; Notable for the following components:   Troponin I (High Sensitivity) 41 (*)    All other components within normal limits  TROPONIN I (HIGH SENSITIVITY) - Abnormal; Notable for the following components:   Troponin I (High Sensitivity) 43 (*)    All other components within normal limits  GASTROINTESTINAL PANEL BY PCR, STOOL (REPLACES STOOL CULTURE)  C DIFFICILE QUICK SCREEN W PCR REFLEX    LIPASE, BLOOD  MAGNESIUM   POC OCCULT BLOOD, ED    EKG: EKG Interpretation Date/Time:  Saturday March 21 2024 14:18:23 EDT Ventricular  Rate:  102 PR Interval:  145 QRS Duration:  138 QT Interval:  396 QTC Calculation: 437 R Axis:   243  Text Interpretation: Right and left arm electrode reversal, interpretation assumes no reversal Sinus tachycardia Ventricular bigeminy Nonspecific IVCD with LAD Abnormal T, consider ischemia, lateral leads ST elevation, consider inferior injury Confirmed by Dean Clarity [phone]) on 03/21/2024 2:26:47 PM  Radiology: CT ABDOMEN  PELVIS WO CONTRAST Result Date: 03/21/2024 CLINICAL DATA:  Diarrhea, dehydration, emesis EXAM: CT ABDOMEN AND PELVIS WITHOUT CONTRAST TECHNIQUE: Multidetector CT imaging of the abdomen and pelvis was performed following the standard protocol without IV contrast. RADIATION DOSE REDUCTION: This exam was performed according to the departmental dose-optimization program which includes automated exposure control, adjustment of the mA and/or kV according to patient size and/or use of iterative reconstruction technique. COMPARISON:  04/17/2019, 07/02/2023 FINDINGS: Lower chest: No acute pleural or parenchymal lung disease. Dense calcification of the mitral annulus again noted. Hepatobiliary: Cholecystectomy. Unremarkable unenhanced appearance of the liver. No biliary duct dilation. Pancreas: Unremarkable unenhanced appearance. Spleen: Unremarkable unenhanced appearance. Adrenals/Urinary Tract: The adrenals are unremarkable. 3 mm calcification within the mid right kidney, favor vascular over nonobstructing calculus. No other urinary tract calculi or obstructive uropathy within either kidney. Bladder is decompressed, limiting its evaluation. Stomach/Bowel: No bowel obstruction or ileus. The appendix is surgically absent. Prior sigmoid colon resection and reanastomosis. Scattered colonic diverticulosis without diverticulitis. Postsurgical changes from prior bariatric surgery. Vascular/Lymphatic: Aortic atherosclerosis. No enlarged abdominal or pelvic lymph nodes. Reproductive: Uterus is  atrophic. IUD within the endometrial cavity. No adnexal masses. Other: No free fluid or free intraperitoneal gas. Midline ventral abdominal hernias are again noted, containing small portions of the transverse colon as before. No evidence of obstruction, incarceration, or ischemia. Musculoskeletal: No acute or destructive bony abnormalities. Reconstructed images demonstrate no additional findings. IMPRESSION: 1. No acute intra-abdominal or intrapelvic process. 2. Stable midline ventral hernias containing portions of the transverse colon. No obstruction, incarceration, or ischemia. 3. Scattered colonic diverticulosis without diverticulitis. 4.  Aortic Atherosclerosis (ICD10-I70.0). Electronically Signed   By: Ozell Daring M.D.   On: 03/21/2024 15:40     Procedures   Medications Ordered in the ED  sodium chloride  0.9 % bolus 1,000 mL (0 mLs Intravenous Stopped 03/21/24 1545)  ondansetron  (ZOFRAN ) injection 4 mg (4 mg Intravenous Given 03/21/24 1441)  sodium chloride  0.9 % bolus 1,000 mL (0 mLs Intravenous Stopped 03/21/24 1911)  magnesium  sulfate IVPB 2 g 50 mL (0 g Intravenous Stopped 03/21/24 1911)  sodium chloride  0.9 % bolus 1,000 mL (1,000 mLs Intravenous New Bag/Given 03/21/24 2031)                                    Medical Decision Making Amount and/or Complexity of Data Reviewed Labs: ordered. Radiology: ordered.  Risk Prescription drug management.   This patient presents to the ED for concern of diarrhea, this involves an extensive number of treatment options, and is a complaint that carries with it a high risk of complications and morbidity.  The differential diagnosis includes colitis, electrolyte abn, dehydration, infection   Co morbidities that complicate the patient evaluation   htn, diverticulitis, obesity, depression, htn, gout, sleep apnea, dm, arthritis, and hx sbo   Additional history obtained:  Additional history obtained from epic chart review External records from  outside source obtained and reviewed including friend   Lab Tests:  I Ordered, and personally interpreted labs.  The pertinent results include:  cbc with hgb 15.5; cmp with k sl low at 3.4, cr sl elevated at 1.19 (0.93 in feb); lip nl; ua nl   Imaging Studies ordered:  I ordered imaging studies including ct abd/pelvis  I independently visualized and interpreted imaging which showed  . No acute intra-abdominal or intrapelvic process.  2. Stable midline ventral hernias containing portions of the  transverse colon. No obstruction, incarceration, or ischemia.  3. Scattered colonic diverticulosis without diverticulitis.  4.  Aortic Atherosclerosis (ICD10-I70.0).   I agree with the radiologist interpretation   Cardiac Monitoring:  The patient was maintained on a cardiac monitor.  I personally viewed and interpreted the cardiac monitored which showed an underlying rhythm of: st with pvcs   Medicines ordered and prescription drug management:  I ordered medication including ivfs/zofran /mg  for sx  Reevaluation of the patient after these medicines showed that the patient improved I have reviewed the patients home medicines and have made adjustments as needed   Test Considered:  ct   Critical Interventions:  ivfs  Problem List / ED Course:  Dehydration:  pt has improved and she is able to tolerate po fluids. Diarrhea:  no diarrhea while here.  No evidence of colitis on CT.   Home situation:  pt said her 2 daughters are not helping her at all.  She has a difficult time walking due to her back and lives upstairs.  Sometimes, she can't get to the fridge or to the sink.  So, I have ordered home health.  Pt also has food instability.  She can't get to the food pantry due to ambulatory dysfunction and lack of driving.  Hopefully the sw can help with that.   Reevaluation:  After the interventions noted above, I reevaluated the patient and found that they have :improved   Social  Determinants of Health:  Lives at home   Dispostion:  After consideration of the diagnostic results and the patients response to treatment, I feel that the patent would benefit from discharge with outpatient f/u.       Final diagnoses:  Diarrhea, unspecified type  Dehydration  PVC's (premature ventricular contractions)    ED Discharge Orders          Ordered    ondansetron  (ZOFRAN -ODT) 4 MG disintegrating tablet  Every 8 hours PRN        03/21/24 2054               Dean Clarity, MD 03/21/24 2056

## 2024-03-21 NOTE — ED Notes (Signed)
 Pt transported to CT ?

## 2024-03-21 NOTE — ED Notes (Signed)
 Pt ambulated to restroom with a Danielle Stephenson and required minimal assistance. Pt stated they initially felt lightheaded but wanted to continuing walking. After the restroom the pt stated they felt better.

## 2024-03-21 NOTE — ED Notes (Signed)
 Per haviland md, pt was given swallow challenge passed without issue.

## 2024-03-22 NOTE — Care Management (Signed)
 0800 03/22/2024 Patient  was in the ED last night , home health ordered at that time . Called [facility] and cheryl Rose accepted for home health. She will call the patient to set up services
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
## 2024-07-22 ENCOUNTER — Ambulatory Visit: Admitting: Gastroenterology
